# Patient Record
Sex: Female | Born: 1935 | Race: White | Hispanic: No | Smoking: Never smoker
Health system: Southern US, Community
[De-identification: ages and names within clinical notes are randomized; demographics above are authoritative.]

## PROBLEM LIST (undated history)

## (undated) DIAGNOSIS — K449 Diaphragmatic hernia without obstruction or gangrene: Secondary | ICD-10-CM

## (undated) DIAGNOSIS — B9681 Helicobacter pylori [H. pylori] as the cause of diseases classified elsewhere: Secondary | ICD-10-CM

## (undated) DIAGNOSIS — M48061 Spinal stenosis, lumbar region without neurogenic claudication: Secondary | ICD-10-CM

## (undated) DIAGNOSIS — D131 Benign neoplasm of stomach: Secondary | ICD-10-CM

## (undated) DIAGNOSIS — B029 Zoster without complications: Secondary | ICD-10-CM

## (undated) DIAGNOSIS — M1990 Unspecified osteoarthritis, unspecified site: Secondary | ICD-10-CM

## (undated) DIAGNOSIS — C801 Malignant (primary) neoplasm, unspecified: Secondary | ICD-10-CM

## (undated) DIAGNOSIS — K219 Gastro-esophageal reflux disease without esophagitis: Secondary | ICD-10-CM

## (undated) DIAGNOSIS — I10 Essential (primary) hypertension: Secondary | ICD-10-CM

## (undated) DIAGNOSIS — E119 Type 2 diabetes mellitus without complications: Secondary | ICD-10-CM

## (undated) DIAGNOSIS — F32A Depression, unspecified: Secondary | ICD-10-CM

## (undated) DIAGNOSIS — K2970 Gastritis, unspecified, without bleeding: Secondary | ICD-10-CM

## (undated) DIAGNOSIS — D126 Benign neoplasm of colon, unspecified: Secondary | ICD-10-CM

## (undated) HISTORY — PX: OTHER SURGICAL HISTORY: SHX169

## (undated) HISTORY — DX: Benign neoplasm of colon, unspecified: D12.6

## (undated) HISTORY — PX: LUMBAR LAMINECTOMY: SHX95

## (undated) HISTORY — PX: RETINAL LASER PROCEDURE: SHX2339

## (undated) HISTORY — DX: Benign neoplasm of stomach: D13.1

## (undated) HISTORY — DX: Depression, unspecified: F32.A

## (undated) HISTORY — DX: Type 2 diabetes mellitus without complications: E11.9

## (undated) HISTORY — PX: BREAST EXCISIONAL BIOPSY: SUR124

## (undated) HISTORY — DX: Gastro-esophageal reflux disease without esophagitis: K21.9

## (undated) HISTORY — PX: TOTAL KNEE ARTHROPLASTY: SHX125

## (undated) HISTORY — PX: VAGINAL HYSTERECTOMY: SUR661

## (undated) HISTORY — DX: Zoster without complications: B02.9

## (undated) HISTORY — DX: Unspecified osteoarthritis, unspecified site: M19.90

## (undated) HISTORY — PX: TONSILLECTOMY: SUR1361

## (undated) HISTORY — DX: Essential (primary) hypertension: I10

## (undated) HISTORY — PX: ROTATOR CUFF REPAIR: SHX139

## (undated) HISTORY — DX: Helicobacter pylori (H. pylori) as the cause of diseases classified elsewhere: B96.81

## (undated) HISTORY — PX: CARPAL TUNNEL RELEASE: SHX101

## (undated) HISTORY — PX: HEMORRHOID SURGERY: SHX153

## (undated) HISTORY — PX: CATARACT EXTRACTION: SUR2

## (undated) HISTORY — DX: Spinal stenosis, lumbar region without neurogenic claudication: M48.061

## (undated) HISTORY — DX: Gastritis, unspecified, without bleeding: K29.70

## (undated) SURGERY — Surgical Case
Anesthesia: *Unknown

---

## 1996-10-16 HISTORY — PX: CHOLECYSTECTOMY: SHX55

## 1998-06-26 ENCOUNTER — Inpatient Hospital Stay (HOSPITAL_COMMUNITY): Admission: RE | Admit: 1998-06-26 | Discharge: 1998-06-28 | Payer: Self-pay | Admitting: Orthopedic Surgery

## 1998-08-31 ENCOUNTER — Other Ambulatory Visit: Admission: RE | Admit: 1998-08-31 | Discharge: 1998-08-31 | Payer: Self-pay | Admitting: Internal Medicine

## 1999-09-14 ENCOUNTER — Encounter: Admission: RE | Admit: 1999-09-14 | Discharge: 1999-09-14 | Payer: Self-pay | Admitting: Internal Medicine

## 1999-09-14 ENCOUNTER — Encounter: Payer: Self-pay | Admitting: Internal Medicine

## 1999-09-22 ENCOUNTER — Other Ambulatory Visit: Admission: RE | Admit: 1999-09-22 | Discharge: 1999-09-22 | Payer: Self-pay | Admitting: Internal Medicine

## 2000-09-14 ENCOUNTER — Encounter: Admission: RE | Admit: 2000-09-14 | Discharge: 2000-09-14 | Payer: Self-pay | Admitting: Internal Medicine

## 2000-09-14 ENCOUNTER — Encounter: Payer: Self-pay | Admitting: Internal Medicine

## 2000-09-18 ENCOUNTER — Other Ambulatory Visit: Admission: RE | Admit: 2000-09-18 | Discharge: 2000-09-18 | Payer: Self-pay | Admitting: Internal Medicine

## 2000-11-23 ENCOUNTER — Encounter (INDEPENDENT_AMBULATORY_CARE_PROVIDER_SITE_OTHER): Payer: Self-pay

## 2000-11-23 ENCOUNTER — Ambulatory Visit (HOSPITAL_COMMUNITY): Admission: RE | Admit: 2000-11-23 | Discharge: 2000-11-23 | Payer: Self-pay | Admitting: *Deleted

## 2001-04-01 ENCOUNTER — Ambulatory Visit (HOSPITAL_COMMUNITY): Admission: RE | Admit: 2001-04-01 | Discharge: 2001-04-02 | Payer: Self-pay | Admitting: Ophthalmology

## 2001-04-01 ENCOUNTER — Encounter: Payer: Self-pay | Admitting: Ophthalmology

## 2001-10-22 ENCOUNTER — Encounter: Payer: Self-pay | Admitting: Ophthalmology

## 2001-10-23 ENCOUNTER — Ambulatory Visit (HOSPITAL_COMMUNITY): Admission: RE | Admit: 2001-10-23 | Discharge: 2001-10-24 | Payer: Self-pay | Admitting: Ophthalmology

## 2001-12-09 ENCOUNTER — Encounter: Admission: RE | Admit: 2001-12-09 | Discharge: 2001-12-09 | Payer: Self-pay | Admitting: Internal Medicine

## 2001-12-09 ENCOUNTER — Encounter: Payer: Self-pay | Admitting: Internal Medicine

## 2001-12-17 ENCOUNTER — Other Ambulatory Visit: Admission: RE | Admit: 2001-12-17 | Discharge: 2001-12-17 | Payer: Self-pay | Admitting: Internal Medicine

## 2003-01-20 ENCOUNTER — Encounter: Payer: Self-pay | Admitting: Internal Medicine

## 2003-01-20 ENCOUNTER — Encounter: Admission: RE | Admit: 2003-01-20 | Discharge: 2003-01-20 | Payer: Self-pay | Admitting: Internal Medicine

## 2003-01-27 ENCOUNTER — Other Ambulatory Visit: Admission: RE | Admit: 2003-01-27 | Discharge: 2003-01-27 | Payer: Self-pay | Admitting: Internal Medicine

## 2003-06-24 ENCOUNTER — Encounter: Payer: Self-pay | Admitting: Orthopedic Surgery

## 2003-06-25 ENCOUNTER — Observation Stay (HOSPITAL_COMMUNITY): Admission: RE | Admit: 2003-06-25 | Discharge: 2003-06-26 | Payer: Self-pay | Admitting: Orthopedic Surgery

## 2003-10-26 ENCOUNTER — Encounter: Admission: RE | Admit: 2003-10-26 | Discharge: 2003-10-26 | Payer: Self-pay | Admitting: Orthopedic Surgery

## 2003-11-10 ENCOUNTER — Encounter: Admission: RE | Admit: 2003-11-10 | Discharge: 2003-11-10 | Payer: Self-pay | Admitting: Orthopedic Surgery

## 2003-12-01 ENCOUNTER — Encounter: Admission: RE | Admit: 2003-12-01 | Discharge: 2003-12-01 | Payer: Self-pay | Admitting: Orthopedic Surgery

## 2003-12-31 ENCOUNTER — Inpatient Hospital Stay (HOSPITAL_COMMUNITY): Admission: RE | Admit: 2003-12-31 | Discharge: 2004-01-02 | Payer: Self-pay | Admitting: Orthopedic Surgery

## 2004-01-26 ENCOUNTER — Encounter (INDEPENDENT_AMBULATORY_CARE_PROVIDER_SITE_OTHER): Payer: Self-pay | Admitting: Specialist

## 2004-01-26 ENCOUNTER — Ambulatory Visit (HOSPITAL_COMMUNITY): Admission: RE | Admit: 2004-01-26 | Discharge: 2004-01-26 | Payer: Self-pay | Admitting: *Deleted

## 2004-03-08 ENCOUNTER — Ambulatory Visit (HOSPITAL_COMMUNITY): Admission: RE | Admit: 2004-03-08 | Discharge: 2004-03-08 | Payer: Self-pay | Admitting: Internal Medicine

## 2004-10-28 ENCOUNTER — Encounter: Admission: RE | Admit: 2004-10-28 | Discharge: 2004-10-28 | Payer: Self-pay | Admitting: Orthopedic Surgery

## 2004-12-13 ENCOUNTER — Observation Stay (HOSPITAL_COMMUNITY): Admission: RE | Admit: 2004-12-13 | Discharge: 2004-12-14 | Payer: Self-pay | Admitting: Orthopedic Surgery

## 2005-01-09 ENCOUNTER — Encounter: Admission: RE | Admit: 2005-01-09 | Discharge: 2005-01-09 | Payer: Self-pay | Admitting: Orthopedic Surgery

## 2005-04-08 ENCOUNTER — Encounter: Admission: RE | Admit: 2005-04-08 | Discharge: 2005-04-08 | Payer: Self-pay | Admitting: Orthopedic Surgery

## 2005-04-10 ENCOUNTER — Ambulatory Visit (HOSPITAL_COMMUNITY): Admission: RE | Admit: 2005-04-10 | Discharge: 2005-04-10 | Payer: Self-pay | Admitting: Internal Medicine

## 2005-06-13 IMAGING — CT CT L SPINE W/ CM
2 of 7 series · 10 of 20 positions shown, 12 images · non-contrast
Comparison: none

CLINICAL DATA: Back pain. 
 CT MULTIPLANAR RECONSTRUCTION - LUMBAR SPINE
 Multiplanar reformatted CT images were reconstructed from the axial CT data set. These images were reviewed and pertinent findings are included in the accompanying complete CT report. 

 IMPRESSION
 See complete CT report. 
 CT LUMBAR SPINE WITHOUT CONTRAST
TECHNIQUE: Initially lumbar myelography was attempted.  Attempts were made at the L3-4 and L2-3 levels.  After attempting at these levels, the patient could tolerate no further attempts at access into the intrathecal space.  Myelography was then abandoned and CT of the lumbar spine was performed without intrathecal contrast.  1 mm spiral images were obtained through the lumbar spine.  The images were reconstructed in the sagittal view as well as the axial views in the planes of the disks.

[Series 3: recon 2: · axial · 0.27mm/px · z∈[-9,+121]mm · 7 of 269 slices shown, 9 images]
[im 34/269  soft-tissue]
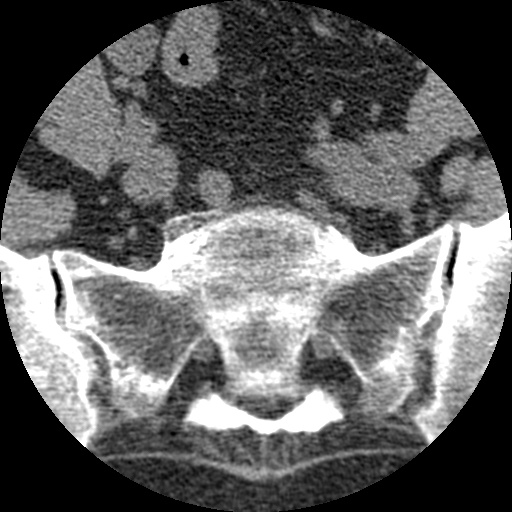
[im 34/269  bone]
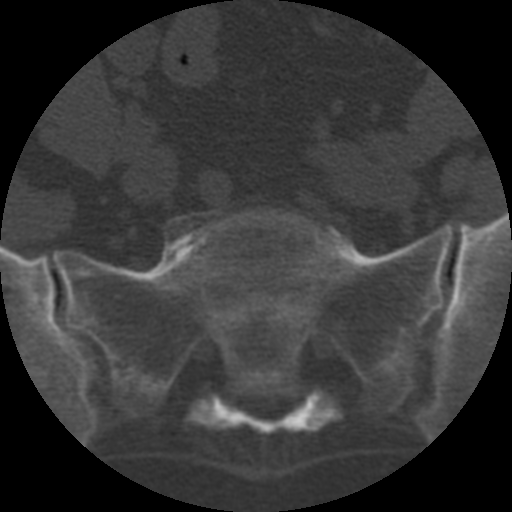
[im 68/269  bone]
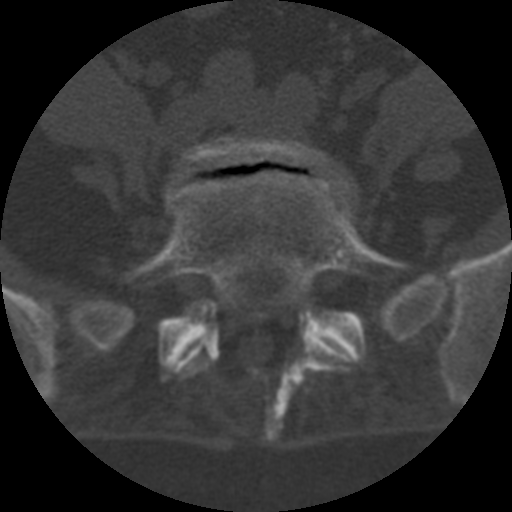
[im 101/269  bone]
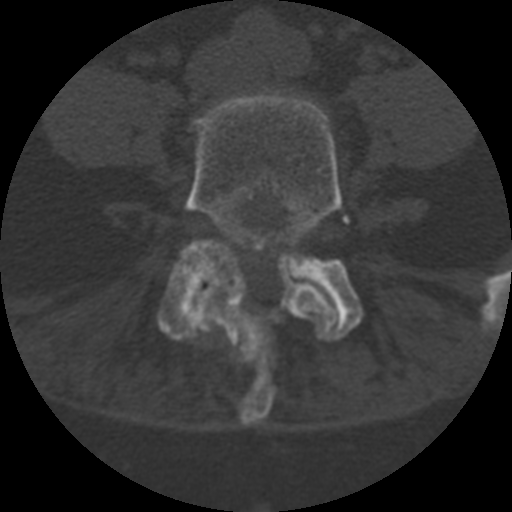
[im 135/269  bone]
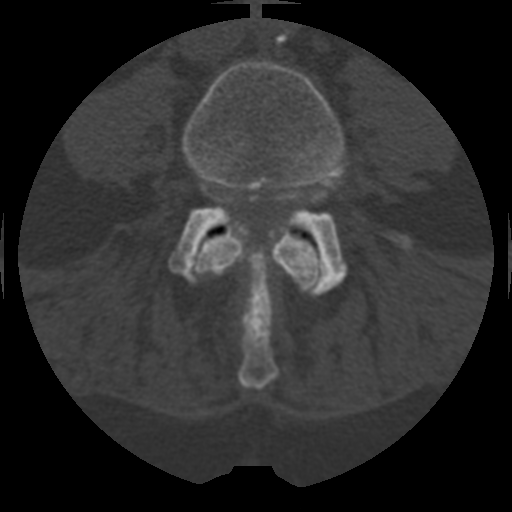
[im 168/269  soft-tissue]
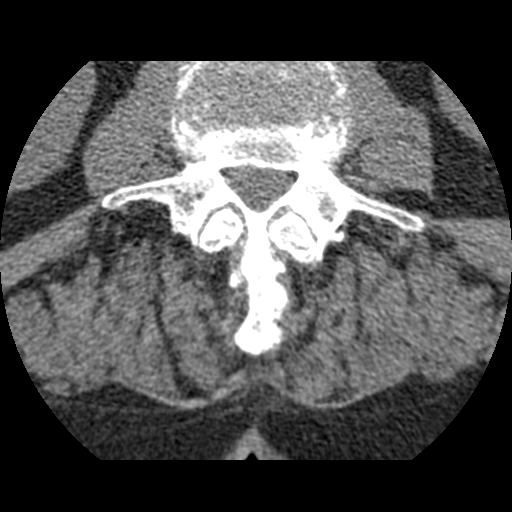
[im 168/269  bone]
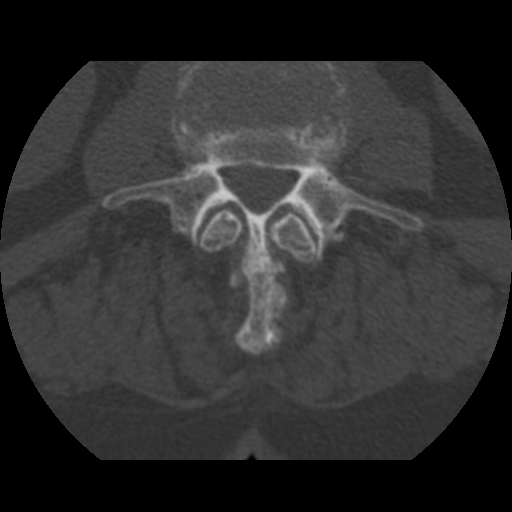
[im 202/269  bone]
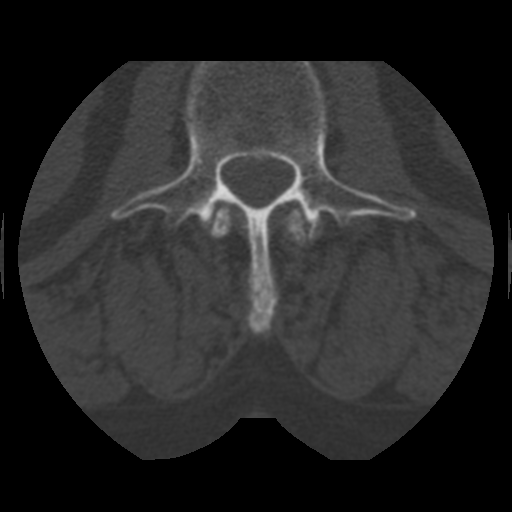
[im 235/269  bone]
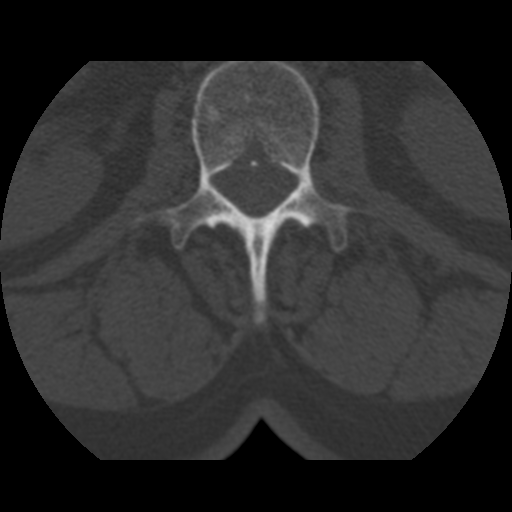

[Series 527: reformatted · sagittal · 0.35mm/px · 3 of 40 slices shown]
[im 8/40  bone]
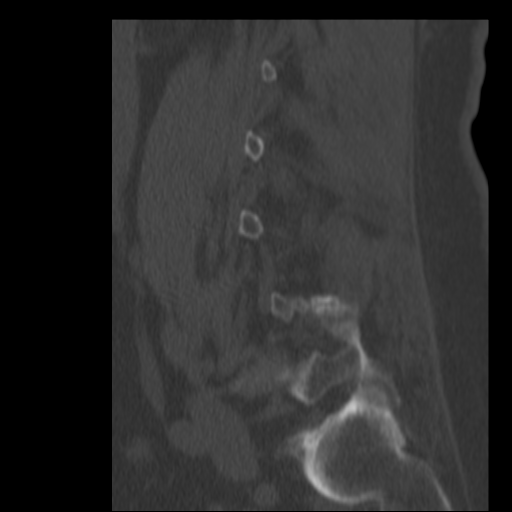
[im 16/40  bone]
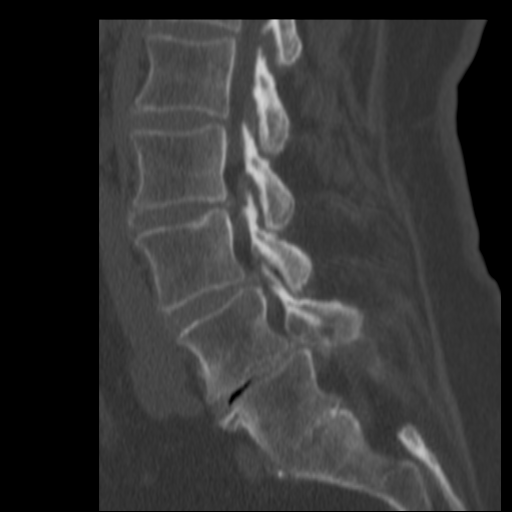
[im 24/40  bone]
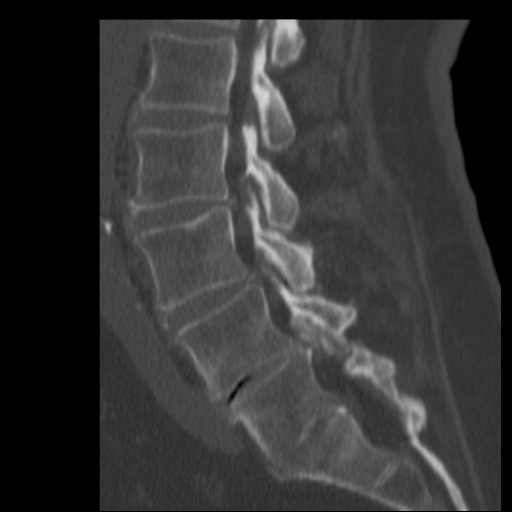

[10 of 20 positions shown; findings below may reference images not displayed]

FINDINGS: Mild narrowing at L1-2, L2-3, and L3-4 is seen.  Severe narrowing at L4-5 with vacuum disk phenomenon is seen.  Mild anterolisthesis of L4 upon L5 is seen approximately 2 to 3 mm.  L5-S1 is fused compatible with a transitional L5 vertebral body.  There is no vertebral body height loss in the lumbar spine.  The conus is not visualized.  
 L1-2:  No disk herniation, canal stenosis or neural foraminal stenosis.
 L2-3:  A significant concentric disk bulge is seen.  The disk is calcified with posterior osteophytic ridging.  There is also significant facet hypertrophy.  This results in severe multifactorial central canal stenosis as well as significant bilateral lateral recess stenosis.  Moderate bilateral neural foraminal stenosis is seen.  
 L3-4:  There is a moderate concentric disk bulge with bilateral facet hypertrophy.  This results in moderate to severe central canal stenosis and moderate bilateral lateral recess stenosis.  The disk is also somewhat peripherally calcified, but to a lesser degree than that of the L2-3 disk.  The central canal stenosis is somewhat less severe than at the L2-3 level.  Mild bilateral neural foraminal stenosis is seen.  
 L4-5:  There is severe desiccation of the disk.   There is also significant bilateral facet arthropathy.  This results in a severe right subarticular lateral recess stenosis and moderate left lateral recess stenosis.  Mild central canal stenosis is seen.  Moderate left neural foraminal stenosis is noted and severe right neural foraminal stenosis is seen predominantly due to disk osteophyte and facet osteophytes.  A left laminectomy defect is noted.
 L5-S1:  There is a mild concentric disk bulge.  A right laminectomy defect is noted. Significant facet arthropathy is noted bilaterally.  There is no central canal stenosis.  Moderate bilateral neural foraminal stenosis is noted.  
 IMPRESSION
 1.  Transitional vertebra at L5 as described.
 2.  Significant central canal stenosis and bilateral lateral recess stenosis at L2-3 and L3-4.  Bilateral neural foraminal stenosis
 is noted at these levels as well.
 3.  Minimal central canal stenosis at L4-5, but severe right neural foraminal stenosis and moderate left neural foraminal stenosis is a result of concentric disk bulge and facet arthropathy.
 4.  Post surgical changes. 
 5.  Compared to the MR, there has been no significant change.  The right L4-5 neural foraminal stenosis is better delineated due to calcification in the adjacent disk and facet osteophytes.

## 2005-07-26 ENCOUNTER — Ambulatory Visit: Admission: RE | Admit: 2005-07-26 | Discharge: 2005-07-26 | Payer: Self-pay | Admitting: *Deleted

## 2005-07-26 ENCOUNTER — Encounter (INDEPENDENT_AMBULATORY_CARE_PROVIDER_SITE_OTHER): Payer: Self-pay | Admitting: *Deleted

## 2005-10-18 ENCOUNTER — Inpatient Hospital Stay (HOSPITAL_COMMUNITY): Admission: RE | Admit: 2005-10-18 | Discharge: 2005-10-24 | Payer: Self-pay | Admitting: Orthopaedic Surgery

## 2006-03-12 ENCOUNTER — Emergency Department (HOSPITAL_COMMUNITY): Admission: EM | Admit: 2006-03-12 | Discharge: 2006-03-12 | Payer: Self-pay | Admitting: Internal Medicine

## 2006-05-21 ENCOUNTER — Ambulatory Visit (HOSPITAL_COMMUNITY): Admission: RE | Admit: 2006-05-21 | Discharge: 2006-05-21 | Payer: Self-pay | Admitting: Internal Medicine

## 2007-05-23 ENCOUNTER — Ambulatory Visit (HOSPITAL_COMMUNITY): Admission: RE | Admit: 2007-05-23 | Discharge: 2007-05-23 | Payer: Self-pay | Admitting: Internal Medicine

## 2007-06-24 ENCOUNTER — Encounter: Admission: RE | Admit: 2007-06-24 | Discharge: 2007-06-24 | Payer: Self-pay | Admitting: Orthopaedic Surgery

## 2007-06-26 ENCOUNTER — Other Ambulatory Visit: Admission: RE | Admit: 2007-06-26 | Discharge: 2007-06-26 | Payer: Self-pay | Admitting: Gynecology

## 2007-06-28 ENCOUNTER — Ambulatory Visit (HOSPITAL_BASED_OUTPATIENT_CLINIC_OR_DEPARTMENT_OTHER): Admission: RE | Admit: 2007-06-28 | Discharge: 2007-06-28 | Payer: Self-pay | Admitting: Orthopaedic Surgery

## 2007-09-30 ENCOUNTER — Emergency Department (HOSPITAL_COMMUNITY): Admission: EM | Admit: 2007-09-30 | Discharge: 2007-09-30 | Payer: Self-pay | Admitting: Emergency Medicine

## 2008-05-26 ENCOUNTER — Encounter: Admission: RE | Admit: 2008-05-26 | Discharge: 2008-05-26 | Payer: Self-pay | Admitting: Orthopaedic Surgery

## 2008-06-05 ENCOUNTER — Encounter: Admission: RE | Admit: 2008-06-05 | Discharge: 2008-06-05 | Payer: Self-pay | Admitting: Orthopaedic Surgery

## 2008-06-25 ENCOUNTER — Ambulatory Visit (HOSPITAL_COMMUNITY): Admission: RE | Admit: 2008-06-25 | Discharge: 2008-06-25 | Payer: Self-pay | Admitting: Internal Medicine

## 2008-11-09 ENCOUNTER — Ambulatory Visit: Payer: Self-pay | Admitting: Gynecology

## 2009-05-01 ENCOUNTER — Encounter: Admission: RE | Admit: 2009-05-01 | Discharge: 2009-05-01 | Payer: Self-pay | Admitting: Orthopaedic Surgery

## 2009-07-15 ENCOUNTER — Ambulatory Visit (HOSPITAL_COMMUNITY): Admission: RE | Admit: 2009-07-15 | Discharge: 2009-07-15 | Payer: Self-pay | Admitting: Internal Medicine

## 2009-10-22 ENCOUNTER — Inpatient Hospital Stay (HOSPITAL_COMMUNITY): Admission: RE | Admit: 2009-10-22 | Discharge: 2009-10-27 | Payer: Self-pay | Admitting: Orthopaedic Surgery

## 2009-12-21 ENCOUNTER — Ambulatory Visit: Payer: Self-pay | Admitting: Gynecology

## 2009-12-21 ENCOUNTER — Other Ambulatory Visit: Admission: RE | Admit: 2009-12-21 | Discharge: 2009-12-21 | Payer: Self-pay | Admitting: Gynecology

## 2010-02-22 ENCOUNTER — Ambulatory Visit: Payer: Self-pay | Admitting: Gynecology

## 2010-07-21 ENCOUNTER — Ambulatory Visit (HOSPITAL_COMMUNITY): Admission: RE | Admit: 2010-07-21 | Discharge: 2010-07-21 | Payer: Self-pay | Admitting: Internal Medicine

## 2010-09-16 ENCOUNTER — Encounter: Admission: RE | Admit: 2010-09-16 | Discharge: 2010-09-16 | Payer: Self-pay | Admitting: Orthopaedic Surgery

## 2010-12-08 ENCOUNTER — Other Ambulatory Visit: Payer: Self-pay | Admitting: *Deleted

## 2010-12-08 ENCOUNTER — Other Ambulatory Visit: Payer: Self-pay | Admitting: Rehabilitation

## 2010-12-08 ENCOUNTER — Other Ambulatory Visit: Payer: Self-pay | Admitting: Orthopedic Surgery

## 2010-12-08 DIAGNOSIS — M549 Dorsalgia, unspecified: Secondary | ICD-10-CM

## 2010-12-13 ENCOUNTER — Ambulatory Visit
Admission: RE | Admit: 2010-12-13 | Discharge: 2010-12-13 | Disposition: A | Discharge status: Home / Self Care | Payer: Medicare Other | Source: Ambulatory Visit | Attending: Orthopedic Surgery | Admitting: Orthopedic Surgery

## 2010-12-13 DIAGNOSIS — M549 Dorsalgia, unspecified: Secondary | ICD-10-CM

## 2010-12-31 LAB — BASIC METABOLIC PANEL
BUN: 11 mg/dL (ref 6–23)
BUN: 9 mg/dL (ref 6–23)
CO2: 27 mEq/L (ref 19–32)
CO2: 28 mEq/L (ref 19–32)
Calcium: 9 mg/dL (ref 8.4–10.5)
Chloride: 104 mEq/L (ref 96–112)
Creatinine, Ser: 0.68 mg/dL (ref 0.4–1.2)
Creatinine, Ser: 0.74 mg/dL (ref 0.4–1.2)
GFR calc Af Amer: >60 mL/min (ref >60)
GFR calc non Af Amer: >60 mL/min (ref >60)
Glucose, Bld: 145 mg/dL — ABNORMAL HIGH (ref 70–99)
Glucose, Bld: 156 mg/dL — ABNORMAL HIGH (ref 70–99)
Potassium: 4 mEq/L (ref 3.5–5.1)
Potassium: 4.4 mEq/L (ref 3.5–5.1)

## 2010-12-31 LAB — PROTIME-INR
INR: 1.03 (ref 0.00–1.49)
INR: 1.17 (ref 0.00–1.49)
INR: 1.81 — ABNORMAL HIGH (ref 0.00–1.49)
INR: 1.95 — ABNORMAL HIGH (ref 0.00–1.49)
INR: 2.12 — ABNORMAL HIGH (ref 0.00–1.49)
Prothrombin Time: 13.4 seconds (ref 11.6–15.2)
Prothrombin Time: 14.8 seconds (ref 11.6–15.2)
Prothrombin Time: 22.1 seconds — ABNORMAL HIGH (ref 11.6–15.2)

## 2010-12-31 LAB — URINALYSIS, ROUTINE W REFLEX MICROSCOPIC
Hgb urine dipstick: NEGATIVE
Nitrite: NEGATIVE
Specific Gravity, Urine: 1.015 (ref 1.005–1.030)
Urobilinogen, UA: 0.2 mg/dL (ref 0.0–1.0)
pH: 5.5 (ref 5.0–8.0)

## 2010-12-31 LAB — CBC
HCT: 29.9 % — ABNORMAL LOW (ref 36.0–46.0)
Hemoglobin: 10.4 g/dL — ABNORMAL LOW (ref 12.0–15.0)
Hemoglobin: 13.9 g/dL (ref 12.0–15.0)
MCHC: 33.5 g/dL (ref 30.0–36.0)
MCHC: 33.7 g/dL (ref 30.0–36.0)
MCHC: 33.7 g/dL (ref 30.0–36.0)
MCV: 90 fL (ref 78.0–100.0)
MCV: 90.1 fL (ref 78.0–100.0)
Platelets: 172 10*3/uL (ref 150–400)
RBC: 3.44 MIL/uL — ABNORMAL LOW (ref 3.87–5.11)
RBC: 3.72 MIL/uL — ABNORMAL LOW (ref 3.87–5.11)
RBC: 4.62 MIL/uL (ref 3.87–5.11)
RDW: 13.7 % (ref 11.5–15.5)
WBC: 8.8 10*3/uL (ref 4.0–10.5)

## 2010-12-31 LAB — COMPREHENSIVE METABOLIC PANEL
ALT: 22 U/L (ref 0–35)
AST: 23 U/L (ref 0–37)
Albumin: 4.2 g/dL (ref 3.5–5.2)
Calcium: 9.9 mg/dL (ref 8.4–10.5)
GFR calc Af Amer: >60 mL/min (ref >60)
Sodium: 141 mEq/L (ref 135–145)
Total Protein: 7 g/dL (ref 6.0–8.3)

## 2010-12-31 LAB — GLUCOSE, CAPILLARY
Glucose-Capillary: 157 mg/dL — ABNORMAL HIGH (ref 70–99)
Glucose-Capillary: 170 mg/dL — ABNORMAL HIGH (ref 70–99)
Glucose-Capillary: 175 mg/dL — ABNORMAL HIGH (ref 70–99)
Glucose-Capillary: 176 mg/dL — ABNORMAL HIGH (ref 70–99)
Glucose-Capillary: 183 mg/dL — ABNORMAL HIGH (ref 70–99)
Glucose-Capillary: 200 mg/dL — ABNORMAL HIGH (ref 70–99)
Glucose-Capillary: 235 mg/dL — ABNORMAL HIGH (ref 70–99)
Glucose-Capillary: 276 mg/dL — ABNORMAL HIGH (ref 70–99)

## 2010-12-31 LAB — APTT: aPTT: 28 seconds (ref 24–37)

## 2011-02-28 NOTE — Op Note (Signed)
NAMEMarland [name]  Darlene, [name] NO.:  [id number]   MEDICAL RECORD NO.:  [id number]          PATIENT TYPE:  AMB   LOCATION:  DSC                          FACILITY:  [facility]   PHYSICIAN:  Vanita Panda. Magnus Ivan, M.D.DATE OF BIRTH:  Dec 29, 1927   DATE OF PROCEDURE:  06/28/2007  DATE OF DISCHARGE:                               OPERATIVE REPORT   PREOPERATIVE DIAGNOSIS:  Left knee effusion, pain, and severe  degenerative joint disease.   POSTOPERATIVE DIAGNOSIS:  1. 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  2. Chronic posterior horn medial meniscal tear  3. 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   PROCEDURE:  1. Left knee arthroscopy with debridement.  2. Partial medial meniscectomy, left knee.  3. Chondroplasty medial femoral condyle of patella.   SURGEON:  Vanita Panda. Magnus Ivan, M.D.   ANESTHESIA:  General.   BLOOD LOSS:  Minimal.   COMPLICATIONS:  None.   INDICATIONS:  Briefly, [name] is a 77 year old female who had  undergone two previous knee arthroscopies many years ago of her left  knee.  She continued to have a medial sided knee pain with locking and  catching. X-rays still showed a well maintained joint space.  However,  with the two arthroscopic surgeries and the amount of pain and swelling  she is having total, I told her most likely she would need a total knee  replacement.  She said she had done well with arthroscopic surgery in  the past and really did not want to proceed with total knee surgery.  I  have tried conservative treatment with anti-inflammatories and  injections in the knee and the pain is worsened and she is now  presenting for operative intervention of the knee.   DESCRIPTION OF PROCEDURE:  After informed consent was obtained and the  appropriate left leg was marked, she was brought to the operating room  and placed supine on the operating table.  General anesthesia was then  obtained.  Her left leg was prepped  and draped with DuraPrep and sterile  drapes including sterile stockinette.  A nonsterile tourniquet was  placed on her upper thigh but was never utilized during the case.  A  time out was called to identify the correct patient and correct  extremity.  Then, with the lateral leg post and the bed raised and the  left knee flexed off the side of the table, I made an anterior lateral  portal at the level of the inferior pole of the patella just lateral to  the patellar tendon.  The arthroscope was inserted and effusion was  encountered.  I then went to the medial side and made an anterior medial  portal at the same level.  You could see there was a large area of  cartilage loss and frayed edges and loose edges of cartilage on the  medial femoral condyle.  I put an arthroscopic shaver into the knee and  carried out a partial chondroplasty of loose edges of cartilage to get  these back to a stable margin, but there was, again, a large area of  complete  cartilage loss. The medial meniscus had degenerative tearing  from the mid body all the way to the posterior horn with large fraying  areas of this and I performed a partial medial meniscectomy with up  cutting biters as well as arthroscopic shaver to get this to a stable  margin.  I went to the ACL and probed it and it had degenerative tearing  of the ACL and I debrided this back. The lateral compartment was  assessed, the lateral meniscus and cartilage was pristine with minimal  degenerative changes.  Finally, with the knee extended, I went to the  patellofemoral joint and found grade 2 to grade 3 change of the patella,  but minimal of the trochlea.  An arthroscopic shaver was used to carry  debridement in this area.  I then allowed fluid to lavage the knee.  All  instrumentation was then removed.  I infiltrated 0.25% plain Marcaine  epinephrine mixture into the knee and into the arthroscopy portal sites.  The portal sites were closed with  interrupted 4-0 nylon suture.  Xeroform followed by a well padded sterile dressing were applied.  The  patient was awakened, extubated, and taken to the recovery room in  stable condition.   Postoperatively, I will have her start weight bearing as tolerated on  this knee and use it as much as possible.  She will resume anti-  inflammatories and a physical therapy regimen, as well.      Vanita Panda. Magnus Ivan, M.D.  Electronically Signed     CYB/MEDQ  D:  06/28/2007  T:  06/28/2007  Job:  [id number]

## 2011-03-03 NOTE — Op Note (Signed)
NAMEELVY, MCLARTY NO.:  [id number]   MEDICAL RECORD NO.:  [id number]          PATIENT TYPE:  AMB   LOCATION:  DFTL                         FACILITY:  [facility]   PHYSICIAN:  Georgiana Spinner, M.D.    DATE OF BIRTH:  May 16, 1928   DATE OF PROCEDURE:  07/26/2005  DATE OF DISCHARGE:                                 OPERATIVE REPORT   PROCEDURE:  Upper endoscopy with dilation and biopsy.   INDICATIONS:  Dysphagia.   ANESTHESIA:  Demerol 70, Versed 8 mg.   DESCRIPTION OF PROCEDURE:  With the patient mildly sedated in the left  lateral decubitus position, the Olympus videoscopic endoscope was advanced  in the mouth, passed under direct vision into the esophagus which appeared  normal. There was scalloping of the squamocolumnar junction but I could not  clearly see Barrett's at this point. I entered into the stomach. The fundus,  body, antrum were visualized and the body appeared quite erythematous and  was biopsied. The duodenal bulb and second portion of the duodenum were  visualized and they appeared normal. From this point, the endoscope was  slowly withdrawn taking circumferential views of the duodenal mucosa until  the endoscope had been pulled back in the stomach placed in retroflexion to  view the stomach from below. The endoscope was then straightened and a  guidewire was passed. The endoscope was withdrawn taking circumferential  views of the remaining gastric and esophageal mucosa. Subsequently over the  guidewire was passed a Savary dilator, 15 and 17 Savary with minimal  resistance. Fluoroscopic control was provided. The endoscope was then  reinserted after the guidewire was removed and we biopsied the distal  esophagus to rule out Barrett's and we entered into the stomach, viewed this  area, decompressed it and withdrew the endoscope taking circumferential  views of the remaining gastric and esophageal mucosa. The patient's vital  signs and pulse oximeter  remained stable. The patient tolerated the  procedure well without apparent complications.   FINDINGS:  Erythematous body of stomach biopsied and biopsy of the distal  esophagus, dilated distal esophagus to 15 and 17 Savary.   PLAN:  Await clinical response and biopsy reports. The patient will call me  for results and follow-up with me as an outpatient.           ______________________________  Georgiana Spinner, M.D.     GMO/MEDQ  D:  07/26/2005  T:  07/26/2005  Job:  [id number]

## 2011-03-03 NOTE — H&P (Signed)
NAME:  [name], [name]                        ACCOUNT NO.:  [id number]   MEDICAL RECORD NO.:  [id number]                   PATIENT TYPE:  INP   LOCATION:  NA                                   FACILITY:  [facility]   PHYSICIAN:  Marlowe Kays, M.D.               DATE OF BIRTH:  Mar 19, 1928   DATE OF ADMISSION:  12/31/2003  DATE OF DISCHARGE:                                HISTORY & PHYSICAL   CHIEF COMPLAINT:  Pain in my left hip and leg.   HISTORY OF PRESENT ILLNESS:  This is a 77 year old lady seen by [place] for  continued progressive problems concerning her low back. She has had two  lumbar laminectomies in the past, one about 20 years ago and one about ten  years ago. Examination revealed straight leg raise on the left which is  negative. This was seen to be bilaterally as well. Motor strength and  sensation are intact to lower extremities. It is felt that when this picture  with lower extremity pain and little back pain is probably a stenosis of  lumbar spine. The patient had a myelogram CT scan on December 01, 2003, and  she has near complete spinal blockage at L2-3 and L3-4.  This is a very  active lady and it is felt she would benefit from surgical intervention,  being admitted for central decompressive lumbar laminectomy at L2-3, L3-4.   PAST MEDICAL HISTORY:  Medically, his lady has been in relatively good  health. Dr. Dimas Alexandria is her family physician.  She has no allergies.  She has gouty arthritis, non-insulin-dependent diabetes mellitus, reflux,  and hypertension. Her past surgeries include tonsillectomy in the past,  hemorrhoidectomy, breast biopsy (biopsy), bilateral carpal tunnel,  hysterectomy, cholecystectomy, bilateral rotator cuff repairs, lumbar  laminectomy (one 20 years ago and one 10 years ago).   CURRENT MEDICATIONS:  1. Allopurinol 300 mg one daily.  2. Amaryl 0.2 mg two a day.  3. Avandia 4 mg one-half b.i.d.  4. Norvasc 5 mg one daily.  5. Altace 10  mg one daily.  6. Hydrochlorothiazide 25 mg one daily.  7. Protonix 40 mg one daily.   FAMILY HISTORY:  Positive for heart disease, hypertension, diabetes, and  cancer.   SOCIAL HISTORY:  The patient is retired; has no intake of ETOH or tobacco  products. Her sister will help with her caregiving after surgery.   REVIEW OF SYSTEMS:  CNS: No seizure disorder, paralysis, tinnitus, or double  vision. The patient does have burning and tingling into the left lower  extremity. CARDIOVASCULAR: No chest pain, no angina, and no orthopnea.  RESPIRATORY: No productive cough, no hemoptysis, no shortness of breath.  GASTROINTESTINAL:  No nausea, vomiting, melena. GENITOURINARY: No dysuria,  dysuria, or hematuria. MUSCULOSKELETAL: Primarily in present illness.   HISTORY OF PRESENT ILLNESS:  GENERAL: Alert, cooperative, and friendly 18-  year-old white female looking younger than her stated age.  VITAL SIGNS: Blood pressure 156/68, pulse 88, respirations 12.  HEENT: Normocephalic. PERRLA. EOMs intact. Oropharynx is clear.  CHEST: Clear to auscultation with no rales or rhonchi.  HEART: Regular rate and rhythm with no murmurs.  ABDOMEN: Soft and nontender. Liver and spleen not felt.  GENITALIA/RECTAL/PELVIC/BREASTS: Not done, not pertinent for this illness.  EXTREMITIES: As in present illness above.   ADMISSION DIAGNOSIS:  1. Spinal stenosis, L2-3.  2. Gout.  3. Type 2 diabetes.  4. Hypertension.  5. Reflux.   PLAN:  The patient will undergo central decompression of lumbar laminectomy,  L2-3, L3-4. Should she have any medical problems will certainly ask the  representative, Dr. Higinio Plan, to follow along with [place] while the patient  is in the hospital.     Terie Purser L. Cherlynn June.                 Marlowe Kays, M.D.    DLU/MEDQ  D:  12/24/2003  T:  12/24/2003  Job:  [id number]   cc:   Janae Bridgeman. Eloise Harman., M.D.  [address] [place] [address]  [place]  [place] [address]  Fax: [phone]

## 2011-03-03 NOTE — Op Note (Signed)
[name], [name]                        ACCOUNT NO.:  [id number]   MEDICAL RECORD NO.:  [id number]                   PATIENT TYPE:  INP   LOCATION:  0004                                 FACILITY:  [facility]   PHYSICIAN:  Marlowe Kays, M.D.               DATE OF BIRTH:  [birth year]   DATE OF PROCEDURE:  12/31/2003  DATE OF DISCHARGE:                                 OPERATIVE REPORT   PREOPERATIVE DIAGNOSES:  Severe spinal stenosis L2-3, L3-4 and probably L4-  5.   POSTOPERATIVE DIAGNOSES:  Severe spinal stenosis L2-3, L3-4 and L4-5.   OPERATION:  Central decompressive laminectomy L2-3, L3-4 and L4-5.   SURGEON:  Marlowe Kays, M.D.   ASSISTANT:  [name]. [name], M.D.   ANESTHESIA:  General.   PATHOLOGY AND JUSTIFICATION FOR PROCEDURE:  She is having back and bilateral  leg pain left greater than right. She had had previous surgery at L4-5 on  the right by history and L5-S1 on the left. An MRI indicated that the  surgery at L4-5 may have been on the left instead of the right as recorded  in the chart by one of my retired partners.  On myelogram, she had a  complete block at L3-4 and it was felt on contrast CT that she also had a  severe spinal stenosis at L4-5 as well.  At surgery, it was confirmed that  she did have a three level spinal stenosis.   DESCRIPTION OF PROCEDURE:  Satisfactory general anesthesia, prophylactic  antibiotics, prone position on the Bielak frame.  Her back was prepped with  Duraprep, draped in a sterile field, Ioban employed.  I made my initial skin  incision just above the proximal portion of the previous skin incision. The  two spinous processes at this level were identified, tagged with Kocher  clamps and a lateral x-ray taken.  She had some complex lumbar anatomy with  what appeared to be fibrosis at L5-S1. We had the radiologist assist [place] in  identifying the exact level with the picture being clear but the  identification of the level  needing clarification.  It was felt by the  reading radiologist that the Kocher clamps were on L2 and L3 spinous  processes and this is what we felt also. Based on this, I extended the  incision distally down to L5 and soft tissue was dissected off the neural  arches __________ side.  It appeared that she had had prior surgery at L4-5  on the left.  Self retaining McCullough retractors were placed. With the  double action rongeur, I removed the entire spinous process at L3 and L4,  the superior portion of L5 and the inferior portion of L2 with a portion of  the neural arches at all these levels as well.  Then with a combination of 2  and 3 mm Kerrison rongeurs and double action rongeurs assisted with a  headlight, we  did the major portion of the decompression.  The primary  problem appeared to be very thickened almost ossified ligamentum flavum at  L3-4.  We then brought in the microscope and completed the decompression  gently teasing the thickened ligamentum flavum off the dura and working  distally until the L3, L4 and L5 nerve roots were all thoroughly  decompressed in their foramen respectively bilaterally.  There were no  operative complications. The wound was irrigated well with sterile saline  and the dura covered with Gelfoam. I then placed a 1/4 inch Penrose drain  with a safety pin through the left posterior flank over the Gelfoam, removed  the self retaining retractors and corrected some minor bleeding.  The wound  was then closed with care not to sew in the drain with interrupted #1 Vicryl  in the paralumbar muscle and  fascia, a combination of #1 and 2-0 Vicryl in the subcutaneous tissue and  staples in the skin.  Betadine Adaptic dry sterile dressing were applied.  She tolerated the procedure well and was taken to the recovery room in  satisfactory condition with no known complications. Estimated blood loss 350  mL, no blood replacement.                                                Marlowe Kays, M.D.    JA/MEDQ  D:  12/31/2003  T:  01/01/2004  Job:  [id number]

## 2011-03-03 NOTE — Op Note (Signed)
[place]  Patient:    [name], [name]                     MRN: [id number] Proc. Date: 04/01/01 Adm. 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  POSTOPERATIVE DIAGNOSES:  Pars plana vitrectomy and membrane peeling, gas-fluid exchange with 16% C3F8, and peripheral panretinal photocoagulation, right eye.  SURGEON:  Ladona Ridgel, M.D.  ASSISTANT:  Alma Downs, P.A.  ANESTHESIA:  General endotracheal.  ESTIMATED BLOOD LOSS:  Less than 1 cc.  COMPLICATIONS:  None.  DESCRIPTION OF PROCEDURE:  The patient was taken to the operating room, where after induction of general anesthesia the right eye was prepped and draped in the usual fashion.  A lid speculum was introduced.  Conjunctival peritomy was developed temporally from 11 to 7 and nasally from 1 to 3 with relaxing incisions at 1 and 9.  Hemostasis was obtained with eraser cautery. Sclerotomies were fashioned 3 mm posterior to the limbus at 1:30, 10:30, and 8.  The superior sclerotomies were plugged, and a 4 mm infusion cannula was secured at the 8 oclock sclerotomy with a temporary suture of 7-0 Vicryl. The tip was visually inspected and found to be in good position.  A Landers lens ring was secured to the bulb with 7-0 Vicryl sutures at 3 and 9.  Plugs were removed and the 30 degree prismatic lens applied to the surface of the eye.  A central core, followed by a peripheral vitrectomy was performed. There was no posterior hyaloid separation.  Using the suction port of the vitrector, the posterior hyaloid was engaged over the optic nerve and stripped from the posterior segment.  This was quite adherent but came off in one large piece.   Once this had been separated, the peripheral vitrectomy was performed 360 degrees.  A magnifying flat lens then applied to the surface of the eye. Inspection of the macula showed there to be no residual cortical vitreous. The macula itself was quite elevated and cystic with a glistening surface to the retina.  An MVR blade was used to make incision to the internal limiting membrane/cellophane changes superior to the fovea.  The edges of the internal limiting membrane were then elevated with barb on the tip of the MVR blade. The membrane was then peeled off the superonasal macula with the Rice pick. The membrane was then grasped with the [facility] serrated forceps and the residual membrane peeled off the entire macula area.  It peeled off over the cystic fovea without making an apparent hole in the fovea itself.  The membrane was then removed.  The instruments were removed from the eye, and the hole was plugged.  The Landers lens ring removed.  Inspection with the indirect ophthalmoscope and scleral depression revealed there to be no peripheral retinal breaks or tears; however, there were multiple little tiny pinpoint hemorrhages.  Limited peripheral panretinal photocoagulation was then applied with the headscope laser.  The headscope was then used to direct the silicone-tipped catheter to remove the fluid from the eye and substitute it with air through the infusion cannula.  The C3F8 was drawn up in the  usual sterile fashion and diluted to a mixture of 16%.  The superonasal sclerotomy was permanently closed with 7-0 Vicryl and 20 cc of the filtered gas mixture infused through the infusion cannula, egressed through the superotemporal sclerotomy.  The superotemporal sclerotomy was then closed with 7-0 Vicryl. The infusion cannula was removed and the preplaced suture secured.  The pressure was additionally adjusted to 21 mmHg by removal of a small amount of gas with a 30-gauge needle 3 mm posterior  to the limbus to 11.  The pressure was rechecked with a Barraquer tonometer and was found to lay at 21.  The conjunctiva was drawn up and reapproximated with a running suture of 6-0 plain gut.  The subconjunctival space irrigated with 0.75% Marcaine, followed by subconjunctival injection of 100 mg ceftazidime and 20 mg of Decadron.  The lid speculum was then removed and a mixed antibiotic ointment applied to the surface of the eye.  An eye patch and shield were then placed over the patients right eye.  Upon awakening from the anesthesia, the patient left the operating room in stable condition. DD:  04/01/01 TD:  04/02/01 Job: [id number]

## 2011-03-03 NOTE — Op Note (Signed)
[place]  Patient:    [name], [name] Visit Number: [id number] MRN: [id number]          Service Type: DSU Location: [id number] Attending Physician:  Ladona Ridgel Dictated by:   Ladona Ridgel, M.D. Proc. Date: 10/23/01 Admit Date:  10/23/2001                             Operative Report  PREOPERATIVE DIAGNOSIS:  Central retinal vein occlusion with epiretinal membrane, optic nerve congestion, and posterior capsule fibrosis, left eye.  POSTOPERATIVE DIAGNOSIS:  Central retinal vein occlusion with epiretinal membrane, optic nerve congestion, and posterior capsule fibrosis, left eye.  PROCEDURES:  Pars plana vitrectomy and membrane peeling and posterior capsulectomy, supplemental panretinal photocoagulation, and radial optic neurotomy, left eye.  SURGEON:  Ladona Ridgel, M.D.  ASSISTANT:  Ulysees Barns.  ANESTHESIA:  General endotracheal.  ESTIMATED BLOOD LOSS:  Less than 1 cc.  COMPLICATIONS:  None.  DESCRIPTION OF PROCEDURE:  The patient was taken to the operating room, where after the induction of general anesthesia the left eye was prepped and draped in the usual fashion.  The lid speculum was introduced and a conjunctival peritomy was developed temporally and superonasally.  Hemostasis was obtained with Eraser cautery, and sclerotomies were fashioned 3.0 mm posterior to the limbus at 1:30, 10:30, and 4:30.  The superior sclerotomies were plugged, and a 4 mm infusion cannula was secured at the 4:30 sclerotomy with a temporary suture of 7-0 Vicryl.  The tip was visually inspected and was found to be in good position.  A Landers ring was secured to the globe with 7-0 Vicryl sutures at 3 and 9.  The plugs were removed and the 30 degree prismatic lens applied to the surface of the eye.  A central core followed by a peripheral vitrectomy was performed.  There was peripheral posterior hyaloid separation. A  magnifying flat lens was then applied to the surface of the eye.  Inspection of the macular area showed that the residual membrane was _____ to the optic nerve, and this was peeled with a DORC serrated forceps.  The macula was inspected and found to be glistening with an epiretinal membrane on the surface.  An MVR blade was used to make incision to the internal limiting membrane/epiretinal membrane, and then this was elevated with the Rice pick and then peeled off the macula at 360 degrees in a maculorhexis.  Attention was then directed to the optic nerve.  The pressure was elevated until the optic nerve was seen to be pulsating and blanched.  The radial optic neurotomy knife was then used to make incision toward the center of the optic nerve in a bifurcation of the central retinal artery and passed to the appropriate depth. The blade was removed, and no immediate hemorrhage ensued.  The pressure was lowered, and approximately at 35 mm a small amount of blood was seen to be coming from the site.  The pressure was raised back to 45 mmHg for approximately two to three minutes.  The bleeding stopped.  The pressure was reduced to 30, and no more bleeding ensued.  The instruments were removed from the eye as the hole was plugged.  The Landers lens and ring were then removed. Inspection with the indirect ophthalmoscope and scleral depression revealed there to be no peripheral retinal breaks or tears, and the headscope laser was then used to add additional  supplemental panretinal photocoagulation just posterior to the ora as well as scattered panretinal photocoagulation throughout the posterior segment.  The superior sclerotomies were then closed with 7-0 Vicryl.  The infusion cannula was removed and the preplaced suture was secured.  The conjunctiva was drawn up and reapproximated with interrupted and running sutures of 6-0 plain gut.  The distal subconjunctival space was irrigated with 0.75%  Marcaine, followed by subconjunctival injection of 100 mg ceftazidime and 4 mm of Decadron.  The pressure was checked prior to the external medications and found to lay at 21 mm.  The lid speculum was then removed and a mixed antibiotic ointment applied to the surface of the eye.  An eye patch and shield were then placed over the patients eye.  Upon awakening from anesthesia, the patient left the operating room in stable condition. Dictated by:   Ladona Ridgel, M.D. Attending Physician:  Ladona Ridgel DD:  10/23/01 TD:  10/23/01 Job: [id number]

## 2011-03-03 NOTE — Discharge Summary (Signed)
NAMEJULIAN, ASKIN NO.:  [id number]   MEDICAL RECORD NO.:  [id number]          PATIENT TYPE:  INP   LOCATION:  5020                         FACILITY:  [facility]   PHYSICIAN:  Sharolyn Douglas, M.D.        DATE OF BIRTH:  16-Dec-1927   DATE OF ADMISSION:  10/18/2005  DATE OF DISCHARGE:  10/24/2005                                 DISCHARGE SUMMARY   ADMISSION DIAGNOSES:  1.  Spondylolisthesis and spinal stenosis, L2 to L5.  2.  Type 2 diabetes.  3.  Hypertension.   DISCHARGE DIAGNOSES:  1.  Status post L3 to L5 laminectomy and fusion, doing well.  2.  Postoperative blood loss anemia.  3.  Postoperative hyperglycemia.   PROCEDURE:  On October 18, 2005, the patient was admitted to the hospital and  taken to the operating room for an L3 to L5 revision laminectomy and  posterior spinal fusion with pedicle screws, L3-4 TLIF, __________ and  Grafton.  This was done by Sharolyn Douglas, M.D., assistant [facility].  Anesthesia used was general.   CONSULTATIONS:  None.   LABORATORY DATA:  Preoperative CBC with differential was within normal  limits.  Postoperatively, H&H were monitored x3 days and reached a low of  9.6 and 27.2 on October 21, 2005.  Coag preoperatively was within normal  limits.  Complete metabolic panel preoperatively was within normal limits.  Postoperatively, basic metabolic panel was monitored x2 days.  She had a  slightly elevated glucose at 117 and 146 on postoperative day #1 and #2,  respectively.  BUN was slightly decreased at 3 on October 20, 2005.  Urinalysis preoperatively was negative.   IMAGING STUDIES:  Intraoperative portal spine x-rays on October 18, 2005 did  show localization at L1 and L4.  X-rays from October 24, 2005 showed good  position and alignment following diskectomy and fusion.  EKG not seen on the  chart.   HISTORY OF PRESENT ILLNESS:  The patient is a 77 year old white female who  has been seen by Dr. Noel Gerold for pain related to  her lumbar spine and  radiation into her lower extremities.  She had previously had a laminectomy  many years ago.  Did well until a fall, and she had repeated lumbar  problems.  Now she is having marked pain, most recent beginning in the  winter of 2005, she had continued back pain and radiation to her lower  extremities since then.  Difficulty standing and walking, markedly  interfering with her day to day activities.  She finds that she really can  not do much as far as getting out and about.  It is starting to interfere  with her mental health as well.  She is depressed and quite miserable  secondary to her poor function.  Dr. Noel Gerold discussed the risks and benefits  of surgery with her.  She indicated understanding and opted to proceed.   HOSPITAL COURSE:  On October 18, 2005, the patient was admitted for the above-  listed procedure and taken to the PACU postoperatively without any apparent  complications.  Routine postoperative orthopedic spine protocol was followed  postoperatively, and the patient progressed along well.   The patient did have some postoperative back pain as expected, and she also  did develop some abdominal bloating overnight from postoperative day #1 to  postoperative day #2.  Her abdomen was nontender, nondistended.  However,  her diet was back down to n.p.o. except sips of water with her medications  until she passed flatus and had normal bowel sounds.  By the following day,  her abdomen was feeling much better.  She did not have any further problems  with that.   Physical therapy and occupational therapy worked with the patient on a daily  basis on progressive ambulation program, brace use, and back precautions.  She progressed along well with them and was safe and independent with all of  the above prior to discharge.   The patient did develop some itching on October 23, 2005.  This was thought  possibly to be from the Vicodin.  She has not taken this  very often per her  report in the past; therefore, we changed her Vicodin to Darvocet which she  has been on for several years.  By October 24, 2005, she was feeling a bit  better.  Her itching had improved, and she was ready for discharge home.   DISCHARGE PLAN:  The patient is a 77 year old female status post revision  laminectomy and posterior spinal fusion doing well.   ACTIVITY:  Daily ambulation program.  Back precautions at all times.  No  lifting heavier than 5 pounds.  Brace on when she is up.  She may shower  when all drainage stops from her incision.  Daily dressing changes until  that time.   FOLLOW UP:  Followup two weeks postoperatively with Dr. Noel Gerold.   DIET:  1800 calorie ADA diet as tolerated.   DISCHARGE MEDICATIONS:  1.  Darvocet for pain.  2.  Robaxin for muscle spasm.  3.  Multivitamin daily.  4.  Calcium daily.  5.  Colace twice daily.  6.  Laxative as needed.  7.  Avoid NSAIDs and resume home medications.   CONDITION ON DISCHARGE:  Stable and improved.   DISPOSITION:  The patient is being discharged to her home with her family's  assistance as well as [place] for home health physical therapy and  occupational therapy.      Verlin Fester, P.A.      Sharolyn Douglas, M.D.  Electronically Signed    CM/MEDQ  D:  01/01/2006  T:  01/02/2006  Job:  [id number]   cc:   Sharolyn Douglas, M.D.  Fax: [phone]

## 2011-03-03 NOTE — Op Note (Signed)
NAMEGIANNI, [name] NO.:  [id number]   MEDICAL RECORD NO.:  [id number]          PATIENT TYPE:  INP   LOCATION:  5020                         FACILITY:  [facility]   PHYSICIAN:  Sharolyn Douglas, M.D.        DATE OF BIRTH:  [birth year]   DATE OF PROCEDURE:  10/18/2005  DATE OF DISCHARGE:                                 OPERATIVE REPORT   DIAGNOSES:  1.  Recurrent lumbar spinal stenosis.  2.  Lumbar spondylolisthesis, L3-4.  3.  History of previous L5-S1 decompression and fusion x2.  4.  History of previous L3-4 and L4-5 laminectomy x2.   PROCEDURE:  1.  Revision of lumbar laminectomy, L3-4, L4-5 and L5-S1.  2.  Transforaminal lumbar interbody fusion, L3-4, with placement of PEEK      cage.  3.  Exploration of L5-S1 fusion.  4.  Segmental pedicle screw instrumentation, L3 through L5, using the Abbott      Spine system.  5.  Posterior spinal arthrodesis, L3 through L5.  6.  Local autogenous bone graft supplemented with bone morphogenic protein      and Grafton allograft.   SURGEON:  Sharolyn Douglas, M.D.   ASSISTANT:  Verlin Fester, P.A.   ANESTHESIA:  General endotracheal.   ESTIMATED BLOOD LOSS:  300 mL.   COMPLICATIONS:  None.   INDICATIONS:  The patient is a pleasant 77 year old female with chronic  progressive back and bilateral lower extremity pain, left greater than  right.  She has had 4 previous lumbar surgeries, in situ fusion and  decompression at L5-S1 and several laminectomies at L3-4 and L4-5.  She has  developed a spondylolisthesis at the L3-4 level and severe recurrent  stenosis.  She has now elected to undergo revision of lumbar decompression  and fusion in hopes of improving her symptoms.  Risk and benefits were  reviewed.   PROCEDURE:  The patient was identified in the holding area and taken to the  operating room, underwent general endotracheal anesthesia difficulty, given  prophylactic IV antibiotics, carefully positioned prone on the [employer]  4-  poster positioning frame, all bony prominences padded, face and eyes  protected at all times, back prepped and draped in the usual sterile  fashion.  Neuro-monitoring was established in the form of SSEPs and lower  extremity EMGs.  The previous midline incision was utilized.  The scar was  excised.  Dissection carried through the deep fascia.  Extensive scar tissue  encountered.  Subperiosteal exposure carried out, exposing the tips the  transverse processes of L3, L4 and L5, as well as the fusion mass at L5-S1.  Great care was taken not to inadvertently injure the spinal canal through  the previous laminectomies.  Intraoperative x-ray taken to confirm levels.  We then placed pedicle screws at L3, L4 and L5 using anatomic probing  technique.  The bone quality was very soft.  We palpated each pedicle hole,  confirming there were no breeches.  We utilized 6.5-mm screws at L3 and L4,  7.5-mm screws in the L5 level bilaterally.  We had good screw purchase,  considering  the patient's bone quality.  Each screw was stimulated using  triggered EMGs and there were no deleterious changes.  We then turned our  attention to performing a revision laminectomy by using curettes, loupes and  headlight magnification.  We were able to dissect the edge of the previous  laminectomy at L3-4, L4-5 and L5-S1.  We then widened the laminectomy out to  the level of the pedicles bilaterally.  We found an extensive amount of scar  tissue as well as spinal stenosis secondary to residual ligamentum flavum  and facet overgrowth.  We had an excellent decompression of the nerve roots  as well as the thecal sac bilaterally.  We then turned our attention to  performing a transforaminal lumbar interbody fusion at L3-4 on the left  side.  The remaining facet joint was osteotomized.  The disk space was  entered.  The exiting and transversing nerve roots were protected.  Free-  running EMGs were monitored.  The disk space  was scraped clean.  We then  dilated the disk space up to 7 mm.  We then inserted bone graft from the  laminectomy along with BMP sponges into the interspace.  This was followed  by a 7-mm PEEK cage packed with BMP sponge; this was carefully inserted into  the interspace, tamped anteriorly and across the midline.  We then completed  the posterior spinal arthrodesis by decorticating the transverse processes  of L3, L4 and L5.  The remaining local bone graft along with Grafton  allograft, 15 mL, packed into the lateral gutters.  We then evaluated the  fusion mass at L5-S1; this appeared solid; there was no motion between the 2  levels and the facet joint appeared arthrodesed.  We then placed short-  segment titanium rods into the polyaxial screw heads.  We applied  compression across the L3-4 segment before shearing off the locking caps.  A  cross-connector was placed.  Hemostasis was achieved.  Gelfoam was left over  the exposed epidural space, deep Hemovac drain left in place.  The deep  fascia was closed with a running #1 Vicryl suture, subcutaneous layer closed  with interrupted 2-0 Vicryl followed by a running 3-0 nylon suture on the  skin edges.  Sterile dressing was applied.  The patient was turned supine,  extubated without difficulty and transferred to Recovery in stable  condition.      Sharolyn Douglas, M.D.  Electronically Signed     MC/MEDQ  D:  10/18/2005  T:  10/19/2005  Job:  [id number]

## 2011-03-03 NOTE — Op Note (Signed)
NAME:  [name], [name]                        ACCOUNT NO.:  [id number]   MEDICAL RECORD NO.:  [id number]                   PATIENT TYPE:  AMB   LOCATION:  DAY                                  FACILITY:  [facility]   PHYSICIAN:  Marlowe Kays, M.D.               DATE OF BIRTH:  Mar 31, 1928   DATE OF PROCEDURE:  06/25/2003  DATE OF DISCHARGE:                                 OPERATIVE REPORT   PREOPERATIVE DIAGNOSIS:  Torn medial meniscus, left knee.   POSTOPERATIVE DIAGNOSIS:  1. Torn medial meniscus.  2. 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   OPERATION:  Left knee arthroscopy with 1) partial medial meniscectomy, 2)  shaving of the medial femoral condyle.   SURGEON:  Marlowe Kays, M.D.   ASSISTANT:  Nurse.   ANESTHESIA:  General.   PATHOLOGY AND JUSTIFICATION FOR PROCEDURE:  She has had some chronic pain to  the inner aspect of the left knee with an MRI demonstrating a torn medial  meniscus. Consequently she is here today for the above mentioned surgery.  See operative description below for additional details.   DESCRIPTION OF PROCEDURE:  Satisfactory general anesthesia, pneumatic  tourniquet, thigh stabilizer, left knee was prepped with Duraprep, draped in  a sterile field. Superomedial saline inflow, first through an anterolateral  portal the medial compartment of the knee joint was evaluated. She had some  moderate synovitis anteriorly with an abrasion injury to the medial meniscus  anteriorly. The synovium was resected and the meniscus shaved. She had  fragments of articular cartilage in the joint which I removed and then  debrided down the medial femoral condyle until smooth. Posteriorly she had  some calcium in the meniscus and fairly extensive diffuse tear from the  posterior curve into the intercondylar area. This was all debrided back to a  stable rim with a variety of baskets and then shaved down until smooth with  a 3.5 shaver. Final  pictures were taken. ACL was intact. I looked up in the  medial gutter and suprapatellar area and she had some wear on the patella  but nothing that required shaving. I then reversed portals, she had a little  fraying of the inner border of the lateral meniscus but did not require  shaving. There was also some calcium consistent with her chondrocalcinosis.  The knee joint was then irrigated until clear and all fluid possible  removed. The two anterior portals were closed with 4-0 nylon, 20 mL of 0.5%  Marcaine with adrenaline and 4 mg of morphine were then instilled through  the in flap rasp which was removed and this portal closed with 4-0 nylon as  well. Betadine adaptic dry sterile dressing were applied. The tourniquet was  released, she tolerated the procedure well and was taken to the recovery  room in satisfactory condition with no known complications.      JA/MEDQ  D:  06/25/2003  T:  06/25/2003  Job:  [id number]

## 2011-03-03 NOTE — Op Note (Signed)
NAME:  [name], [name]                        ACCOUNT NO.:  [id number]   MEDICAL RECORD NO.:  [id number]                   PATIENT TYPE:  AMB   LOCATION:  ENDO                                 FACILITY:  [facility]   PHYSICIAN:  Georgiana Spinner, M.D.                 DATE OF BIRTH:  [birth year]   DATE OF PROCEDURE:  01/26/2004  DATE OF DISCHARGE:                                 OPERATIVE REPORT   PROCEDURE:  Upper endoscopy with biopsy.   INDICATIONS FOR PROCEDURE:  Abdominal pain.   ANESTHESIA:  Demerol 50, Versed 6 mg.   PROCEDURE:  With the patient mildly sedated in the left lateral decubitus  position, the Olympus videoscopic endoscope was inserted in the mouth and  passed under direct vision through the esophagus which appeared normal into  the stomach.  The fundus showed some changes of snake skinning which were  photographed and biopsied.  Before the biopsy, there was some blood flecks  seen in the stomach.  We advanced further, the body, antrum, duodenal bulb,  and second portion of the duodenum appeared normal.  From this point, the  endoscope was slowly withdrawn taking circumferential views of the duodenal  mucosa until the endoscope was pulled back into the stomach and placed in  retroflexion to view the stomach from below.  The endoscope was straightened  and withdrawn taking circumferential views of the remaining gastric and  esophageal mucosa at which time we then stopped to biopsy the fundus of the  stomach as noted.  The patient's vital signs and pulse oximeter remained  stable.  The patient tolerated the procedure well without apparent  complications.   FINDINGS:  Snake skinning of fundus of stomach with some blood flecks seen  in the stomach.  This area was biopsied, await biopsy report.  The patient  will call me for results and follow up with me as an outpatient.                                               Georgiana Spinner, M.D.    GMO/MEDQ  D:  01/26/2004  T:   01/26/2004  Job:  [id number]

## 2011-03-03 NOTE — Discharge Summary (Signed)
[name], [name]                        ACCOUNT NO.:  [id number]   MEDICAL RECORD NO.:  [id number]                   PATIENT TYPE:  INP   LOCATION:  0479                                 FACILITY:  [facility]   PHYSICIAN:  Marlowe Kays, M.D.               DATE OF BIRTH:  Feb 24, 1928   DATE OF ADMISSION:  12/31/2003  DATE OF DISCHARGE:  01/02/2004                                 DISCHARGE SUMMARY   ADMITTING DIAGNOSES:  1. Spinal stenosis L2-L3.  2. Gout.  3. Type 2 diabetes mellitus.  4. Hypertension.  5. Gastroesophageal reflux disease.   DISCHARGE DIAGNOSES:  1. Spinal stenosis L2-L3.  2. Gout.  3. Type 2 diabetes mellitus.  4. Hypertension.  5. Gastroesophageal reflux disease.   OPERATION:  On December 31, 2003, the patient underwent central decompressive  lumbar laminectomy, L2-L3, L3-L4, and L4-L5, assisted by Dr. Ranee Gosselin.   BRIEF HISTORY:  This 77 year old lady had continuing problems concerning her  low back.  She had had two lumbar laminectomies in the past.  Fortunately,  neurovascular was intact to lower extremities.  The patient had moderate low  back pain but primary discomfort in the extremities.  Myelogram CT scan in  February of this year showed nearly a complete spinal blockage at L2-L3, L3-  L4.  It was felt the patient would benefit with surgical intervention and  was admitted for the above procedure.   COURSE IN THE HOSPITAL:  The patient tolerated the surgical procedure quite  well.  The patient required moderate analgesics postoperatively for her  discomfort.  As expected, her wound drained copiously secondary to the  Penrose drain which was placed through a stab wound to drain the operative  site.  We advanced the drain on the first postoperative day.  She was  covered with perioperative antibiotics intravenously and then switched to  p.o. Keflex as long as the wound was draining.  On January 02, 2004, the  Penrose drain was removed, and the wound  was re-dressed.  The patient was  extremely pleased with the reduction in her lower extremity discomfort  postoperatively.  She remained neurovascularly intact into the lower  extremities.  She was ambulating in the hall on the first postoperative day  using a walker for support.  It was thought the patient might require some  home health; however, she stated that her sisters and friends would help her  at home.  We therefore did not arrange for home health.  The day of  discharge, she is seen by Dr. Thomasena Edis, and the wound looked good.  She was  afebrile, neurovascular was intact in the lower extremities.  The drain was  removed and the wounds redressed.   The patient's family physician is Dr. Higinio Plan.  He ordered a BMET, as  the patient was on a diuretic prior to discharge.  The BMET showed that  sodium was 138.  Potassium  had improved from 3.3 to 3.7.  All else was  normal other than an elevated glucose at 136.  The AST was 48 which was a  little high.  As the patient was in good condition, she is to be discharged  home.   LABORATORY VALUES IN THE HOSPITAL:  Hematologically showed a preoperative  CBC completely within normal limits.  PT was 12.5, INR 0.9, PTT 26.  The  admitting CMET showed was normal other than a potassium of 3.3, and a  glucose was 133.  This is repeated postoperatively at Dr. Pincus Sanes  request, as mentioned above.  On January 01, 2004, showing a normal potassium  at 3.7 and a glucose of 136.  Urinalysis was negative for urinary tract  infection.  No electrocardiogram or x-rays are seen on this chart.   CONDITION ON DISCHARGE:  Improved, stable.   PLAN:  The patient to be discharged home in the care of her sisters and  friends.  She is to use dry dressing p.r.n. to her back.  She may shower  when all the drainage stops which presumably will be about 5 days  postoperatively.  She is to do no driving of her vehicle.  She will return  to see Dr. Simonne Come  approximately 2 weeks after the date of surgery.   DISCHARGE MEDICATIONS:  1. Demerol 50 mg tabs #40, 1 q.4-6h. p.r.n. pain.  2. Robaxin 500 mg #30, with a refill, 1 q.6h. p.r.n. muscle spasm.   She is to follow up with Dr. Dimas Alexandria per his instructions and  continue with her home medications and diet per his instructions.     Dooley L. Cherlynn June.                 Marlowe Kays, M.D.    DLU/MEDQ  D:  01/13/2004  T:  01/13/2004  Job:  [id number]   cc:   Janae Bridgeman. Eloise Harman., M.D.  [address]  [place]  [place] [address]  Fax: [phone]   lab values

## 2011-03-03 NOTE — H&P (Signed)
NAME:  [name], [name] NO.:  [id number]   MEDICAL RECORD NO.:  [id number]            PATIENT TYPE:   LOCATION:                                 FACILITY:   PHYSICIAN:  Sharolyn Douglas, M.D.        DATE OF BIRTH:  Feb 08, 1928   DATE OF ADMISSION:  10/18/2005  DATE OF DISCHARGE:                                HISTORY & PHYSICAL   CHIEF COMPLAINT:  Pain in my back and left leg and some on the right with  standing, walking.   HISTORY OF PRESENT ILLNESS:  A 77 year old white female who had been seen by  [place] for continuing pain in the lumbar spine with radiation primarily in the  lower extremities.  This lady has had a previous laminectomy many years ago,  did well until a fall and then she has had repeat lumbar surgeries.  Now she  is having marked pain with the most recent being in late winter of 2005.  She is continuing with back with radiation in the lower extremities, more so  on the left than the right.  She has difficulty in standing and walking.  It  has markedly interfered with her day to day activities that she finds that  she really cannot do much as far as getting out and about and this is  markedly interfering with her psyche.  She is depressed, crying all the  time, and quite miserable.  Spinal stenosis seen at L2-3 with  spondylolisthesis from the prior laminectomy.  L3-4 also shows a  spondylolisthesis and stenosis seen there as well.  4-5 shows a centrum  foraminal stenosis with advanced DJD.  After much discussion including the  risks and benefits of surgery it was decided the patient would benefit with  revision of posterior spinal revision from L2-L5.   PAST MEDICAL HISTORY:  This patient is under the care of Dr. Dimas Alexandria who has provided her with operative clearance.  She has type 2  diabetes, hypertension.   CURRENT MEDICATIONS:  1.  Amaryl 2 mg b.i.d.  2.  Avandia 4 mg one-half tablet b.i.d.  3.  Norvasc 5 mg one daily.  4.  Altace 10 mg one  daily.  5.  Hydrochlorothiazide 25 mg one daily.   ALLERGIES:  CODEINE and MORPHINE DRIP (PCA).   PAST SURGICAL HISTORY:  1.  Hemorrhoidectomy.  2.  Carpal tunnel releases.  3.  Hysterectomy.  4.  Toe surgery.  5.  Cholecystectomy.  6.  Shoulder surgery.  7.  Knee surgery 2004, 2006.  8.  Back surgeries in the past.   FAMILY HISTORY:  Heart disease, diabetes, as well as gallbladder cancer and  bladder cancer in the family.   SOCIAL HISTORY:  The patient is divorced, retired.  No intake of alcohol,  tobacco products.  Has two children.  Sister, Forestine Na, will be primary  caregiver after surgery.  She lives in a two-level home with 12 stairs.   REVIEW OF SYSTEMS:  CNS:  No seizures, shoulder paralysis, numbness, double  vision.  The patient does have  radiculitis into the lower extremities more  so on the left than the right.  CARDIOVASCULAR:  No chest pain.  No angina.  No orthopnea.  RESPIRATORY:  No productive cough.  No hemoptysis.  No  shortness of breath.  GASTROINTESTINAL:  No nausea, vomiting, melena, bloody  stools.  GENITOURINARY:  No discharge, dysuria, hematuria.  MUSCULOSKELETAL:  Primarily in present illness.   PHYSICAL EXAMINATION:  GENERAL:  Alert, oriented, cooperative, friendly,  somewhat anxious and teary-eyed 77 year old white female who looks somewhat  younger than her stated age.  VITAL SIGNS:  Blood pressure 140/70, pulse 80, respirations 16.  HEENT:  Normocephalic.  PERRLA.  EOMs intact.  Oropharynx is clear.  CHEST:  Clear to auscultation.  No rhonchi.  No rales.  HEART:  Regular rate and rhythm.  Grade 2/6 holosystolic murmur heard best  at the left sternal border.  ABDOMEN:  Soft, nontender.  Liver, spleen not felt.  GENITALIA:  Not done.  Not pertinent to present illness.  RECTAL:  Not done.  Not pertinent to present illness.  PELVIC:  Not done.  Not pertinent to present illness.  BREASTS:  Not done.  Not pertinent to present illness.   EXTREMITIES:  The patient has a positive straight leg raise bilaterally more  so on the left than the right and weakness is noted in the left lower  extremity in the tibialis anterior as well as the quads.   ADMITTING DIAGNOSES:  1.  Spondylolisthesis with spinal stenosis.  2.  Diabetes type 2.  3.  Hypertension.   PLAN:  The patient will undergo L2-5 revision with posterior spinal fusion  and laminectomy with screws, transforaminal lumbar interbody fusion with  Allograft and bone morphogenic protein.  This history and physical was  performed in our office today on October 11, 2005 at which time the patient  was fitted with an EBI lumbosacral orthosis which will be used  postoperatively.  If we have any medical problems we will certainly contact  Dr. Dimas Alexandria for his advice.      Dooley L. Cherlynn June.      Sharolyn Douglas, M.D.  Electronically Signed    DLU/MEDQ  D:  10/11/2005  T:  10/11/2005  Job:  [id number]   cc:   Janae Bridgeman. Eloise Harman., M.D.  Fax: [phone]

## 2011-03-03 NOTE — Procedures (Signed)
[facility]  Patient:    [name], [name]                     MRN: [id number] Proc. Date: 11/23/00 Adm. Date:  [id number] Attending:  Sabino Gasser                           Procedure Report  PROCEDURE:  Colonoscopy.  GASTROENTEROLOGIST:  Sabino Gasser, M.D.  INDICATIONS:  Hemoccult-positive stools.  ANESTHESIA:  Demerol 20 mg, Versed 2 mg.  PROCEDURE IN DETAIL:  With the patient mildly sedated and in the left lateral decubitus position, the Olympus video pediatric colonoscope PCF140 was inserted in the rectum and then passed under direct vision to the cecum.  The cecum was identified by ileocecal valve and appendiceal orifice, both of which were photographed.  From this point, the colonoscope was slowly withdrawn, taking circumferential views of the entire colonic mucosa, stopping in the rectum which appeared normal on direct and retroflexed views.  The colonoscope was straightened and withdrawn.  The patients vital signs and pulse oximetry remained stable.  The patient tolerated the procedure well with no apparent complications.  FINDINGS: Essentially normal colonoscopic examination to the cecum.  PLAN:  See endoscopy note. DD:  11/23/00 TD:  11/24/00 Job: [id number]

## 2011-03-03 NOTE — Procedures (Signed)
[facility]  Patient:    [name], [name]                     MRN: [id number] Proc. Date: 11/23/00 Adm. Date:  [id number] Attending:  Sabino Gasser                           Procedure Report  PROCEDURE:  Upper Endoscopy.  GASTROENTEROLOGIST:  Sabino Gasser, M.D.  INDICATIONS:  Hemoccult-positive stools.  ANESTHESIA:  Demerol 80 mg, Versed 8 mg.  PROCEDURE IN DETAIL:  With the patient mildly sedated and in the left lateral decubitus position, the Olympus video endoscope was inserted in the mouth, passed under direct vision through the esophagus, which appeared normal, and into the stomach.   We approached the antrum, and there were changes of what appeared to be intestinal metaplasia with some whitish and villous-appearing mucosa surrounded by a normal-appearing gastric mucosa.  We biopsied both areas.  The endoscope was then advanced into the duodenal bulk and second portion of the duodenum, both of which appeared normal  From this point the endoscope slowly withdrawn, taking circumferential views of entire duodenal mucosa until the endoscope had been pulled back into the stomach, placed in retroflexion to view the stomach from below, and the mucosa appeared somewhat erythematous.  The endoscope was then withdrawn, taking circumferential views of the entire gastric mucosa, stopping in the fundus where what appeared to be a fundic gastritis was photographed and biopsied.  We then withdrew the scope, taking circumferential views of the remaining gastric and esophageal mucosa which otherwise appeared normal.  The patients vital signs and pulse oximetry remained stable.  The patient tolerated the procedure well with no apparent complications.  FINDINGS: 1. Changes of gastritis in the fundus, biopsied. 2. Changes of the mucosa of the antrum also biopsied.  PLAN:  Await biopsy report.  The patient will call me for results and follow up with me as an  outpatient.  Proceed to colonoscopy. DD:  11/23/00 TD:  11/24/00 Job: [id number]

## 2011-03-03 NOTE — Op Note (Signed)
NAMEADALINA, [name] NO.:  [id number]   MEDICAL RECORD NO.:  [id number]          PATIENT TYPE:  AMB   LOCATION:  DAY                          FACILITY:  [facility]   PHYSICIAN:  Marlowe Kays, M.D.  DATE OF BIRTH:  1928-04-02   DATE OF PROCEDURE:  12/13/2004  DATE OF DISCHARGE:                                 OPERATIVE REPORT   PREOPERATIVE DIAGNOSIS:  Severe spinal stenosis at L2-3 above the prior  level of central decompressive laminectomy.   POSTOPERATIVE DIAGNOSIS:  Severe spinal stenosis at L2-3 above the prior  level of central decompressive laminectomy.   OPERATION:  Central decompressive laminectomy L2-3.   SURGEON:  Dr. Simonne Come   ASSISTANT:  Dr. Worthy Rancher   ANESTHESIA:  General.   PATHOLOGY AND JUSTIFICATION FOR PROCEDURE:  Previous decompression was at L2-  3, L3-4 on December 31, 2003.  I removed the inferior portion of the neural  arch of L2 at that time.  She did well initially but after 5-6 months,  developed recurrent back and primarily left leg pain and was noted to have  degenerative spondylolisthesis of L2 and L3 which was a new finding.  This  led to a myelogram which was performed on October 28, 2004, and this  demonstrated complete myelographic block at the L2-3 center of the body of  L2 which appeared to be above the level of the prior decompression.  Accordingly, she is here for the above-mentioned surgery.   DESCRIPTION OF PROCEDURE:  Satisfactory general anesthesia, prophylactic  antibiotics, prone position on the Balinski frame.  Back was prepped with  DuraPrep and with two spinal needles and lateral x-ray, we tentatively  localized the area of the incision, working from [employer].  Then  continued draping the back in a sterile field.  I made an initial midline  incision, identifying what appeared to be the spinous process of L1 which  allowed [place] to work distally.  I took a second x-ray with the hemostat on the  spinous process  of what we thought was L1, and this confirmed that we were  at L1.  I then worked laterally and distally, finding the remnant of the  neural arch of L2 which I outlined with curettes as well as the lateral  masses.  I then used a combination of 2 and 3 mm Kerrison rongeurs to remove  the remainder of the neural arch of L2, working laterally and both distally  and proximally, took the final x-ray confirming our location and at the  completion, the spinal canal appeared to be completely decompressed down to  the previous level of decompression and up almost to L1.  We did not do  anything to the neural arch at L1.  We then irrigated the wound well with  sterile saline.  The decompression was performed mainly with the microscope.  The microscope was removed as were the self-retaining retractors.  There was  no unusual bleeding.  There was no dural tear.  Gelfoam was placed over the  dura and the wound closed in layers with interrupted #1 Vicryl in the  fascia  and deep subcutaneous tissue which I then infiltrated the subcutaneous  tissue with 0.5% plain Marcaine.  She was given 30 mg of Toradol IV.  I then  completed the closure with 2-0 Vicryl  superficially and staples in the skin.  Betadine, Adaptic dry sterile  dressing were applied.  She was gently placed in the OR bed and at the time  of this dictation was on her way to the recovery room in satisfactory  condition with no known complications.  Estimated blood loss was perhaps 100  mL.  No bleeding replacement.      JA/MEDQ  D:  12/13/2004  T:  12/13/2004  Job:  [id number]

## 2011-07-06 ENCOUNTER — Other Ambulatory Visit (HOSPITAL_COMMUNITY): Payer: Self-pay | Admitting: Internal Medicine

## 2011-07-06 DIAGNOSIS — Z1231 Encounter for screening mammogram for malignant neoplasm of breast: Secondary | ICD-10-CM

## 2011-07-17 DIAGNOSIS — D126 Benign neoplasm of colon, unspecified: Secondary | ICD-10-CM

## 2011-07-17 HISTORY — DX: Benign neoplasm of colon, unspecified: D12.6

## 2011-07-18 ENCOUNTER — Encounter: Payer: Self-pay | Admitting: Gastroenterology

## 2011-07-25 ENCOUNTER — Encounter: Payer: Self-pay | Admitting: Gastroenterology

## 2011-07-25 ENCOUNTER — Telehealth: Payer: Self-pay | Admitting: Gastroenterology

## 2011-07-25 ENCOUNTER — Ambulatory Visit (INDEPENDENT_AMBULATORY_CARE_PROVIDER_SITE_OTHER): Payer: Medicare Other | Admitting: Gastroenterology

## 2011-07-25 VITALS — BP 140/78 | HR 96 | Ht 62.0 in | Wt 164.2 lb

## 2011-07-25 DIAGNOSIS — K5900 Constipation, unspecified: Secondary | ICD-10-CM | POA: Insufficient documentation

## 2011-07-25 DIAGNOSIS — K219 Gastro-esophageal reflux disease without esophagitis: Secondary | ICD-10-CM

## 2011-07-25 DIAGNOSIS — Z1211 Encounter for screening for malignant neoplasm of colon: Secondary | ICD-10-CM

## 2011-07-25 DIAGNOSIS — R1319 Other dysphagia: Secondary | ICD-10-CM

## 2011-07-25 MED ORDER — OMEPRAZOLE 20 MG PO CPDR: 20.0000 mg | DELAYED_RELEASE_CAPSULE | Freq: Every day | ORAL | Status: DC

## 2011-07-25 MED ORDER — NA SULFATE-K SULFATE-MG SULF 17.5-3.13-1.6 GM/177ML PO SOLN: 1.0000 | Freq: Every day | ORAL | Status: DC

## 2011-07-25 NOTE — Patient Instructions (Signed)
You have been scheduled for a Upper Endoscopy/ Colonoscopy. See separate instructions.  Pick up your prep kit from your pharmacy.  You have also been scheduled for a Barium Esophagram at [facility] on Thursday October 11th, at 12:30pm . Nothing to eat or drink 8 hours prior to this test. Please arrive 15 minutes early for registration.  Stop ranitidine and start omeprazole 20 mg one tablet by mouth once daily and a prescription has been sent to the pharmacy.  cc: Pearson Grippe, MD

## 2011-07-25 NOTE — Telephone Encounter (Signed)
Patient states she was confused and unaware that her medication for acid reflux was going to change. Informed patient that she told Dr. Russella Dar that ranitidine was not helping and he decided to change it to something that could possibly work better for her acid reflux. Pt states she also gets a 90 day supply for her long term medications. I informed patient to try this medication first before she gets a 90 day supply through [facility]. Patient is going to try a 30 day supply first and call back if she wants a 90 day supply.

## 2011-07-25 NOTE — Progress Notes (Signed)
History of Present Illness: This is an 77 year old female previously followed by Dr. Sabino Gasser. She has a history of recurrent solid food dysphagia and underwent upper endoscopy in 2006  with an empiric dilation and her dysphagia resolved. She states that gradually over the past few years she has had a return of intermittent solid food dysphagia. She has ongoing reflux symptoms which are not completely controlled with ranitidine 150 mg twice daily. She has lifelong constipation and bloating which has not changed. She underwent colonoscopy in August 2002 the normal. Denies weight loss, abdominal pain, diarrhea, change in stool caliber, melena, hematochezia, nausea, vomiting, chest pain.  Past Medical History  Diagnosis Date  . Osteoarthritis   . Degenerative joint disease   . Lumbar spinal stenosis   . Diabetes mellitus, type 2   . Hypertension   . Gout   . GERD (gastroesophageal reflux disease)   . Fundic gland polyps of stomach, benign   . Helicobacter pylori gastritis    Past Surgical History  Procedure Date  . Total knee arthroplasty     left  . Lumbar laminectomy   . Cholecystectomy 1998  . Vaginal hysterectomy 1990's  . Cataract extraction     bilateral  . Retinal laser procedure     bilateral  . Rotator cuff repair     bilateral  . Carpal tunnel release     bilateral  . Hemorrhoid surgery     x 2    reports that she has never smoked. She does not have any smokeless tobacco history on file. She reports that she does not drink alcohol or use illicit drugs. family history includes Cancer in her father and mother; Colon polyps in her sister; Diabetes in her brother, mother, other, paternal aunt, and paternal uncle; Heart disease in her brother and father; and Liver cancer in her father.  There is no history of Colon cancer. No Known Allergies  Outpatient Encounter Prescriptions as of 07/25/2011  Medication Sig Dispense Refill  . amLODipine (NORVASC) 5 MG tablet Take 5 mg by  mouth daily.        [name] aspirin 81 MG tablet Take 81 mg by mouth daily.        . calcium carbonate (OS-CAL) 600 MG TABS Take 600 mg by mouth daily.        . Cholecalciferol (VITAMIN D3) 1000 UNITS CAPS Take 1 tablet by mouth daily.        [name] glimepiride (AMARYL) 2 MG tablet Take 2 mg by mouth 2 (two) times daily.        [name] HYDROcodone-acetaminophen (VICODIN) 5-500 MG per tablet Take 1 tablet by mouth every 6 (six) hours as needed.        . metFORMIN (GLUMETZA) 500 MG (MOD) 24 hr tablet Take 500 mg by mouth 2 (two) times daily.       . ramipril (ALTACE) 10 MG capsule Take 10 mg by mouth daily.        . ranitidine (ZANTAC) 150 MG capsule Take 150 mg by mouth 2 (two) times daily.         Review of Systems: Pertinent positive and negative review of systems were noted in the above HPI section. All other review of systems were otherwise negative.  Physical Exam: General: Well developed , well nourished, no acute distress Head: Normocephalic and atraumatic Eyes:  sclerae anicteric, EOMI Ears: Normal auditory acuity Mouth: No deformity or lesions Neck: Supple, no masses or thyromegaly Lungs: Clear throughout to auscultation Heart:  Regular rate and rhythm; no murmurs, rubs or bruits Abdomen: Soft, non tender and non distended. No masses, hepatosplenomegaly or hernias noted. Normal Bowel sounds Rectal: Deferred to colonoscopy Musculoskeletal: Symmetrical with no gross deformities  Skin: No lesions on visible extremities Pulses:  Normal pulses noted Extremities: No clubbing, cyanosis, edema or deformities noted Neurological: Alert oriented x 4, grossly nonfocal Cervical Nodes:  No significant cervical adenopathy Inguinal Nodes: No significant inguinal adenopathy Psychological:  Alert and cooperative. Normal mood and affect  Assessment and Recommendations:  1. Recurrent solid food dysphagia. Prior endoscopy did not reveal any esophageal pathology. Dysphagia symptoms may be secondary to GERD, a  motility disorder or an esophageal stricture. Schedule barium esophagram and repeat endoscopy with dilation. The risks, benefits, and alternatives to endoscopy with possible biopsy and possible dilation were discussed with the patient and they consent to proceed.   2. GERD. Incomplete symptom control. Standard antireflux measures and changed to omeprazole 20 mg every morning.  3. Chronic constipation. MiraLax 1-3 times a day titrated to effect.  4. Colorectal cancer screening. Average risk. The risks, benefits, and alternatives to colonoscopy with possible biopsy and possible polypectomy were discussed with the patient and they consent to proceed.

## 2011-07-27 ENCOUNTER — Ambulatory Visit (HOSPITAL_COMMUNITY)
Admission: RE | Admit: 2011-07-27 | Discharge: 2011-07-27 | Disposition: A | Discharge status: Home / Self Care | Payer: Medicare Other | Source: Ambulatory Visit | Attending: Internal Medicine | Admitting: Internal Medicine

## 2011-07-27 ENCOUNTER — Ambulatory Visit (HOSPITAL_COMMUNITY)
Admission: RE | Admit: 2011-07-27 | Discharge: 2011-07-27 | Disposition: A | Discharge status: Home / Self Care | Payer: Medicare Other | Source: Ambulatory Visit | Attending: Gastroenterology | Admitting: Gastroenterology

## 2011-07-27 DIAGNOSIS — Z1231 Encounter for screening mammogram for malignant neoplasm of breast: Secondary | ICD-10-CM | POA: Insufficient documentation

## 2011-07-27 DIAGNOSIS — K219 Gastro-esophageal reflux disease without esophagitis: Secondary | ICD-10-CM

## 2011-07-27 DIAGNOSIS — K449 Diaphragmatic hernia without obstruction or gangrene: Secondary | ICD-10-CM | POA: Insufficient documentation

## 2011-07-27 DIAGNOSIS — K224 Dyskinesia of esophagus: Secondary | ICD-10-CM | POA: Insufficient documentation

## 2011-07-27 DIAGNOSIS — R1310 Dysphagia, unspecified: Secondary | ICD-10-CM | POA: Insufficient documentation

## 2011-07-27 DIAGNOSIS — R1319 Other dysphagia: Secondary | ICD-10-CM

## 2011-07-28 LAB — POCT HEMOGLOBIN-HEMACUE: Operator id: 116011

## 2011-07-28 LAB — BASIC METABOLIC PANEL
Chloride: 102
GFR calc non Af Amer: >60
Potassium: 4.6
Sodium: 139

## 2011-08-02 ENCOUNTER — Ambulatory Visit (AMBULATORY_SURGERY_CENTER): Payer: Medicare Other | Admitting: Gastroenterology

## 2011-08-02 ENCOUNTER — Encounter: Payer: Self-pay | Admitting: Gastroenterology

## 2011-08-02 VITALS — BP 146/77 | HR 89 | Temp 97.8°F | Resp 20 | Ht 62.0 in | Wt 165.0 lb

## 2011-08-02 DIAGNOSIS — B029 Zoster without complications: Secondary | ICD-10-CM | POA: Insufficient documentation

## 2011-08-02 DIAGNOSIS — R1319 Other dysphagia: Secondary | ICD-10-CM

## 2011-08-02 DIAGNOSIS — K219 Gastro-esophageal reflux disease without esophagitis: Secondary | ICD-10-CM

## 2011-08-02 DIAGNOSIS — Z1211 Encounter for screening for malignant neoplasm of colon: Secondary | ICD-10-CM

## 2011-08-02 DIAGNOSIS — D126 Benign neoplasm of colon, unspecified: Secondary | ICD-10-CM

## 2011-08-02 LAB — GLUCOSE, CAPILLARY
Glucose-Capillary: 206 mg/dL — ABNORMAL HIGH (ref 70–99)
Glucose-Capillary: 155 mg/dL — ABNORMAL HIGH (ref 70–99)

## 2011-08-02 MED ORDER — SODIUM CHLORIDE 0.9 % IV SOLN: 500.0000 mL | INTRAVENOUS | Status: DC

## 2011-08-02 NOTE — Patient Instructions (Addendum)
Please refer to your neon green sheet for instructions regarding activity for the rest of today.  You may resume your medications as you would normally take them.   Please follow the ESOPHAGEAL DILATION DIET as follows:  -Nothing by mouth until 1145  -Clear Liquids for 1 hour 1145AM-1245PM  -Soft foods for the rest of today 1245PM until tomorrow   Esophageal Stricture (Narrowing) The esophagus is the long, narrow tube which carries food and liquid from the mouth to the stomach. Sometimes a part of the esophagus becomes narrow and makes it difficult, painful, or even impossible to swallow. This is called an esophageal stricture.  SYMPTOMS Some of the problems are difficulty swallowing or pain with swallowing. CAUSES Common causes of blockage or strictures of the esophagus are:  Exposure of the lower esophagus to the acid from the stomach may cause narrowing.   Hiatal hernia in which a small part of the stomach bulges up through the diaphragm can cause a narrowing in the bottom of the esophagus.   Scleroderma is a tissue disorder that affects the esophagus and makes swallowing difficult.   Achalasia is an absence of nerves in the lower esophagus and to the esophageal sphincter. This absence of nerves may be congenital (present since birth). This can cause irregular spasms which do not allow food and fluid through.   Strictures may develop from swallowing materials which damage the esophagus. Examples are acids or alkalis such as lye.   Schatzki's Ring is a narrow ring of non-cancerous tissue which narrows the lower esophagus. The cause of this is unknown.   Growths can block the esophagus.  DIAGNOSIS Your caregiver often suspects this problem by taking a medical history. They will also do a physical exam. They may then take X-rays and/or perform an endoscopy. Endoscopy is an exam in which a tube like a small flexible telescope is used to look at your esophagus.  TREATMENT AND  PROCEDURE  One form of treatment is to dilate the narrow area. This means to stretch it.   When this is not successful, chest surgery may be required. This is a much more extensive form of treatment with a longer recovery time.  Both of the above treatments make the passage of food and water into the stomach easier. They also make it easier for stomach contents to bubble back into the esophagus. Special medications may be used following the procedure to help prevent further narrowing. Medications may be used to lower the amount of acid in the stomach juice.  SEEK IMMEDIATE MEDICAL CARE IF:  Your swallowing is becoming more painful, difficult, or you are unable to swallow.   You vomit up blood.   You develop black tarry stools.   You develop chills or an unexplained fever of over 101 F (38.3 C).   You develop chest or abdominal pain.   You develop shortness of breath, feel lightheaded, or faint.  Follow up with medical care as your caregiver suggests. Document Released: 06/12/2006 Document Re-Released: 07/30/2007 [facility] Patient Information 2011 [place], [place].  Polyps, Colon  A polyp is extra tissue that grows inside your body. Colon polyps grow in the large intestine. The large intestine, also called the colon, is part of your digestive system. It is a long, hollow tube at the end of your digestive tract where your body makes and stores stool. Most polyps are not dangerous. They are benign. This means they are not cancerous. But over time, some types of polyps can turn into  cancer. Polyps that are smaller than a pea are usually not harmful. But larger polyps could someday become or may already be cancerous. To be safe, doctors remove all polyps and test them.  WHO GETS POLYPS? Anyone can get polyps, but certain people are more likely than others. You may have a greater chance of getting polyps if:  You are over 50.   You have had polyps before.   Someone in your family has had  polyps.   Someone in your family has had cancer of the large intestine.   Find out if someone in your family has had polyps. You may also be more likely to get polyps if you:   Eat a lot of fatty foods   Smoke   Drink alcohol   Do not exercise  Eat too much  SYMPTOMS Most small polyps do not cause symptoms. People often do not know they have one until their caregiver finds it during a regular checkup or while testing them for something else. Some people do have symptoms like these:  Bleeding from the anus. You might notice blood on your underwear or on toilet paper after you have had a bowel movement.   Constipation or diarrhea that lasts more than a week.   Blood in the stool. Blood can make stool look black or it can show up as red streaks in the stool.  If you have any of these symptoms, see your caregiver. HOW DOES THE DOCTOR TEST FOR POLYPS? The doctor can use four tests to check for polyps:  Digital rectal exam. The caregiver wears gloves and checks your rectum (the last part of the large intestine) to see if it feels normal. This test would find polyps only in the rectum. Your caregiver may need to do one of the other tests listed below to find polyps higher up in the intestine.   Barium enema. The caregiver puts a liquid called barium into your rectum before taking x-rays of your large intestine. Barium makes your intestine look white in the pictures. Polyps are dark, so they are easy to see.   Sigmoidoscopy. With this test, the caregiver can see inside your large intestine. A thin flexible tube is placed into your rectum. The device is called a sigmoidoscope, which has a light and a tiny video camera in it. The caregiver uses the sigmoidoscope to look at the last third of your large intestine.   Colonoscopy. This test is like sigmoidoscopy, but the caregiver looks at all of the large intestine. It usually requires sedation. This is the most common method for finding and  removing polyps.  TREATMENT  The caregiver will remove the polyp during sigmoidoscopy or colonoscopy. The polyp is then tested for cancer.   If you have had polyps, your caregiver may want you to get tested regularly in the future.  PREVENTION There is not one sure way to prevent polyps. You might be able to lower your risk of getting them if you:  Eat more fruits and vegetables and less fatty food.   Do not smoke.   Avoid alcohol.   Exercise every day.   Lose weight if you are overweight.   Eating more calcium and folate can also lower your risk of getting polyps. Some foods that are rich in calcium are milk, cheese, and broccoli. Some foods that are rich in folate are chickpeas, kidney beans, and spinach.   Aspirin might help prevent polyps. Studies are under way.  Document Released: 06/28/2004 Document  Re-Released: 03/22/2010 [employer] Patient Information 2011 [place], [place].

## 2011-08-03 ENCOUNTER — Telehealth: Payer: Self-pay | Admitting: *Deleted

## 2011-08-03 NOTE — Telephone Encounter (Signed)

## 2011-08-07 ENCOUNTER — Encounter: Payer: Self-pay | Admitting: Gastroenterology

## 2011-08-15 NOTE — H&P (Signed)
  Date of Initial H&P: 08/18/2011  History reviewed, patient examined, no change in status, stable for surgery.

## 2011-08-16 ENCOUNTER — Encounter (HOSPITAL_COMMUNITY): Payer: Self-pay

## 2011-08-18 ENCOUNTER — Encounter (HOSPITAL_COMMUNITY)
Admission: RE | Admit: 2011-08-18 | Discharge: 2011-08-18 | Disposition: A | Discharge status: Home / Self Care | Payer: Medicare Other | Source: Ambulatory Visit | Attending: Orthopedic Surgery | Admitting: Orthopedic Surgery

## 2011-08-18 ENCOUNTER — Encounter (HOSPITAL_COMMUNITY): Payer: Self-pay

## 2011-08-18 ENCOUNTER — Other Ambulatory Visit (HOSPITAL_COMMUNITY): Payer: Self-pay | Admitting: Orthopedic Surgery

## 2011-08-18 DIAGNOSIS — Z981 Arthrodesis status: Secondary | ICD-10-CM

## 2011-08-18 HISTORY — DX: Malignant (primary) neoplasm, unspecified: C80.1

## 2011-08-18 HISTORY — DX: Diaphragmatic hernia without obstruction or gangrene: K44.9

## 2011-08-18 LAB — CBC
MCHC: 34.4 g/dL (ref 30.0–36.0)
Platelets: 264 10*3/uL (ref 150–400)
RDW: 12.8 % (ref 11.5–15.5)
WBC: 10.2 10*3/uL (ref 4.0–10.5)

## 2011-08-18 LAB — TYPE AND SCREEN
ABO/RH(D): B POS
Antibody Screen: NEGATIVE

## 2011-08-18 LAB — BASIC METABOLIC PANEL
BUN: 12 mg/dL (ref 6–23)
Calcium: 10.4 mg/dL (ref 8.4–10.5)
Creatinine, Ser: 0.61 mg/dL (ref 0.50–1.10)
GFR calc Af Amer: >90 mL/min (ref >90)
GFR calc non Af Amer: 83 mL/min — ABNORMAL LOW (ref >90)

## 2011-08-18 LAB — SURGICAL PCR SCREEN
MRSA, PCR: NEGATIVE
Staphylococcus aureus: NEGATIVE

## 2011-08-18 NOTE — Pre-Procedure Instructions (Signed)
20 [name]  08/18/2011   Your procedure is scheduled on:  08/22/2011  Report to [facility] at 5:30 AM.  Call this number if you have problems the morning of surgery: [phone]   Remember:   Do not eat food:After Midnight.  Do not drink clear liquids: 4 Hours before arrival.  Take these medicines the morning of surgery with A SIP OF WATER: omeprazole and amlodipine    Do not wear jewelry, make-up or nail polish.  Do not wear lotions, powders, or perfumes. You may wear deodorant.  Do not shave 48 hours prior to surgery.  Do not bring valuables to the hospital.  Contacts, dentures or bridgework may not be worn into surgery.  Leave suitcase in the car. After surgery it may be brought to your room.  For patients admitted to the hospital, checkout time is 11:00 AM the day of discharge.   Patients discharged the day of surgery will not be allowed to drive home.  Name and phone number of your driver:   Special Instructions: CHG Shower Use Special Wash: 1/2 bottle night before surgery and 1/2 bottle morning of surgery.   Please read over the following fact sheets that you were given: Pain Booklet, Coughing and Deep Breathing, Blood Transfusion Information, MRSA Information and Surgical Site Infection Prevention

## 2011-08-22 ENCOUNTER — Inpatient Hospital Stay (HOSPITAL_COMMUNITY): Payer: Medicare Other

## 2011-08-22 ENCOUNTER — Encounter (HOSPITAL_COMMUNITY): Payer: Self-pay | Admitting: *Deleted

## 2011-08-22 ENCOUNTER — Encounter (HOSPITAL_COMMUNITY): Payer: Self-pay | Admitting: Vascular Surgery

## 2011-08-22 ENCOUNTER — Encounter (HOSPITAL_COMMUNITY)
Admission: RE | Disposition: A | Discharge status: Home Health | Payer: Self-pay | Source: Ambulatory Visit | Attending: Orthopedic Surgery

## 2011-08-22 ENCOUNTER — Inpatient Hospital Stay (HOSPITAL_COMMUNITY): Payer: Medicare Other | Admitting: Vascular Surgery

## 2011-08-22 ENCOUNTER — Inpatient Hospital Stay (HOSPITAL_COMMUNITY)
Admission: RE | Admit: 2011-08-22 | Discharge: 2011-08-29 | DRG: 456 | Disposition: A | Discharge status: Home Health | Payer: Medicare Other | Source: Ambulatory Visit | Attending: Orthopedic Surgery | Admitting: Orthopedic Surgery

## 2011-08-22 DIAGNOSIS — Y838 Other surgical procedures as the cause of abnormal reaction of the patient, or of later complication, without mention of misadventure at the time of the procedure: Secondary | ICD-10-CM | POA: Diagnosis not present

## 2011-08-22 DIAGNOSIS — M5137 Other intervertebral disc degeneration, lumbosacral region: Secondary | ICD-10-CM | POA: Diagnosis present

## 2011-08-22 DIAGNOSIS — B029 Zoster without complications: Secondary | ICD-10-CM | POA: Diagnosis present

## 2011-08-22 DIAGNOSIS — M48061 Spinal stenosis, lumbar region without neurogenic claudication: Secondary | ICD-10-CM

## 2011-08-22 DIAGNOSIS — K5900 Constipation, unspecified: Secondary | ICD-10-CM | POA: Diagnosis not present

## 2011-08-22 DIAGNOSIS — M47817 Spondylosis without myelopathy or radiculopathy, lumbosacral region: Secondary | ICD-10-CM | POA: Diagnosis present

## 2011-08-22 DIAGNOSIS — K219 Gastro-esophageal reflux disease without esophagitis: Secondary | ICD-10-CM | POA: Diagnosis present

## 2011-08-22 DIAGNOSIS — Z01812 Encounter for preprocedural laboratory examination: Secondary | ICD-10-CM

## 2011-08-22 DIAGNOSIS — J9819 Other pulmonary collapse: Secondary | ICD-10-CM | POA: Diagnosis not present

## 2011-08-22 DIAGNOSIS — J988 Other specified respiratory disorders: Secondary | ICD-10-CM | POA: Diagnosis not present

## 2011-08-22 DIAGNOSIS — E119 Type 2 diabetes mellitus without complications: Secondary | ICD-10-CM | POA: Diagnosis present

## 2011-08-22 DIAGNOSIS — Z96659 Presence of unspecified artificial knee joint: Secondary | ICD-10-CM

## 2011-08-22 DIAGNOSIS — M4120 Other idiopathic scoliosis, site unspecified: Principal | ICD-10-CM | POA: Diagnosis present

## 2011-08-22 DIAGNOSIS — M51379 Other intervertebral disc degeneration, lumbosacral region without mention of lumbar back pain or lower extremity pain: Secondary | ICD-10-CM | POA: Diagnosis present

## 2011-08-22 DIAGNOSIS — R0902 Hypoxemia: Secondary | ICD-10-CM | POA: Diagnosis not present

## 2011-08-22 DIAGNOSIS — M4804 Spinal stenosis, thoracic region: Secondary | ICD-10-CM | POA: Diagnosis present

## 2011-08-22 DIAGNOSIS — I10 Essential (primary) hypertension: Secondary | ICD-10-CM | POA: Diagnosis present

## 2011-08-22 DIAGNOSIS — I2699 Other pulmonary embolism without acute cor pulmonale: Secondary | ICD-10-CM | POA: Diagnosis not present

## 2011-08-22 DIAGNOSIS — Z7901 Long term (current) use of anticoagulants: Secondary | ICD-10-CM

## 2011-08-22 HISTORY — PX: SPINAL FUSION: SHX223

## 2011-08-22 LAB — GLUCOSE, CAPILLARY
Glucose-Capillary: 173 mg/dL — ABNORMAL HIGH (ref 70–99)
Glucose-Capillary: 163 mg/dL — ABNORMAL HIGH (ref 70–99)

## 2011-08-22 LAB — VITAMIN D 25 HYDROXY (VIT D DEFICIENCY, FRACTURES): Vit D, 25-Hydroxy: 42 ng/mL (ref 30–89)

## 2011-08-22 LAB — DIFFERENTIAL
Basophils Absolute: 0.1 10*3/uL (ref 0.0–0.1)
Basophils Relative: 1 % (ref 0–1)
Lymphocytes Relative: 16 % (ref 12–46)
Neutro Abs: 8 10*3/uL — ABNORMAL HIGH (ref 1.7–7.7)

## 2011-08-22 LAB — PROTIME-INR: Prothrombin Time: 13.7 seconds (ref 11.6–15.2)

## 2011-08-22 LAB — APTT: aPTT: 30 seconds (ref 24–37)

## 2011-08-22 SURGERY — FUSION, SPINE, 2 OR MORE LEVELS, POSTERIOR APPROACH
Anesthesia: General | Site: Back | Wound class: Clean

## 2011-08-22 MED ORDER — METHOCARBAMOL 100 MG/ML IJ SOLN
500.0000 mg | Freq: Four times a day (QID) | INTRAVENOUS | Status: DC | PRN
  Filled 2011-08-22: qty 5

## 2011-08-22 MED ORDER — HYDROMORPHONE HCL PF 1 MG/ML IJ SOLN
0.2500 mg | INTRAMUSCULAR | Status: DC | PRN
  Administered 2011-08-22: 0.25 mg via INTRAVENOUS
  Administered 2011-08-22: 0.5 mg via INTRAVENOUS
  Administered 2011-08-22: 0.25 mg via INTRAVENOUS

## 2011-08-22 MED ORDER — NEOSTIGMINE METHYLSULFATE 1 MG/ML IJ SOLN
INTRAMUSCULAR | Status: DC | PRN
  Administered 2011-08-22: 3 mg via INTRAVENOUS

## 2011-08-22 MED ORDER — HYDROMORPHONE 0.3 MG/ML IV SOLN
INTRAVENOUS | Status: DC
  Administered 2011-08-22: 7.5 mg via INTRAVENOUS
  Administered 2011-08-23: 2.1 mg via INTRAVENOUS
  Administered 2011-08-23: 1.8 mg via INTRAVENOUS
  Administered 2011-08-23: 7.5 mg via INTRAVENOUS
  Administered 2011-08-23: 0.3 mg via INTRAVENOUS
  Filled 2011-08-22 (×2): qty 25

## 2011-08-22 MED ORDER — LIDOCAINE-EPINEPHRINE (PF) 1 %-1:200000 IJ SOLN
INTRAMUSCULAR | Status: DC | PRN
  Administered 2011-08-22: 12.5 mL

## 2011-08-22 MED ORDER — SODIUM CHLORIDE 0.9 % IJ SOLN: 9.0000 mL | INTRAMUSCULAR | Status: DC | PRN

## 2011-08-22 MED ORDER — VANCOMYCIN HCL IN DEXTROSE 1-5 GM/200ML-% IV SOLN
1000.0000 mg | Freq: Two times a day (BID) | INTRAVENOUS | Status: AC
  Administered 2011-08-22: 1000 mg via INTRAVENOUS
  Filled 2011-08-22: qty 200

## 2011-08-22 MED ORDER — PHENYLEPHRINE HCL 10 MG/ML IJ SOLN
INTRAMUSCULAR | Status: DC | PRN
  Administered 2011-08-22: 40 ug via INTRAVENOUS

## 2011-08-22 MED ORDER — VANCOMYCIN HCL IN DEXTROSE 1-5 GM/200ML-% IV SOLN
1000.0000 mg | INTRAVENOUS | Status: DC
  Filled 2011-08-22: qty 200

## 2011-08-22 MED ORDER — BUPIVACAINE LIPOSOME 1.3 % IJ SUSP
INTRAMUSCULAR | Status: DC | PRN
  Administered 2011-08-22: 20 mL

## 2011-08-22 MED ORDER — BUPIVACAINE LIPOSOME 1.3 % IJ SUSP
20.0000 mL | Freq: Once | INTRAMUSCULAR | Status: DC
  Filled 2011-08-22: qty 20

## 2011-08-22 MED ORDER — METOCLOPRAMIDE HCL 5 MG/ML IJ SOLN
5.0000 mg | Freq: Three times a day (TID) | INTRAMUSCULAR | Status: DC | PRN
  Filled 2011-08-22: qty 2

## 2011-08-22 MED ORDER — SENNOSIDES-DOCUSATE SODIUM 8.6-50 MG PO TABS: 1.0000 | ORAL_TABLET | Freq: Every evening | ORAL | Status: DC | PRN

## 2011-08-22 MED ORDER — METHOCARBAMOL 500 MG PO TABS
500.0000 mg | ORAL_TABLET | Freq: Four times a day (QID) | ORAL | Status: DC | PRN
  Administered 2011-08-23 – 2011-08-29 (×8): 500 mg via ORAL
  Filled 2011-08-22 (×9): qty 1

## 2011-08-22 MED ORDER — ONDANSETRON HCL 4 MG PO TABS
4.0000 mg | ORAL_TABLET | Freq: Four times a day (QID) | ORAL | Status: DC | PRN
  Administered 2011-08-27: 4 mg via ORAL
  Filled 2011-08-22: qty 1

## 2011-08-22 MED ORDER — DIPHENHYDRAMINE HCL 50 MG/ML IJ SOLN: 12.5000 mg | Freq: Four times a day (QID) | INTRAMUSCULAR | Status: DC | PRN

## 2011-08-22 MED ORDER — FENTANYL CITRATE 0.05 MG/ML IJ SOLN
INTRAMUSCULAR | Status: DC | PRN
  Administered 2011-08-22 (×4): 50 ug via INTRAVENOUS
  Administered 2011-08-22: 100 ug via INTRAVENOUS
  Administered 2011-08-22: 50 ug via INTRAVENOUS

## 2011-08-22 MED ORDER — DIPHENHYDRAMINE HCL 12.5 MG/5ML PO ELIX
12.5000 mg | ORAL_SOLUTION | Freq: Four times a day (QID) | ORAL | Status: DC | PRN
  Filled 2011-08-22: qty 5

## 2011-08-22 MED ORDER — HEMOSTATIC AGENTS (NO CHARGE) OPTIME
TOPICAL | Status: DC | PRN
  Administered 2011-08-22: 2 via TOPICAL

## 2011-08-22 MED ORDER — METOCLOPRAMIDE HCL 10 MG PO TABS
5.0000 mg | ORAL_TABLET | Freq: Three times a day (TID) | ORAL | Status: DC | PRN
  Administered 2011-08-27: 10 mg via ORAL
  Filled 2011-08-22: qty 2

## 2011-08-22 MED ORDER — VANCOMYCIN HCL 1000 MG IV SOLR
1000.0000 mg | INTRAVENOUS | Status: DC | PRN
  Administered 2011-08-22: 1000 g via INTRAVENOUS

## 2011-08-22 MED ORDER — ROCURONIUM BROMIDE 100 MG/10ML IV SOLN
INTRAVENOUS | Status: DC | PRN
  Administered 2011-08-22: 50 mg via INTRAVENOUS

## 2011-08-22 MED ORDER — SODIUM CHLORIDE 0.9 % IR SOLN
Status: DC | PRN
  Administered 2011-08-22 (×2)

## 2011-08-22 MED ORDER — VANCOMYCIN HCL 1000 MG IV SOLR
1000.0000 mg | INTRAVENOUS | Status: AC
  Administered 2011-08-22: 1000 mg
  Filled 2011-08-22: qty 1000

## 2011-08-22 MED ORDER — HYDROCODONE-ACETAMINOPHEN 10-325 MG PO TABS
1.0000 | ORAL_TABLET | ORAL | Status: DC | PRN
  Administered 2011-08-24 (×2): 1 via ORAL
  Administered 2011-08-25 – 2011-08-29 (×13): 2 via ORAL
  Filled 2011-08-22: qty 1
  Filled 2011-08-22 (×2): qty 2
  Filled 2011-08-22: qty 1
  Filled 2011-08-22 (×5): qty 2
  Filled 2011-08-22: qty 1
  Filled 2011-08-22 (×2): qty 2
  Filled 2011-08-22: qty 1
  Filled 2011-08-22 (×3): qty 2

## 2011-08-22 MED ORDER — BUPIVACAINE HCL (PF) 0.25 % IJ SOLN
INTRAMUSCULAR | Status: DC | PRN
  Administered 2011-08-22: 12.5 mL

## 2011-08-22 MED ORDER — PROPOFOL 10 MG/ML IV EMUL
INTRAVENOUS | Status: DC | PRN
  Administered 2011-08-22: 150 mg via INTRAVENOUS

## 2011-08-22 MED ORDER — GLYCOPYRROLATE 0.2 MG/ML IJ SOLN
INTRAMUSCULAR | Status: DC | PRN
  Administered 2011-08-22: .4 mg via INTRAVENOUS

## 2011-08-22 MED ORDER — NALOXONE HCL 0.4 MG/ML IJ SOLN: 0.4000 mg | INTRAMUSCULAR | Status: DC | PRN

## 2011-08-22 MED ORDER — THROMBIN 20000 UNITS EX KIT
PACK | CUTANEOUS | Status: DC | PRN
  Administered 2011-08-22 (×2): via TOPICAL

## 2011-08-22 MED ORDER — ONDANSETRON HCL 4 MG/2ML IJ SOLN
INTRAMUSCULAR | Status: DC | PRN
  Administered 2011-08-22: 4 mg via INTRAVENOUS

## 2011-08-22 MED ORDER — SODIUM CHLORIDE 0.9 % IV SOLN
INTRAVENOUS | Status: DC
  Administered 2011-08-22: 20:00:00 via INTRAVENOUS
  Administered 2011-08-25: 1000 mL via INTRAVENOUS

## 2011-08-22 MED ORDER — LACTATED RINGERS IV SOLN
INTRAVENOUS | Status: DC | PRN
  Administered 2011-08-22 (×2): via INTRAVENOUS

## 2011-08-22 MED ORDER — LACTATED RINGERS IV SOLN: INTRAVENOUS | Status: DC

## 2011-08-22 MED ORDER — ONDANSETRON HCL 4 MG/2ML IJ SOLN: 4.0000 mg | Freq: Four times a day (QID) | INTRAMUSCULAR | Status: DC | PRN

## 2011-08-22 MED ORDER — ONDANSETRON HCL 4 MG/2ML IJ SOLN
4.0000 mg | Freq: Four times a day (QID) | INTRAMUSCULAR | Status: DC | PRN
  Administered 2011-08-24 – 2011-08-25 (×3): 4 mg via INTRAVENOUS
  Filled 2011-08-22 (×3): qty 2

## 2011-08-22 MED ORDER — PROPOFOL 10 MG/ML IV EMUL
INTRAVENOUS | Status: DC | PRN
  Administered 2011-08-22: 75 ug/kg/min via INTRAVENOUS

## 2011-08-22 MED ORDER — THROMBIN 20000 UNITS EX KIT: PACK | CUTANEOUS | Status: DC | PRN

## 2011-08-22 SURGICAL SUPPLY — 85 items
APL SKNCLS STERI-STRIP NONHPOA (GAUZE/BANDAGES/DRESSINGS) ×2
BENZOIN TINCTURE PRP APPL 2/3 (GAUZE/BANDAGES/DRESSINGS) ×4 IMPLANT
BLADE SURG ROTATE 9660 (MISCELLANEOUS) IMPLANT
BUR MATCHSTICK NEURO 3.0 LAGG (BURR) ×1 IMPLANT
CARTRIDGE OIL MAESTRO DRILL (MISCELLANEOUS) ×1 IMPLANT
CLOTH BEACON ORANGE TIMEOUT ST (SAFETY) ×2 IMPLANT
CONNECTOR ROD 5.5 OPEN/CLOSED (Connector) ×2 IMPLANT
CORDS BIPOLAR (ELECTRODE) ×2 IMPLANT
COVER SURGICAL LIGHT HANDLE (MISCELLANEOUS) ×2 IMPLANT
DIFFUSER DRILL AIR PNEUMATIC (MISCELLANEOUS) ×2 IMPLANT
DRAIN CHANNEL 15F RND FF W/TCR (WOUND CARE) ×1 IMPLANT
DRAIN TLS ROUND 10FR (DRAIN) IMPLANT
DRAPE PROXIMA HALF (DRAPES) ×8 IMPLANT
DRAPE SURG 17X23 STRL (DRAPES) ×9 IMPLANT
DRAPE TABLE COVER HEAVY DUTY (DRAPES) ×2 IMPLANT
DRSG MEPILEX BORDER 4X12 (GAUZE/BANDAGES/DRESSINGS) ×1 IMPLANT
DRSG MEPILEX BORDER 4X4 (GAUZE/BANDAGES/DRESSINGS) ×1 IMPLANT
DRSG MEPILEX BORDER 4X8 (GAUZE/BANDAGES/DRESSINGS) ×1 IMPLANT
DRSG OPSITE 11X17.75 LRG (GAUZE/BANDAGES/DRESSINGS) ×2 IMPLANT
DRSG PAD ABDOMINAL 8X10 ST (GAUZE/BANDAGES/DRESSINGS) ×2 IMPLANT
DURAPREP 26ML APPLICATOR (WOUND CARE) ×2 IMPLANT
ELECT BLADE 4.0 EZ CLEAN MEGAD (MISCELLANEOUS) ×2
ELECT CAUTERY BLADE 6.4 (BLADE) ×2 IMPLANT
ELECT REM PT RETURN 9FT ADLT (ELECTROSURGICAL) ×2
ELECTRODE BLDE 4.0 EZ CLN MEGD (MISCELLANEOUS) ×1 IMPLANT
ELECTRODE REM PT RTRN 9FT ADLT (ELECTROSURGICAL) ×1 IMPLANT
EVACUATOR 1/8 PVC DRAIN (DRAIN) ×10 IMPLANT
EVACUATOR SILICONE 100CC (DRAIN) ×2 IMPLANT
GAUZE SPONGE 4X4 16PLY XRAY LF (GAUZE/BANDAGES/DRESSINGS) ×25 IMPLANT
GLOVE BIOGEL PI IND STRL 7.5 (GLOVE) ×1 IMPLANT
GLOVE BIOGEL PI IND STRL 9 (GLOVE) ×1 IMPLANT
GLOVE BIOGEL PI INDICATOR 7.5 (GLOVE) ×1
GLOVE BIOGEL PI INDICATOR 9 (GLOVE) ×1
GLOVE SS BIOGEL STRL SZ 7 (GLOVE) ×1 IMPLANT
GLOVE SS BIOGEL STRL SZ 8.5 (GLOVE) ×1 IMPLANT
GLOVE SUPERSENSE BIOGEL SZ 7 (GLOVE) ×1
GLOVE SUPERSENSE BIOGEL SZ 8.5 (GLOVE) ×3
GOWN PREVENTION PLUS XLARGE (GOWN DISPOSABLE) ×2 IMPLANT
GOWN STRL NON-REIN LRG LVL3 (GOWN DISPOSABLE) ×8 IMPLANT
HOOK OFFSET THORACIC LEFT (Hook) ×1 IMPLANT
HOOK OFFSET THORACIC RIGHT (Hook) ×1 IMPLANT
KIT BASIN OR (CUSTOM PROCEDURE TRAY) ×2 IMPLANT
KIT POSITION SURG JACKSON T1 (MISCELLANEOUS) ×2 IMPLANT
KIT ROOM TURNOVER OR (KITS) ×2 IMPLANT
MANIFOLD NEPTUNE II (INSTRUMENTS) ×2 IMPLANT
NDL SPNL 22GX3.5 QUINCKE BK (NEEDLE) ×1 IMPLANT
NEEDLE 22X1 1/2 (OR ONLY) (NEEDLE) ×2 IMPLANT
NEEDLE SPNL 22GX3.5 QUINCKE BK (NEEDLE) ×2 IMPLANT
NS IRRIG 1000ML POUR BTL (IV SOLUTION) ×2 IMPLANT
OIL CARTRIDGE MAESTRO DRILL (MISCELLANEOUS) ×2
PACK LAMINECTOMY ORTHO (CUSTOM PROCEDURE TRAY) ×2 IMPLANT
PACK UNIVERSAL I (CUSTOM PROCEDURE TRAY) ×2 IMPLANT
PAD ARMBOARD 7.5X6 YLW CONV (MISCELLANEOUS) ×2 IMPLANT
PATTIES SURGICAL .5 X1 (DISPOSABLE) ×4 IMPLANT
PATTIES SURGICAL .75X.75 (GAUZE/BANDAGES/DRESSINGS) IMPLANT
PATTIES SURGICAL 1X1 (DISPOSABLE) ×4 IMPLANT
PENCIL BUTTON HOLSTER BLD 10FT (ELECTRODE) ×2 IMPLANT
PUTTY 10CC (Orthopedic Implant) ×1 IMPLANT
ROD TEMPLATE (Rod) ×1 IMPLANT
SCREW SET ATR (Screw) ×4 IMPLANT
SPONGE GAUZE 4X4 12PLY (GAUZE/BANDAGES/DRESSINGS) ×2 IMPLANT
SPONGE NEURO XRAY DETECT 1X3 (DISPOSABLE) ×1 IMPLANT
SPONGE SURGIFOAM ABS GEL 100 (HEMOSTASIS) ×1 IMPLANT
STAPLER VISISTAT (STAPLE) ×2 IMPLANT
STRIP CLOSURE SKIN 1/2X4 (GAUZE/BANDAGES/DRESSINGS) ×4 IMPLANT
SURGIFLO TRUKIT (HEMOSTASIS) ×4 IMPLANT
SUT ETHILON 2 0 FS 18 (SUTURE) ×2 IMPLANT
SUT VIC AB 0 CTX 18 (SUTURE) ×1 IMPLANT
SUT VIC AB 1 CT1 18XCR BRD 8 (SUTURE) ×1 IMPLANT
SUT VIC AB 1 CT1 8-18 (SUTURE)
SUT VIC AB 1 CTX 18 (SUTURE) IMPLANT
SUT VIC AB 1 CTX 36 (SUTURE) ×2
SUT VIC AB 1 CTX36XBRD ANBCTR (SUTURE) ×3 IMPLANT
SUT VIC AB 2-0 CT1 18 (SUTURE) ×2 IMPLANT
SUT VIC AB 2-0 CT1 27 (SUTURE)
SUT VIC AB 2-0 CT1 TAPERPNT 27 (SUTURE) ×3 IMPLANT
SUT VIC AB 3-0 X1 27 (SUTURE) ×3 IMPLANT
SYR BULB IRRIGATION 50ML (SYRINGE) ×2 IMPLANT
SYR CONTROL 10ML LL (SYRINGE) ×2 IMPLANT
SYSTEM CHEST DRAIN TLS 7FR (DRAIN) IMPLANT
TOWEL OR 17X24 6PK STRL BLUE (TOWEL DISPOSABLE) ×2 IMPLANT
TOWEL OR 17X26 10 PK STRL BLUE (TOWEL DISPOSABLE) ×2 IMPLANT
TRAY FOLEY CATH 14FR (SET/KITS/TRAYS/PACK) ×3 IMPLANT
TUBE SUCT ARGYLE STRL (TUBING) ×2 IMPLANT
WATER STERILE IRR 1000ML POUR (IV SOLUTION) ×8 IMPLANT

## 2011-08-22 NOTE — Transfer of Care (Signed)
Immediate Anesthesia Transfer of Care Note  Patient: [name]  Procedure(s) Performed:  FUSION POSTERIOR SPINAL MULTILEVEL/SCOLIOSIS - LUMBAR LAMINECTOMY, EXTENSION OF POSTERIOR LUMBAR FUSION TO T10 WITH K2M RODS, SCREWS, CONNECTORS, HOOKS, ILIAC CREST BONE GRAFT  Patient Location: PACU  Anesthesia Type: General  Level of Consciousness: awake  Airway & Oxygen Therapy: Patient Spontanous Breathing and Patient connected to face mask oxygen  Post-op Assessment: Report given to PACU RN  Post vital signs: stable  Complications: No apparent anesthesia complications

## 2011-08-22 NOTE — Progress Notes (Signed)
Patient arrived to room 5013 at 0450 via bed.  Daughter-in-law in room.  Patient is alert and oriented x 4.  She is currently on 2L O2 per [place].  There is LR running at 100 ml per hr.  Foley catheter in place with clear yellow urine in drainage bag.  Mepilex to back and a JP drain and an hemovac are in place with most of the drainage emptying into the JP.  Patient thirsty.  Will continue to monitor.

## 2011-08-22 NOTE — Anesthesia Procedure Notes (Addendum)
Procedure Name: Intubation Date/Time: 08/22/2011 7:54 AM Performed by: De Nurse Pre-anesthesia Checklist: Patient identified, Emergency Drugs available, Suction available and Patient being monitored Patient Re-evaluated:Patient Re-evaluated prior to inductionOxygen Delivery Method: Circle System Utilized Preoxygenation: Pre-oxygenation with 100% oxygen Intubation Type: IV induction Ventilation: Mask ventilation without difficulty Laryngoscope Size: Mac and 4 Grade View: Grade I Tube type: Oral Tube size: 7.5 mm Number of attempts: 1 Airway Equipment and Method: stylet Placement Confirmation: ETT inserted through vocal cords under direct vision,  positive ETCO2 and breath sounds checked- equal and bilateral Secured at: 21 cm Tube secured with: Tape Dental Injury: Teeth and Oropharynx as per pre-operative assessment

## 2011-08-22 NOTE — Anesthesia Preprocedure Evaluation (Addendum)
Anesthesia Evaluation  Patient identified by MRN, date of birth, ID band Patient awake    Reviewed: Allergy & Precautions, H&P , NPO status , Patient's Chart, lab work & pertinent test results  Airway Mallampati: II TM Distance: >3 FB Neck ROM: Full    Dental  (+) Dental Advisory Given and Teeth Intact   Pulmonary          Cardiovascular hypertension,     Neuro/Psych  Neuromuscular disease    GI/Hepatic hiatal hernia, GERD-  ,  Endo/Other  Diabetes mellitus-  Renal/GU      Musculoskeletal   Abdominal   Peds  Hematology   Anesthesia Other Findings   Reproductive/Obstetrics                         Anesthesia Physical Anesthesia Plan  ASA: III  Anesthesia Plan: General   Post-op Pain Management:    Induction: Intravenous  Airway Management Planned: Oral ETT  Additional Equipment:   Intra-op Plan:   Post-operative Plan: Possible Post-op intubation/ventilation  Informed Consent: I have reviewed the patients History and Physical, chart, labs and discussed the procedure including the risks, benefits and alternatives for the proposed anesthesia with the patient or authorized representative who has indicated his/her understanding and acceptance.   Dental advisory given  Plan Discussed with:   Anesthesia Plan Comments:         Anesthesia Quick Evaluation

## 2011-08-22 NOTE — Transfer of Care (Signed)
Immediate Anesthesia Transfer of Care Note  Patient: [name]  Procedure(s) Performed:  FUSION POSTERIOR SPINAL MULTILEVEL/SCOLIOSIS - LUMBAR LAMINECTOMY, EXTENSION OF POSTERIOR LUMBAR FUSION TO T10 WITH K2M RODS, SCREWS, CONNECTORS, HOOKS, ILIAC CREST BONE GRAFT  Patient Location: PACU  Anesthesia Type: General  Level of Consciousness: awake  Airway & Oxygen Therapy: Patient Spontanous Breathing  Post-op Assessment: Post -op Vital signs reviewed and stable  Post vital signs: stable  Complications: No apparent anesthesia complications

## 2011-08-22 NOTE — Brief Op Note (Signed)
08/22/2011  1:01 PM  PATIENT:  [name]  77 y.o. female  PRE-OPERATIVE DIAGNOSIS:  SPINAL STENOSIS of lumbar region status post ARTHRODESIS  POST-OPERATIVE DIAGNOSIS:  Spinal Stenosis of lumbar region status post arthodesis  PROCEDURE:  Procedure(s): FUSION POSTERIOR SPINAL MULTILEVEL/SCOLIOSIS  SURGEON:  Surgeon(s): S Jowel Waltner  PHYSICIAN ASSISTANT:   ASSISTANTS: Lianne Cure PA-C   ANESTHESIA:   general  EBL:  Total I/O In: 1000 [I.V.:1000] Out: 600 [Urine:350; Blood:250]  BLOOD ADMINISTERED:none  DRAINS: () Blake drain(s) in the  Hemovac drain  LOCAL MEDICATIONS USED:  OTHER Exprel 20 cc's  SPECIMEN:  No Specimen  DISPOSITION OF SPECIMEN:  N/A  COUNTS:  YES  TOURNIQUET:  * No tourniquets in log *  DICTATION: .Other Dictation: Dictation Number [id number]  PLAN OF CARE: Admit to inpatient   PATIENT DISPOSITION:  5000   Delay start of Pharmacological VTE agent (>24hrs) due to surgical blood loss or risk of bleeding:  not applicable

## 2011-08-22 NOTE — Preoperative (Signed)
Beta Blockers   Reason not to administer Beta Blockers:Not Applicable 

## 2011-08-22 NOTE — Op Note (Signed)
NAME:  [name], [name] NO.:  [id number]  MEDICAL RECORD NO.:  [id number]  LOCATION:  [facility]                         FACILITY:  [facility]  PHYSICIAN:  Nelda Severe, MD      DATE OF BIRTH:  1927-12-14  DATE OF PROCEDURE:  08/22/2011 DATE OF DISCHARGE:                              OPERATIVE REPORT   SURGEON:  Nelda Severe, MD  ASSISTANT:  Lianne Cure, PA-C  PREOPERATIVE DIAGNOSES:  Thoracolumbar kyphosis, thoracolumbar spondylosis, spinal stenosis at the junction between the previous fusion and mobile spine, spondylosis at junction of previous fusion and mobile spine.  POSTOPERATIVE DIAGNOSIS:  Correctable kyphosis in prone position, mild- to-moderate T12-L1 spinal stenosis, thoracolumbar spondylosis, status post lumbar fusion.  OPERATIVE PROCEDURE:  Extension of lumbar fusion to T10 using pedicle screws, hooks, side connectors to previous instrumentation, right autogenous iliac crest graft and NovaBone (Bioglass), harvest right posterior iliac crest autograft.  OPERATIVE NOTE:  The patient was placed under general endotracheal anesthesia.  Electrodes were attached to upper and lower extremities and scalp for neuro monitoring.  Foley catheter placed in the bladder. Vancomycin 1 g was delivered intravenously.  Sequential compression devices were placed on both lower extremities.  The patient was then positioned prone on the [place] frame with care taken to position the upper extremities, so as to avoid hyperflexion, abduction of shoulders, so as to avoid hyperflexion of the elbows.  Upper extremities were padded with foam from axilla to hands.  Thighs, knees, shins, and ankles were supported on pillows.  The previous midline incision was marked with a skin marker and extended proximally into the lower thoracic spine.  The thoracolumbar area was prepped with DuraPrep and draped in rectangular fashion.  The drapes were secured with Ioban.  Time-out was held at  this point at which time the usual information was confirmed/discussed.  The incision was made from approximately T9 distally to L3, ellipsing of the previous scar after injection of subcutaneous tissue with a mixture of 0.25% plain Marcaine and 1% lidocaine with epinephrine.  Dissection was carried down to the spinous processes of the distal thoracic spine.  We identified the screws and rods bilaterally and exposed the upper 2 screw junctions.  We exposed the thoracic spine to the upper portion of T10. We then made pedicle holes in the pedicles of T12 and T11 bilaterally as well as placed down facing laminar hooks over the upper portion of the lamina of T10.  This required some preparation of the interlaminar space between T9 and T10 using a high-speed bur to remove a small amount of bone proximally, then curetting away the ligamentum flavum and using a 2- mm Kerrison to create a laminotomy sufficient to insert a down facing hook on either side.  We also cleaned out the soft tissue and some bone graft which had previously incorporated deep to the rod between the L1 and L2 screws, so that we could attach a K2M side connector on either side.  At this point, I had already studied carefully the preoperative CT scan and MRI scan.  I found that there was not severe stenosis as it had been my original impression at the junctional level and I  certainly did not want to do a large laminectomy at this level as it could be avoided because of the fact that I needed to be able to place the graft from the lamina to the lamina in the thoracic spine.  Ultimately, I did do a laminotomy centrally and did not detect bulging dura and did not proceed further.  Once the pedicle holes were made at T11 and T12 bilaterally, the side connectors were placed bilaterally into the rods which were already in situ and hooks placed over the top of the T10 lamina.  Two 100-mm rods were contoured and provisionally  attached.  Cross-table lateral radiograph showed good position of the implants.  We then torqued all the couplings and in the case of the pedicle screws at the T11 and T12 locked down with a locking device for the [facility] system.  I then did the above-mentioned laminotomy which was quite small at the distal T12.  There did not appear to be severe stenosis there and I did not persist with the wide laminectomy.  Prior to final torquing of the couplings, etc, I did harvest the right posterior iliac crest graft through an oblique incision.  The gluteal muscle was detached from the iliac crest.  Osteotome was used to remove a piece of the outer table of the ilium and the and the cancellous bone from inside the ilium removed with a gouge.  A moderate amount of graft was obtained, but not as much as I had hoped.  Therefore, it was mixed with NovaBone (Bioglass).  We then prepared the fusion bed using osteotome and high-speed bur to remove the facet joint surfaces at T12- L1, T11-T12, and T9-T10.  Graft was then packed in bilaterally.  Ultimately, AP and lateral radiographs were taken which showed satisfactory position of all fixation.  We placed Gelfoam over the graft so it would not migrate into the wound. A 15-gauge Blake drain was placed subfascially, brought out through the skin to the right side where it was secured with a 2-0 nylon suture.  We then closed the thoracolumbar fascia using interrupted #1 Vicryl sutures.  A subcutaneous drain was placed from the iliac crest wound through the central wound and out through the right side and secured with a 2-0 nylon.  We did close the deep wound with #1 Vicryl sutures, suturing the gluteus maximus fascia to the iliac crest.  The 8-inch Hemovac drain was in the subcutaneous layer which was then closed with interrupted inverted 0-Vicryl suture.  The thoracolumbar wound was closed with 2-0 Vicryl suture in the subcutaneous layer.  Skin of  both wounds was closed with 3-0 continuous subcuticular Vicryl suture.  The skin edges were reinforced with Dermabond.  Mepilex dressings were applied.  The blood loss estimated was 250 mL.  Not enough blood was scavenged to transfuse with Cell Saver blood.  Sponge and needle counts were correct. There were no intraoperative complications.  At the time of dictation, the patient has not yet been examined in the recovery room.     Nelda Severe, MD     MT/MEDQ  D:  08/22/2011  T:  08/22/2011  Job:  [id number]

## 2011-08-22 NOTE — Anesthesia Postprocedure Evaluation (Signed)
  Anesthesia Post-op Note  Patient: [name]  Procedure(s) Performed:  FUSION POSTERIOR SPINAL MULTILEVEL/SCOLIOSIS - LUMBAR LAMINECTOMY, EXTENSION OF POSTERIOR LUMBAR FUSION TO T10 WITH K2M RODS, SCREWS, CONNECTORS, HOOKS, ILIAC CREST BONE GRAFT  Patient Location: PACU  Anesthesia Type: General  Level of Consciousness: alert   Airway and Oxygen Therapy: Patient Spontanous Breathing  Post-op Pain: mild  Post-op Assessment: Post-op Vital signs reviewed  Post-op Vital Signs: stable  Complications: No apparent anesthesia complications

## 2011-08-23 ENCOUNTER — Inpatient Hospital Stay (HOSPITAL_COMMUNITY): Payer: Medicare Other

## 2011-08-23 ENCOUNTER — Other Ambulatory Visit: Payer: Self-pay

## 2011-08-23 ENCOUNTER — Other Ambulatory Visit: Payer: Self-pay | Admitting: Orthopedic Surgery

## 2011-08-23 DIAGNOSIS — E119 Type 2 diabetes mellitus without complications: Secondary | ICD-10-CM | POA: Diagnosis present

## 2011-08-23 DIAGNOSIS — M48061 Spinal stenosis, lumbar region without neurogenic claudication: Secondary | ICD-10-CM | POA: Diagnosis present

## 2011-08-23 DIAGNOSIS — R0902 Hypoxemia: Secondary | ICD-10-CM | POA: Diagnosis not present

## 2011-08-23 LAB — GLUCOSE, CAPILLARY
Glucose-Capillary: 238 mg/dL — ABNORMAL HIGH (ref 70–99)
Glucose-Capillary: 171 mg/dL — ABNORMAL HIGH (ref 70–99)
Glucose-Capillary: 212 mg/dL — ABNORMAL HIGH (ref 70–99)

## 2011-08-23 LAB — BASIC METABOLIC PANEL
Calcium: 8.7 mg/dL (ref 8.4–10.5)
GFR calc Af Amer: >90 mL/min (ref >90)
GFR calc non Af Amer: 82 mL/min — ABNORMAL LOW (ref >90)
Sodium: 135 mEq/L (ref 135–145)

## 2011-08-23 MED ORDER — HYPROMELLOSE (GONIOSCOPIC) 2.5 % OP SOLN
1.0000 [drp] | Freq: Every day | OPHTHALMIC | Status: DC | PRN
  Filled 2011-08-23: qty 15

## 2011-08-23 MED ORDER — POLYETHYL GLYCOL-PROPYL GLYCOL 0.4-0.3 % OP SOLN: OPHTHALMIC | Status: DC

## 2011-08-23 MED ORDER — HYDROMORPHONE HCL PF 1 MG/ML IJ SOLN
1.0000 mg | INTRAMUSCULAR | Status: DC | PRN
  Administered 2011-08-26 (×2): 1 mg via INTRAVENOUS
  Filled 2011-08-23 (×2): qty 1

## 2011-08-23 MED ORDER — HYDROMORPHONE 0.3 MG/ML IV SOLN
INTRAVENOUS | Status: DC
  Administered 2011-08-23: 7.5 mg via INTRAVENOUS
  Administered 2011-08-24 (×2): 0.4 mg via INTRAVENOUS
  Administered 2011-08-24: 2.1 mg via INTRAVENOUS
  Administered 2011-08-24: 0.6 mg via INTRAVENOUS
  Administered 2011-08-24: 7.5 mg via INTRAVENOUS
  Filled 2011-08-23: qty 25

## 2011-08-23 MED ORDER — RAMIPRIL 10 MG PO CAPS
10.0000 mg | ORAL_CAPSULE | Freq: Every day | ORAL | Status: DC
Start: 2011-08-23 — End: 2011-08-29
  Administered 2011-08-23 – 2011-08-29 (×7): 10 mg via ORAL
  Filled 2011-08-23 (×7): qty 1

## 2011-08-23 MED ORDER — PANTOPRAZOLE SODIUM 40 MG PO TBEC
40.0000 mg | DELAYED_RELEASE_TABLET | Freq: Every day | ORAL | Status: DC
  Administered 2011-08-23 – 2011-08-29 (×7): 40 mg via ORAL
  Filled 2011-08-23 (×8): qty 1

## 2011-08-23 MED ORDER — POLYVINYL ALCOHOL 1.4 % OP SOLN
1.0000 [drp] | Freq: Every day | OPHTHALMIC | Status: DC | PRN
  Filled 2011-08-23: qty 15

## 2011-08-23 MED ORDER — ENOXAPARIN SODIUM 40 MG/0.4ML ~~LOC~~ SOLN
40.0000 mg | SUBCUTANEOUS | Status: DC
  Administered 2011-08-23: 40 mg via SUBCUTANEOUS
  Filled 2011-08-23 (×2): qty 0.4

## 2011-08-23 MED ORDER — METFORMIN HCL 500 MG PO TABS
500.0000 mg | ORAL_TABLET | Freq: Two times a day (BID) | ORAL | Status: DC
  Administered 2011-08-23 – 2011-08-29 (×11): 500 mg via ORAL
  Filled 2011-08-23 (×14): qty 1

## 2011-08-23 MED ORDER — INSULIN ASPART 100 UNIT/ML ~~LOC~~ SOLN
0.0000 [IU] | Freq: Three times a day (TID) | SUBCUTANEOUS | Status: DC
  Administered 2011-08-23: 5 [IU] via SUBCUTANEOUS
  Administered 2011-08-23: 3 [IU] via SUBCUTANEOUS
  Administered 2011-08-24: 5 [IU] via SUBCUTANEOUS
  Administered 2011-08-24 (×2): 3 [IU] via SUBCUTANEOUS
  Administered 2011-08-25: 5 [IU] via SUBCUTANEOUS
  Administered 2011-08-25 – 2011-08-26 (×2): 3 [IU] via SUBCUTANEOUS
  Administered 2011-08-26: 8 [IU] via SUBCUTANEOUS
  Administered 2011-08-26: 1 [IU] via SUBCUTANEOUS
  Administered 2011-08-27: 5 [IU] via SUBCUTANEOUS
  Administered 2011-08-27: 2 [IU] via SUBCUTANEOUS
  Administered 2011-08-28: 3 [IU] via SUBCUTANEOUS
  Filled 2011-08-23: qty 3

## 2011-08-23 MED ORDER — GLIMEPIRIDE 2 MG PO TABS
2.0000 mg | ORAL_TABLET | Freq: Two times a day (BID) | ORAL | Status: DC
  Administered 2011-08-23 – 2011-08-29 (×12): 2 mg via ORAL
  Filled 2011-08-23 (×17): qty 1

## 2011-08-23 MED ORDER — AMLODIPINE BESYLATE 5 MG PO TABS
5.0000 mg | ORAL_TABLET | Freq: Every day | ORAL | Status: DC
Start: 2011-08-23 — End: 2011-08-29
  Administered 2011-08-23 – 2011-08-29 (×7): 5 mg via ORAL
  Filled 2011-08-23 (×7): qty 1

## 2011-08-23 NOTE — Consult Note (Signed)
Requesting physician:Dr.Tooke  Reason for consultation: Hypoxia  History of Present Illness: [name] is a very pleasant 77 year old white female was admitted yesterday for elective lumbar spinal fusion. Had unremarkable intraoperative course. Was brought to the floor subsequently started on a PCA pump for pain control. Today on 5000 as the nurses attempted to wean her supplemental oxygen off, they were unable to do it and subsequently her sats were noted to be 73% off oxygen on room air. She was put back on oxygen at 2 L currently satting in the 95-96% range. [facility] were consulted for further evaluation and management. At this time denies any chest pain, shortness of breath, cough, congestion, wheezing, fevers or chills.    Allergies:   Allergies  Allergen Reactions  . Celebrex (Celecoxib) Swelling  . Morphine Sulfate Nausea Only  . Percocet (Oxycodone-Acetaminophen) Itching      Past Medical History  Diagnosis Date  . Osteoarthritis   . Degenerative joint disease   . Lumbar spinal stenosis   . Diabetes mellitus, type 2   . Hypertension   . Gout   . GERD (gastroesophageal reflux disease)   . Fundic gland polyps of stomach, benign   . Helicobacter pylori gastritis   . Shingles     current shingles- x4 yrs., on L side of face     . Hiatal hernia     colonoscopy & endoscopy- 2 wks. ago, diag /w small hiatal hernia   . Cancer     melanoma- R arm , area excised     Past Surgical History  Procedure Date  . Total knee arthroplasty     left  . Lumbar laminectomy     6 prior back surgery   . Cholecystectomy 1998  . Vaginal hysterectomy 1990's  . Cataract extraction     bilateral, /w IOL  . Retinal laser procedure     bilateral  . Rotator cuff repair     bilateral  . Carpal tunnel release     bilateral  . Hemorrhoid surgery     x 2  . Tonsillectomy   . Benign br. biopsy- many yrs. ago     Scheduled Meds:   . amLODipine  5 mg Oral Daily  .  glimepiride  2 mg Oral BID WC  . HYDROmorphone PCA 0.3 mg/mL   Intravenous Q4H  . insulin aspart  0-15 Units Subcutaneous TID WC  . metFORMIN  500 mg Oral BID  . pantoprazole  40 mg Oral Q1200  . ramipril  10 mg Oral Daily  . vancomycin  1,000 mg Intravenous Q12H  . DISCONTD: Polyethyl Glycol-Propyl Glycol   Both Eyes 1 day or 1 dose   Continuous Infusions:   . sodium chloride 100 mL/hr at 08/22/11 1940   PRN Meds:.diphenhydrAMINE, diphenhydrAMINE, HYDROcodone-acetaminophen, methocarbamol(ROBAXIN) IV, methocarbamol, metoCLOPramide (REGLAN) injection, metoCLOPramide, naloxone, ondansetron (ZOFRAN) IV, ondansetron (ZOFRAN) IV, ondansetron, polyvinyl alcohol, senna-docusate, sodium chloride, DISCONTD: hydroxypropyl methylcellulose  Social History:  reports that she has never smoked. She does not have any smokeless tobacco history on file. She reports that she does not drink alcohol or use illicit drugs.  Family History  Problem Relation Age of Onset  . Colon cancer Neg Hx   . Anesthesia problems Neg Hx   . Hypotension Neg Hx   . Malignant hyperthermia Neg Hx   . Pseudochol deficiency Neg Hx   . Diabetes Mother   . Cancer Mother     mother  . Diabetes Paternal Aunt  x 1  . Diabetes Paternal Uncle     x 3  . Diabetes Brother   . Diabetes Other     neice  . Heart disease Father   . Cancer Father     gallbladder  . Liver cancer Father   . Heart disease Brother   . Colon polyps Sister     Review of Systems:  Constitutional: Denies fever, chills, diaphoresis, appetite change and fatigue.  HEENT: Denies photophobia, eye pain, redness, hearing loss, ear pain, congestion, sore throat, rhinorrhea, sneezing, mouth sores, trouble swallowing, neck pain, neck stiffness and tinnitus.   Respiratory: Denies SOB, DOE, cough, chest tightness,  and wheezing.   Cardiovascular: Denies chest pain, palpitations and leg swelling.  Gastrointestinal: Denies nausea, vomiting, abdominal pain,  diarrhea, constipation, blood in stool and abdominal distention.  Genitourinary: Denies dysuria, urgency, frequency, hematuria, flank pain and difficulty urinating.  Musculoskeletal: Reports pain in the lower back and bilateral upper thigh region Skin: Denies pallor, rash and wound.  Neurological: Denies dizziness, seizures, syncope, weakness, light-headedness, numbness and headaches.  Hematological: Denies adenopathy. Easy bruising, personal or family bleeding history  Psychiatric/Behavioral: Denies suicidal ideation, mood changes, confusion, nervousness, sleep disturbance and agitation   Physical Exam: Blood pressure 124/56, pulse 86, temperature 100.7 F (38.2 C), temperature source Oral, resp. rate 18, SpO2 94.00% on 2L Gen: she is alert awake oriented x3 in no respiratory distress CVS: S1-S2 regular rate rhythm no murmurs rubs or gallops no S3 appreciated Lungs: mildly decreased breath sounds at the bases, poor inspiratory effort. Abdomen: Soft nontender mildly distended no organomegaly. Extremities: Edema clubbing or cyanosis, Homans sign is negative Neuro moves all extremities no localizing signs    Labs on Admission:  Results for orders placed during the hospital encounter of 08/22/11 (from the past 48 hour(s))  GLUCOSE, CAPILLARY     Status: Abnormal   Collection Time   08/22/11  6:45 AM      Component Value Range Comment   Glucose-Capillary 173 (*) 70 - 99 (mg/dL)   DIFFERENTIAL     Status: Abnormal   Collection Time   08/22/11  7:30 AM      Component Value Range Comment   Neutrophils Relative 73  43 - 77 (%)    Neutro Abs 8.0 (*) 1.7 - 7.7 (K/uL)    Lymphocytes Relative 16  12 - 46 (%)    Lymphs Abs 1.8  0.7 - 4.0 (K/uL)    Monocytes Relative 7  3 - 12 (%)    Monocytes Absolute 0.8  0.1 - 1.0 (K/uL)    Eosinophils Relative 4  0 - 5 (%)    Eosinophils Absolute 0.4  0.0 - 0.7 (K/uL)    Basophils Relative 1  0 - 1 (%)    Basophils Absolute 0.1  0.0 - 0.1 (K/uL)     PROTIME-INR     Status: Normal   Collection Time   08/22/11  7:30 AM      Component Value Range Comment   Prothrombin Time 13.7  11.6 - 15.2 (seconds)    INR 1.03  0.00 - 1.49    APTT     Status: Normal   Collection Time   08/22/11  7:30 AM      Component Value Range Comment   aPTT 30  24 - 37 (seconds)   VITAMIN D 25 HYDROXY     Status: Normal   Collection Time   08/22/11  7:30 AM      Component Value  Range Comment   Vit D, 25-Hydroxy 42  30 - 89 (ng/mL)   GLUCOSE, CAPILLARY     Status: Abnormal   Collection Time   08/22/11  1:12 PM      Component Value Range Comment   Glucose-Capillary 133 (*) 70 - 99 (mg/dL)   GLUCOSE, CAPILLARY     Status: Abnormal   Collection Time   08/22/11  8:55 PM      Component Value Range Comment   Glucose-Capillary 163 (*) 70 - 99 (mg/dL)    Comment 1 Documented in Chart      Comment 2 Notify RN     BASIC METABOLIC PANEL     Status: Abnormal   Collection Time   08/23/11  7:00 AM      Component Value Range Comment   Sodium 135  135 - 145 (mEq/L)    Potassium 4.4  3.5 - 5.1 (mEq/L)    Chloride 100  96 - 112 (mEq/L)    CO2 24  19 - 32 (mEq/L)    Glucose, Bld 183 (*) 70 - 99 (mg/dL)    BUN 8  6 - 23 (mg/dL)    Creatinine, Ser 4.09  0.50 - 1.10 (mg/dL)    Calcium 8.7  8.4 - 10.5 (mg/dL)    GFR calc non Af Amer 82 (*) >90 (mL/min)    GFR calc Af Amer >90  >90 (mL/min)   GLUCOSE, CAPILLARY     Status: Abnormal   Collection Time   08/23/11  7:04 AM      Component Value Range Comment   Glucose-Capillary 194 (*) 70 - 99 (mg/dL)    Comment 1 Documented in Chart      Comment 2 Notify RN     GLUCOSE, CAPILLARY     Status: Abnormal   Collection Time   08/23/11  8:52 AM      Component Value Range Comment   Glucose-Capillary 204 (*) 70 - 99 (mg/dL)    Comment 1 Notify RN      Comment 2 Documented in Chart     GLUCOSE, CAPILLARY     Status: Abnormal   Collection Time   08/23/11 11:49 AM      Component Value Range Comment   Glucose-Capillary 238 (*) 70  - 99 (mg/dL)    Comment 1 Notify RN      Comment 2 Documented in Chart     GLUCOSE, CAPILLARY     Status: Abnormal   Collection Time   08/23/11  4:40 PM      Component Value Range Comment   Glucose-Capillary 171 (*) 70 - 99 (mg/dL)     Radiological Exams on Admission: Dg Chest 2 View  08/23/2011  *RADIOLOGY REPORT*  Clinical Data: Chest pain.  Shortness of breath.  Hypoxia.  CHEST - 2 VIEW  Comparison: 08/18/2011.  Findings: Very poor inspiration.  No change in chronic elevation of the right hemidiaphragm.  The cardiac silhouette remains near the upper limit of normal in size.  The lungs are clear.  Diffuse osteopenia.  Thoracolumbar spine fixation hardware. Cholecystectomy clips.  IMPRESSION: Very poor inspiration.  No acute abnormality.  Original Report Authenticated By: Darrol Angel, M.D.   Dg Lumbar Spine Complete  08/22/2011  *RADIOLOGY REPORT*  Clinical Data: Postop lumbar fusion.  LUMBAR SPINE - COMPLETE 4+ VIEW  Comparison: CT 12/13/2010.  Findings: Four intraoperative views are submitted postoperatively from the operating room.  Image #1 at 0850 hours demonstrates a posterior localizing needle overlying  the L1 spinous process.  The L1-L4 pedicle screws and interconnecting rods appear unchanged.  Image #2 at 1029 hours demonstrates the placement of bilateral pedicle screws at T11 and T12.  Image #3 at 1124 hours demonstrates the placement of interconnecting rods extending superiorly.  There are bilateral laminar hooks at T10.  Hardware appears well positioned.  The final view is a prone view at 1125 hours and demonstrates the hardware to be well positioned.  IMPRESSION: Intraoperative views during revision of lumbar fusion, now extending from T10-L4.  No demonstrated complication.  Original Report Authenticated By: Gerrianne Scale, M.D.    Assessment/Plan Active Problems: 1. Hypoxia: I suspect this is secondary to atelectasis and poor inspiratory effort due to being postop as well as  IV narcotics. Her EKG is unremarkable and x-ray is only remarkable for poor inspiratory effort, at this time she is on 2 L of oxygen with her sats in the mid-90s, will use incentive spirometer every one hour while awake, wean off PCA as possible, out of bed to chair and attempt to wean oxygen off. If for any reason her oxygen saturation decreases further or or becomes tachycardic will need to get a CT angiogram of the chest to rule out PE. At this point in time based on her clinical status my suspicion for for a PE is very low.  2.Spinal stenosis of lumbar region at multiple levels  3.Diabetes mellitus: Continue oral hypoglycemics and sliding scale insulin 4. DVT prophylaxis start Lovenox  For the consult we will follow the patient with you.   Time Spent on Admission:  Ladiamond Gallina 08/23/2011, 7:03 PM

## 2011-08-23 NOTE — Progress Notes (Signed)
Subjective: Painful in the abdomin and anterior thighs.     Objective: Vital signs in last 24 hours: Temp:  [99.1 F (37.3 C)-100.3 F (37.9 C)] 99.1 F (37.3 C) (11/07 0530) Pulse Rate:  [59-98] 87  (11/07 0530) Resp:  [17-22] 18  (11/07 0530) BP: (131-139)/(47-54) 139/54 mmHg (11/07 0530) SpO2:  [2 %-100 %] 95 % (11/07 0530)  Intake/Output from previous day: 11/06 0701 - 11/07 0700 In: 1700 [I.V.:1700] Out: 1905 [Urine:1000; Drains:580; Blood:250] Intake/Output this shift:    No results found for this basename: HGB:5 in the last 72 hours No results found for this basename: WBC:2,RBC:2,HCT:2,PLT:2 in the last 72 hours No results found for this basename: NA:2,K:2,CL:2,CO2:2,BUN:2,CREATININE:2,GLUCOSE:2,CALCIUM:2 in the last 72 hours  Basename 08/22/11 0730  LABPT --  INR 1.03    Neurologically intact ABD soft Neurovascular intact Sensation intact distally Incision: no drainage  Assessment/Plan: S/P T10-L2 fusion with iliac bone graft harvest.  Pending TLSO brace.  May get up without brace until drains D/C'd.  Cont. Current treatment.   [name] [name] 08/23/2011, 8:07 AM

## 2011-08-24 ENCOUNTER — Inpatient Hospital Stay (HOSPITAL_COMMUNITY): Payer: Medicare Other

## 2011-08-24 LAB — PROTIME-INR
INR: 1.22 (ref 0.00–1.49)
Prothrombin Time: 15.7 seconds — ABNORMAL HIGH (ref 11.6–15.2)

## 2011-08-24 LAB — BASIC METABOLIC PANEL
Calcium: 8.7 mg/dL (ref 8.4–10.5)
Chloride: 101 mEq/L (ref 96–112)
Creatinine, Ser: 0.67 mg/dL (ref 0.50–1.10)
GFR calc Af Amer: >90 mL/min (ref >90)
GFR calc non Af Amer: 81 mL/min — ABNORMAL LOW (ref >90)

## 2011-08-24 LAB — GLUCOSE, CAPILLARY
Glucose-Capillary: 207 mg/dL — ABNORMAL HIGH (ref 70–99)
Glucose-Capillary: 156 mg/dL — ABNORMAL HIGH (ref 70–99)

## 2011-08-24 LAB — CBC
MCHC: 33.6 g/dL (ref 30.0–36.0)
Platelets: 144 10*3/uL — ABNORMAL LOW (ref 150–400)
RDW: 12.7 % (ref 11.5–15.5)
WBC: 10.6 10*3/uL — ABNORMAL HIGH (ref 4.0–10.5)

## 2011-08-24 LAB — VITAMIN D 25 HYDROXY (VIT D DEFICIENCY, FRACTURES): Vit D, 25-Hydroxy: 40 ng/mL (ref 30–89)

## 2011-08-24 LAB — APTT: aPTT: 33 seconds (ref 24–37)

## 2011-08-24 MED ORDER — MAGIC MOUTHWASH
5.0000 mL | Freq: Four times a day (QID) | ORAL | Status: DC | PRN
  Administered 2011-08-24: 5 mL via ORAL
  Filled 2011-08-24 (×2): qty 5

## 2011-08-24 MED ORDER — HEPARIN (PORCINE) IN NACL 100-0.45 UNIT/ML-% IJ SOLN
1250.0000 [IU]/h | INTRAMUSCULAR | Status: DC
  Administered 2011-08-24: 1000 [IU]/h via INTRAVENOUS
  Filled 2011-08-24 (×5): qty 250

## 2011-08-24 MED ORDER — IOHEXOL 300 MG/ML  SOLN
100.0000 mL | Freq: Once | INTRAMUSCULAR | Status: AC | PRN
  Administered 2011-08-24: 100 mL via INTRAVENOUS

## 2011-08-24 NOTE — Progress Notes (Addendum)
Physical Therapy Evaluation Patient Details Name: [name] MRN: [id number] DOB: 1928/01/21 Today's Date: 08/24/2011  Problem List:  Patient Active Problem List  Diagnoses  . Esophageal reflux  . Unspecified constipation  . Shingles  . Hypoxia  . Spinal stenosis of lumbar region at multiple levels  . Diabetes mellitus    Past Medical History:  Past Medical History  Diagnosis Date  . Osteoarthritis   . Degenerative joint disease   . Lumbar spinal stenosis   . Diabetes mellitus, type 2   . Hypertension   . Gout   . GERD (gastroesophageal reflux disease)   . Fundic gland polyps of stomach, benign   . Helicobacter pylori gastritis   . Shingles     current shingles- x4 yrs., on L side of face     . Hiatal hernia     colonoscopy & endoscopy- 2 wks. ago, diag /w small hiatal hernia   . Cancer     melanoma- R arm , area excised    Past Surgical History:  Past Surgical History  Procedure Date  . Total knee arthroplasty     left  . Lumbar laminectomy     6 prior back surgery   . Cholecystectomy 1998  . Vaginal hysterectomy 1990's  . Cataract extraction     bilateral, /w IOL  . Retinal laser procedure     bilateral  . Rotator cuff repair     bilateral  . Carpal tunnel release     bilateral  . Hemorrhoid surgery     x 2  . Tonsillectomy   . Benign br. biopsy- many yrs. ago     PT Assessment/Plan/Recommendation PT Assessment Clinical Impression Statement: Likely will be able to get home safely with limited family assist and home health PT PT Recommendation/Assessment: Patient will need skilled PT in the acute care venue PT Problem List: Decreased strength;Decreased activity tolerance;Decreased mobility;Decreased knowledge of use of DME;Decreased knowledge of precautions Barriers to Discharge: Decreased caregiver support PT Therapy Diagnosis : Acute pain;Generalized weakness PT Plan PT Frequency: Min 5X/week PT Recommendation Follow Up Recommendations:  Home health PT Equipment Recommended: None recommended by PT PT Goals  Acute Rehab PT Goals PT Goal Formulation: With patient Time For Goal Achievement: 7 days Pt will go Supine/Side to Sit: Independently PT Goal: Supine/Side to Sit - Progress: Other (comment) Pt will Transfer Sit to Stand/Stand to Sit: Independently PT Transfer Goal: Sit to Stand/Stand to Sit - Progress: Other (comment) Pt will Transfer Bed to Chair/Chair to Bed: Independently PT Transfer Goal: Bed to Chair/Chair to Bed - Progress: Other (comment) Pt will Ambulate: >150 feet;with modified independence;with rolling walker PT Goal: Ambulate - Progress: Other (comment) Pt will Go Up / Down Stairs: 1-2 stairs;with rail(s) PT Goal: Up/Down Stairs - Progress: Other (comment)  PT Evaluation Precautions/Restrictions  Precautions Precautions: Back Precaution Comments: instructed in precautions, pt needs reinforcement Required Braces or Orthoses: Yes Spinal Brace: Thoracolumbosacral orthotic Prior Functioning  Home Living Lives With: Alone Receives Help From: Family (intermitently) Type of Home: House Home Layout: One level;Full bath on main level Home Access: Stairs to enter Entrance Stairs-Rails: Right Entrance Stairs-Number of Steps: 2 Bathroom Shower/Tub: Engineer, manufacturing systems: Standard Home Adaptive Equipment: Bedside commode/3-in-1;Walker - rolling (tub seat) Prior Function Level of Independence: Independent with basic ADLs;Independent with homemaking with ambulation Able to Take Stairs?: Yes Driving: No Cognition Cognition Arousal/Alertness: Lethargic Overall Cognitive Status: Appears within functional limits for tasks assessed Sensation/Coordination Coordination Gross Motor Movements are Fluid and  Coordinated: Yes Extremity Assessment RUE Assessment RUE Assessment: Within Functional Limits LUE Assessment LUE Assessment: Within Functional Limits RLE Assessment RLE Assessment: Within  Functional Limits LLE Assessment LLE Assessment: Within Functional Limits (Bil generally weak due to inactivity in hospital) Mobility (including Balance) Bed Mobility Bed Mobility: Yes Rolling Right: 4: Min assist Rolling Left: 4: Min assist Right Sidelying to Sit: 4: Min assist Transfers Transfers: Yes Sit to Stand: 4: Min assist;From bed Sit to Stand Details (indicate cue type and reason): vc's for hand placement Ambulation/Gait Ambulation/Gait: Yes Ambulation/Gait Assistance:  (min guard A) Ambulation Distance (Feet): 400 Feet Assistive device: Rolling walker Gait Pattern: Within Functional Limits    Exercise    End of Session PT - End of Session Equipment Utilized During Treatment: Back brace Activity Tolerance: Patient tolerated treatment well Patient left: in chair Nurse Communication: Mobility status for ambulation;Mobility status for transfers General Behavior During Session: [facility] for tasks performed  Kate Sable    [phone] 08/24/2011, 5:27 PM

## 2011-08-24 NOTE — Progress Notes (Signed)
Inpatient Diabetes Program Recommendations  AACE/ADA: New Consensus Statement on Inpatient Glycemic Control (2009)  Target Ranges:  Prepandial:   less than 140 mg/dL      Peak postprandial:   less than 180 mg/dL (1-2 hours)      Critically ill patients:  140 - 180 mg/dL   Reason for Visit: Fasting blood sugar greater than 200 mg/dL  Inpatient Diabetes Program Recommendations Insulin - Basal: Please consider addition of Lantus 10 units basal insulin whiel here.  Note: Please consider for hospitalization.

## 2011-08-24 NOTE — Progress Notes (Signed)
ANTICOAGULATION CONSULT NOTE - Initial Consult  Pharmacy Consult for Heparin  Indication: pulmonary embolus  Allergies  Allergen Reactions  . Celebrex (Celecoxib) Swelling  . Morphine Sulfate Nausea Only  . Percocet (Oxycodone-Acetaminophen) Itching    Patient Measurements: Height: 5\' 1"  (154.9 cm) Weight: 160 lb (72.576 kg) IBW/kg (Calculated) : 47.8  Adjusted Body Weight: 64kg  Vital Signs: Temp: 97.9 F (36.6 C) (11/08 1400) BP: 121/48 mmHg (11/08 1400) Pulse Rate: 87  (11/08 1400)  Labs:  Basename 08/24/11 0559 08/23/11 0700 08/22/11 0730  HGB 9.5* -- --  HCT 28.3* -- --  PLT 144* -- --  APTT -- -- 30  LABPROT -- -- 13.7  INR -- -- 1.03  HEPARINUNFRC -- -- --  CREATININE 0.67 0.64 --  CKTOTAL -- -- --  CKMB -- -- --  TROPONINI -- -- --   Estimated Creatinine Clearance: 51.1 ml/min (by C-G formula based on Cr of 0.67).  Medical History: Past Medical History  Diagnosis Date  . Osteoarthritis   . Degenerative joint disease   . Lumbar spinal stenosis   . Diabetes mellitus, type 2   . Hypertension   . Gout   . GERD (gastroesophageal reflux disease)   . Fundic gland polyps of stomach, benign   . Helicobacter pylori gastritis   . Shingles     current shingles- x4 yrs., on L side of face     . Hiatal hernia     colonoscopy & endoscopy- 2 wks. ago, diag /w small hiatal hernia   . Cancer     melanoma- R arm , area excised     Medications:  Scheduled:    . amLODipine  5 mg Oral Daily  . glimepiride  2 mg Oral BID WC  . HYDROmorphone PCA 0.3 mg/mL   Intravenous Q4H  . insulin aspart  0-15 Units Subcutaneous TID WC  . metFORMIN  500 mg Oral BID  . pantoprazole  40 mg Oral Q1200  . ramipril  10 mg Oral Daily  . DISCONTD: enoxaparin (LOVENOX) injection  40 mg Subcutaneous Q24H    Assessment: 82 yof s/p CT angiogram for hypoxia and scan reveals RLL PE.  Orders to start full-dose heparin per pharmacy.  She is POD#2 for spinal fusion.  She is currently on  enoxaparin 40mg  SQ q2200.  Her Hgb this am = 9.5 and platelets =144. Both are decreased from baseline. No bleeding is noted  Goal of Therapy:  Heparin level 0.3-0.7 units/ml   Plan:  1. Due to recent surgery will avoid heparin bolus. Start heparin infusion at 1000 units/hr 2. Check 8h heparin level 3. Daily heparin levels and CBC 4. Await orders to start warfarin.  5. Monitor Hgb/Hct and platelets.   Dannielle Huh 08/24/2011,8:06 PM

## 2011-08-24 NOTE — Progress Notes (Signed)
Subjective: 2 Days Post-Op Procedure(s) (LRB): FUSION POSTERIOR SPINAL MULTILEVEL/SCOLIOSIS (N/A) Patient reports pain as 8 on 0-10 scale and moderate.    Objective: Vital signs in last 24 hours: Temp:  [99 F (37.2 C)-100.7 F (38.2 C)] 99 F (37.2 C) (11/08 0530) Pulse Rate:  [75-96] 91  (11/08 0530) Resp:  [18-20] 18  (11/08 0740) BP: (124-147)/(40-56) 147/51 mmHg (11/08 0530) SpO2:  [94 %-96 %] 96 % (11/08 0740) Chest xray neg for active pathology.  Intake/Output from previous day: 11/07 0701 - 11/08 0700 In: 720 [P.O.:720] Out: 1650 [Urine:1150; Drains:500] Intake/Output this shift:     Basename 08/24/11 0559  HGB 9.5*    Basename 08/24/11 0559  WBC 10.6*  RBC 3.23*  HCT 28.3*  PLT 144*    Basename 08/24/11 0559 08/23/11 0700  NA 135 135  K 3.6 4.4  CL 101 100  CO2 25 24  BUN 11 8  CREATININE 0.67 0.64  GLUCOSE 196* 183*  CALCIUM 8.7 8.7    Basename 08/22/11 0730  LABPT --  INR 1.03    Neurologically intact Neurovascular intact Sensation intact distally Dorsiflexion/Plantar flexion intact Incision: dressing C/D/I  Assessment/Plan: 2 Days Post-Op Procedure(s) (LRB): FUSION POSTERIOR SPINAL MULTILEVEL/SCOLIOSIS (N/A) Up with therapy  D/C foley Magic mouth wash swish and swallow prn. D/C drains applied c/d dressing. Plan d/c pca in am. Start norco 10mg  1-2 q4 prn moderate pain.  COLLINS,EMMA [name] 08/24/2011, 8:18 AM

## 2011-08-24 NOTE — Progress Notes (Signed)
Subjective: Doing ok, still requiring oxygen, c/o some discomfort with deep inspiration  Objective: Vital signs in last 24 hours: Temp:  [99 F (37.2 C)-100.7 F (38.2 C)] 99 F (37.2 C) (11/08 0530) Pulse Rate:  [75-96] 91  (11/08 0530) Resp:  [18-20] 18  (11/08 1207) BP: (124-147)/(40-56) 147/51 mmHg (11/08 0530) SpO2:  [93 %-96 %] 95 % (11/08 1207) Weight change:  Last BM Date: 08/21/11  Intake/Output from previous day: 11/07 0701 - 11/08 0700 In: 720 [P.O.:720] Out: 1650 [Urine:1150; Drains:500]     Physical Exam: General: Alert, awake, oriented x3, in no acute distress. HEENT: No bruits, no goiter. Heart: Regular rate and rhythm, without murmurs, rubs, gallops. Lungs: Clear to auscultation bilaterally. Abdomen: Soft, nontender, nondistended, positive bowel sounds. Extremities: No clubbing cyanosis or edema with positive pedal pulses. Neuro: Grossly intact, nonfocal.    Lab Results: Basic Metabolic Panel:  Basename 08/24/11 0559 08/23/11 0700  NA 135 135  K 3.6 4.4  CL 101 100  CO2 25 24  GLUCOSE 196* 183*  BUN 11 8  CREATININE 0.67 0.64  CALCIUM 8.7 8.7  MG -- --  PHOS -- --   Liver Function Tests: No results found for this basename: AST:2,ALT:2,ALKPHOS:2,BILITOT:2,PROT:2,ALBUMIN:2 in the last 72 hours No results found for this basename: LIPASE:2,AMYLASE:2 in the last 72 hours No results found for this basename: AMMONIA:2 in the last 72 hours CBC:  Basename 08/24/11 0559 08/22/11 0730  WBC 10.6* --  NEUTROABS -- 8.0*  HGB 9.5* --  HCT 28.3* --  MCV 87.6 --  PLT 144* --   Cardiac Enzymes: No results found for this basename: CKTOTAL:3,CKMB:3,CKMBINDEX:3,TROPONINI:3 in the last 72 hours BNP: No results found for this basename: POCBNP:3 in the last 72 hours D-Dimer: No results found for this basename: DDIMER:2 in the last 72 hours CBG:  Basename 08/24/11 1133 08/24/11 0649 08/23/11 2135 08/23/11 1640 08/23/11 1149 08/23/11 0852  GLUCAP 164*  207* 212* 171* 238* 204*   Hemoglobin A1C: No results found for this basename: HGBA1C in the last 72 hours Fasting Lipid Panel: No results found for this basename: CHOL,HDL,LDLCALC,TRIG,CHOLHDL,LDLDIRECT in the last 72 hours Thyroid Function Tests: No results found for this basename: TSH,T4TOTAL,FREET4,T3FREE,THYROIDAB in the last 72 hours Anemia Panel: No results found for this basename: VITAMINB12,FOLATE,FERRITIN,TIBC,IRON,RETICCTPCT in the last 72 hours Coagulation:  Basename 08/22/11 0730  LABPROT 13.7  INR 1.03   Recent Results (from the past 240 hour(s))  SURGICAL PCR SCREEN     Status: Normal   Collection Time   08/18/11  1:22 PM      Component Value Range Status Comment   MRSA, PCR NEGATIVE  NEGATIVE  Final    Staphylococcus aureus NEGATIVE  NEGATIVE  Final     Studies/Results: Dg Chest 2 View  08/23/2011  *RADIOLOGY REPORT*  Clinical Data: Chest pain.  Shortness of breath.  Hypoxia.  CHEST - 2 VIEW  Comparison: 08/18/2011.  Findings: Very poor inspiration.  No change in chronic elevation of the right hemidiaphragm.  The cardiac silhouette remains near the upper limit of normal in size.  The lungs are clear.  Diffuse osteopenia.  Thoracolumbar spine fixation hardware. Cholecystectomy clips.  IMPRESSION: Very poor inspiration.  No acute abnormality.  Original Report Authenticated By: Darrol Angel, M.D.   Dg Lumbar Spine Complete  08/22/2011  *RADIOLOGY REPORT*  Clinical Data: Postop lumbar fusion.  LUMBAR SPINE - COMPLETE 4+ VIEW  Comparison: CT 12/13/2010.  Findings: Four intraoperative views are submitted postoperatively from the operating room.  Image #1 at 0850 hours demonstrates a posterior localizing needle overlying the L1 spinous process.  The L1-L4 pedicle screws and interconnecting rods appear unchanged.  Image #2 at 1029 hours demonstrates the placement of bilateral pedicle screws at T11 and T12.  Image #3 at 1124 hours demonstrates the placement of interconnecting  rods extending superiorly.  There are bilateral laminar hooks at T10.  Hardware appears well positioned.  The final view is a prone view at 1125 hours and demonstrates the hardware to be well positioned.  IMPRESSION: Intraoperative views during revision of lumbar fusion, now extending from T10-L4.  No demonstrated complication.  Original Report Authenticated By: Gerrianne Scale, M.D.    Medications: Scheduled Meds:   . amLODipine  5 mg Oral Daily  . enoxaparin (LOVENOX) injection  40 mg Subcutaneous Q24H  . glimepiride  2 mg Oral BID WC  . HYDROmorphone PCA 0.3 mg/mL   Intravenous Q4H  . insulin aspart  0-15 Units Subcutaneous TID WC  . metFORMIN  500 mg Oral BID  . pantoprazole  40 mg Oral Q1200  . ramipril  10 mg Oral Daily  . DISCONTD: HYDROmorphone PCA 0.3 mg/mL   Intravenous Q4H   Continuous Infusions:   . sodium chloride 100 mL/hr at 08/22/11 1940   PRN Meds:.diphenhydrAMINE, diphenhydrAMINE, HYDROcodone-acetaminophen, HYDROmorphone (DILAUDID) injection, magic mouthwash, methocarbamol(ROBAXIN) IV, methocarbamol, metoCLOPramide (REGLAN) injection, metoCLOPramide, naloxone, ondansetron (ZOFRAN) IV, ondansetron (ZOFRAN) IV, ondansetron, polyvinyl alcohol, senna-docusate, sodium chloride  Assessment/Plan:  Active Problems:  Hypoxia: Most likely secondary to atelectasis and poor inspiratory effort however due to the fact that she still requiring oxygen N. complaints of some discomfort with deep inspiration will get a CT in June of the chest to rule out PE. Continue DVT prophylaxis with Lovenox continue incentive spirometer every hour when awake, out of bed to chair as tolerated and wean PCA pump.  Spinal stenosis of lumbar region at multiple levels: Per Dr. Alveda Reasons  Diabetes mellitus    LOS: 2 days   Reggie Bise 08/24/2011, 12:18 PM

## 2011-08-25 DIAGNOSIS — I2699 Other pulmonary embolism without acute cor pulmonale: Secondary | ICD-10-CM | POA: Diagnosis not present

## 2011-08-25 LAB — CBC
HCT: 29 % — ABNORMAL LOW (ref 36.0–46.0)
Hemoglobin: 9.7 g/dL — ABNORMAL LOW (ref 12.0–15.0)
RBC: 3.31 MIL/uL — ABNORMAL LOW (ref 3.87–5.11)
WBC: 11 10*3/uL — ABNORMAL HIGH (ref 4.0–10.5)

## 2011-08-25 LAB — HEPARIN LEVEL (UNFRACTIONATED): Heparin Unfractionated: <0.1 IU/mL — ABNORMAL LOW (ref 0.30–0.70)

## 2011-08-25 LAB — BASIC METABOLIC PANEL
BUN: 9 mg/dL (ref 6–23)
Chloride: 102 mEq/L (ref 96–112)
GFR calc Af Amer: >90 mL/min (ref >90)
GFR calc non Af Amer: 84 mL/min — ABNORMAL LOW (ref >90)
Potassium: 3.6 mEq/L (ref 3.5–5.1)
Sodium: 137 mEq/L (ref 135–145)

## 2011-08-25 MED ORDER — WARFARIN SODIUM 5 MG PO TABS
5.0000 mg | ORAL_TABLET | Freq: Once | ORAL | Status: AC
  Administered 2011-08-25: 5 mg via ORAL
  Filled 2011-08-25: qty 1

## 2011-08-25 MED ORDER — HEPARIN (PORCINE) IN NACL 100-0.45 UNIT/ML-% IJ SOLN: 1250.0000 [IU]/h | INTRAMUSCULAR | Status: AC

## 2011-08-25 MED ORDER — ENOXAPARIN SODIUM 80 MG/0.8ML ~~LOC~~ SOLN
70.0000 mg | Freq: Two times a day (BID) | SUBCUTANEOUS | Status: DC
  Administered 2011-08-25: 17:00:00 via SUBCUTANEOUS
  Administered 2011-08-26 (×2): 70 mg via SUBCUTANEOUS
  Administered 2011-08-27: 80 mg via SUBCUTANEOUS
  Administered 2011-08-27 – 2011-08-28 (×2): 70 mg via SUBCUTANEOUS
  Administered 2011-08-28: 11:00:00 via SUBCUTANEOUS
  Administered 2011-08-29: 70 mg via SUBCUTANEOUS
  Filled 2011-08-25 (×14): qty 0.8

## 2011-08-25 MED ORDER — BISACODYL 5 MG PO TBEC
5.0000 mg | DELAYED_RELEASE_TABLET | Freq: Every day | ORAL | Status: DC | PRN
  Administered 2011-08-25: 5 mg via ORAL
  Filled 2011-08-25: qty 2
  Filled 2011-08-25: qty 1

## 2011-08-25 NOTE — Consults (Signed)
ANTICOAGULATION CONSULT NOTE - Initial Consult  Pharmacy Consult for: Change Heparin to Lovenox, Coumadin Indication: pulmonary embolus  Allergies  Allergen Reactions  . Celebrex (Celecoxib) Swelling  . Morphine Sulfate Nausea Only  . Percocet (Oxycodone-Acetaminophen) Itching    Patient Measurements: Height: 5\' 1"  (154.9 cm) Weight: 160 lb (72.576 kg) IBW/kg (Calculated) : 47.8    Vital Signs: Temp: 97.8 F (36.6 C) (11/09 0606) BP: 167/66 mmHg (11/09 0606) Pulse Rate: 87  (11/09 0606)  Labs:  [name] 08/25/11 0852 08/25/11 0625 08/24/11 2046 08/24/11 0559 08/23/11 0700  HGB -- 9.7* -- 9.5* --  HCT -- 29.0* -- 28.3* --  PLT -- 177 -- 144* --  APTT -- -- 33 -- --  LABPROT -- -- 15.7* -- --  INR -- -- 1.22 -- --  HEPARINUNFRC <0.10* <0.10* -- -- --  CREATININE -- 0.59 -- 0.67 0.64  CKTOTAL -- -- -- -- --  CKMB -- -- -- -- --  TROPONINI -- -- -- -- --   Estimated Creatinine Clearance: 51.1 ml/min (by C-G formula based on Cr of 0.59).  Medical History: Past Medical History  Diagnosis Date  . Osteoarthritis   . Degenerative joint disease   . Lumbar spinal stenosis   . Diabetes mellitus, type 2   . Hypertension   . Gout   . GERD (gastroesophageal reflux disease)   . Fundic gland polyps of stomach, benign   . Helicobacter pylori gastritis   . Shingles     current shingles- x4 yrs., on L side of face     . Hiatal hernia     colonoscopy & endoscopy- 2 wks. ago, diag /w small hiatal hernia   . Cancer     melanoma- R arm , area excised     Medications: Infusions:    . sodium chloride 1,000 mL (08/25/11 0256)  . DISCONTD: heparin 1,000 Units/hr (08/24/11 2111)    Assessment: Pulmonary embolism: As the patient is hemodynamically stable, Heparin  will be changed Lovenox and Coumadin added for long-term therapy (3 - 6 months).  Goal of Therapy:  INR 2-3 Lovenox bridge, 1mg /kg/dose q 12 hrs.  No renal adjustment is required.   Plan: D/C Heparin infusion  today at 1700pm (to allow convenient Lovenox administration times). One (1) hour after Heparin d/c'd, begin Lovenox 70mg  sq q 12hrs. Coumadin 5mg  po today. Daily PT/INR.  Jackelyne Sayer, Elisha Headland, Pharm.D. 08/25/2011 1:10 PM

## 2011-08-25 NOTE — Progress Notes (Signed)
ANTICOAGULATION CONSULT NOTE - Follow Up Consult  Pharmacy Consult for Heparin Indication: pulmonary embolus  Allergies  Allergen Reactions  . Celebrex (Celecoxib) Swelling  . Morphine Sulfate Nausea Only  . Percocet (Oxycodone-Acetaminophen) Itching    Patient Measurements: Height: 5\' 1"  (154.9 cm) Weight: 160 lb (72.576 kg) IBW/kg (Calculated) : 47.8    Vital Signs: Temp: 97.8 F (36.6 C) (11/09 0606) BP: 167/66 mmHg (11/09 0606) Pulse Rate: 87  (11/09 0606)  Labs:  [name] 08/25/11 0852 08/25/11 0625 08/24/11 2046 08/24/11 0559 08/23/11 0700  HGB -- 9.7* -- 9.5* --  HCT -- 29.0* -- 28.3* --  PLT -- 177 -- 144* --  APTT -- -- 33 -- --  LABPROT -- -- 15.7* -- --  INR -- -- 1.22 -- --  HEPARINUNFRC <0.10* <0.10* -- -- --  CREATININE -- 0.59 -- 0.67 0.64  CKTOTAL -- -- -- -- --  CKMB -- -- -- -- --  TROPONINI -- -- -- -- --   Estimated Creatinine Clearance: 51.1 ml/min (by C-G formula based on Cr of 0.59).   Medications:  Infusions:    . sodium chloride 1,000 mL (08/25/11 0256)  . heparin 1,000 Units/hr (08/24/11 2111)    Assessment: 82 yof s/p CT angiogram for hypoxia and  + RLL PE.   She is POD#2 for spinal fusion.  Heparin level (and redraw)  < 0.1.   Goal of Therapy:  Heparin level 0.3-0.7 units/ml   Plan:  Increase Heparin infusion rate 1250 units/hr.   Follow-up 8 hour Heparin level.  Sway Guttierrez, Elisha Headland, Pharm.D. 08/25/2011 10:30 AM

## 2011-08-25 NOTE — Progress Notes (Signed)
Subjective: No SOB today.  She has not had a BM and would like a laxative for assistants.     Objective: Vital signs in last 24 hours: Temp:  [97.8 F (36.6 C)-98.7 F (37.1 C)] 97.8 F (36.6 C) (11/09 0606) Pulse Rate:  [87-90] 87  (11/09 0606) Resp:  [16-20] 18  (11/09 0606) BP: (121-167)/(48-68) 167/66 mmHg (11/09 0606) SpO2:  [90 %-98 %] 96 % (11/09 0606) Weight:  [72.576 kg (160 lb)] 160 lb (72.576 kg) (11/08 1500)  Intake/Output from previous day: 11/08 0701 - 11/09 0700 In: 1756.2 [P.O.:480; I.V.:1241.2] Out: -  Intake/Output this shift:     Basename 08/25/11 0625 08/24/11 0559  HGB 9.7* 9.5*    Basename 08/25/11 0625 08/24/11 0559  WBC 11.0* 10.6*  RBC 3.31* 3.23*  HCT 29.0* 28.3*  PLT 177 144*    Basename 08/24/11 0559 08/23/11 0700  NA 135 135  K 3.6 4.4  CL 101 100  CO2 25 24  BUN 11 8  CREATININE 0.67 0.64  GLUCOSE 196* 183*  CALCIUM 8.7 8.7    Basename 08/24/11 2046  LABPT --  INR 1.22    Neurovascular intact Dorsiflexion/Plantar flexion intact Incision: dressing C/D/I Abdomin slight pain to palpation, soft to palpation.  Assessment/Plan:S/P Lumbar fusion L2-4 PE post-op D/c pca laxitive prn for constipation.   [name] 08/25/2011, 7:56 AM

## 2011-08-25 NOTE — Progress Notes (Signed)
Subjective: Upset about blood clot, feels weak overall  Objective: Vital signs in last 24 hours: Temp:  [97.8 F (36.6 C)-98.7 F (37.1 C)] 97.8 F (36.6 C) (11/09 0606) Pulse Rate:  [87-90] 87  (11/09 0606) Resp:  [16-20] 18  (11/09 0606) BP: (121-167)/(48-68) 167/66 mmHg (11/09 0606) SpO2:  [90 %-98 %] 96 % (11/09 0606) Weight:  [72.576 kg (160 lb)] 160 lb (72.576 kg) (11/08 1500) Weight change:  Last BM Date: 08/21/11  Intake/Output from previous day: 11/08 0701 - 11/09 0700 In: 1756.2 [P.O.:480; I.V.:1241.2] Out: -      Physical Exam: General: Alert, awake, oriented x3, in no acute distress. HEENT: No bruits, no goiter. Heart: Regular rate and rhythm, without murmurs, rubs, gallops. Lungs: Clear to auscultation bilaterally. Abdomen: Soft, nontender, nondistended, positive bowel sounds. Extremities: No clubbing cyanosis or edema with positive pedal pulses. Neuro: Grossly intact, nonfocal.    Lab Results: Basic Metabolic Panel:  Basename 08/25/11 0625 08/24/11 0559  NA 137 135  K 3.6 3.6  CL 102 101  CO2 22 25  GLUCOSE 176* 196*  BUN 9 11  CREATININE 0.59 0.67  CALCIUM 8.8 8.7  MG -- --  PHOS -- --   Liver Function Tests: No results found for this basename: AST:2,ALT:2,ALKPHOS:2,BILITOT:2,PROT:2,ALBUMIN:2 in the last 72 hours No results found for this basename: LIPASE:2,AMYLASE:2 in the last 72 hours No results found for this basename: AMMONIA:2 in the last 72 hours CBC:  Basename 08/25/11 0625 08/24/11 0559  WBC 11.0* 10.6*  NEUTROABS -- --  HGB 9.7* 9.5*  HCT 29.0* 28.3*  MCV 87.6 87.6  PLT 177 144*   Cardiac Enzymes: No results found for this basename: CKTOTAL:3,CKMB:3,CKMBINDEX:3,TROPONINI:3 in the last 72 hours BNP: No results found for this basename: POCBNP:3 in the last 72 hours D-Dimer: No results found for this basename: DDIMER:2 in the last 72 hours CBG:  Basename 08/25/11 0640 08/24/11 2143 08/24/11 1654 08/24/11 1133 08/24/11  0649 08/23/11 2135  GLUCAP 168* 156* 176* 164* 207* 212*   Hemoglobin A1C: No results found for this basename: HGBA1C in the last 72 hours Fasting Lipid Panel: No results found for this basename: CHOL,HDL,LDLCALC,TRIG,CHOLHDL,LDLDIRECT in the last 72 hours Thyroid Function Tests: No results found for this basename: TSH,T4TOTAL,FREET4,T3FREE,THYROIDAB in the last 72 hours Anemia Panel: No results found for this basename: VITAMINB12,FOLATE,FERRITIN,TIBC,IRON,RETICCTPCT in the last 72 hours Coagulation:  Basename 08/24/11 2046  LABPROT 15.7*  INR 1.22     Recent Results (from the past 240 hour(s))  SURGICAL PCR SCREEN     Status: Normal   Collection Time   08/18/11  1:22 PM      Component Value Range Status Comment   MRSA, PCR NEGATIVE  NEGATIVE  Final    Staphylococcus aureus NEGATIVE  NEGATIVE  Final     Studies/Results: Dg Chest 2 View  08/23/2011  *RADIOLOGY REPORT*  Clinical Data: Chest pain.  Shortness of breath.  Hypoxia.  CHEST - 2 VIEW  Comparison: 08/18/2011.  Findings: Very poor inspiration.  No change in chronic elevation of the right hemidiaphragm.  The cardiac silhouette remains near the upper limit of normal in size.  The lungs are clear.  Diffuse osteopenia.  Thoracolumbar spine fixation hardware. Cholecystectomy clips.  IMPRESSION: Very poor inspiration.  No acute abnormality.  Original Report Authenticated By: Darrol Angel, M.D.   Ct Angio Chest W/cm &/or Wo Cm  08/24/2011  *RADIOLOGY REPORT*  Clinical Data:  Short of breath  CT ANGIOGRAPHY CHEST WITH CONTRAST  Technique:  Multidetector  CT imaging of the chest was performed using the standard protocol during bolus administration of intravenous contrast.  Multiplanar CT image reconstructions including MIPs were obtained to evaluate the vascular anatomy.  Contrast: OMNIPAQUE IOHEXOL 300 MG/ML IV SOLN  Comparison:  None  Findings:  There is filling defect extending from the main right pulmonary artery into the   segmental branches of the basilar right lower lobe.  There are filling defect in more peripheral phalanges in the right lower lobe as well.  Patchy ground-glass opacities throughout the right lung.  Minimal dependent atelectasis at the lung bases.  No pneumothorax and no pleural effusion  Thoracolumbar fusion.  No compression deformity.  Mild ectasia of the aorta.  Review of the MIP images confirms the above findings.  IMPRESSION: Study is positive for acute pulmonary thromboembolism in the right lower lobe.  Patchy airspace opacities in the right lung are noted.  Original Report Authenticated By: Donavan Burnet, M.D.    Medications: Scheduled Meds:   . amLODipine  5 mg Oral Daily  . glimepiride  2 mg Oral BID WC  . insulin aspart  0-15 Units Subcutaneous TID WC  . metFORMIN  500 mg Oral BID  . pantoprazole  40 mg Oral Q1200  . ramipril  10 mg Oral Daily  . DISCONTD: enoxaparin (LOVENOX) injection  40 mg Subcutaneous Q24H  . DISCONTD: HYDROmorphone PCA 0.3 mg/mL   Intravenous Q4H   Continuous Infusions:   . sodium chloride 1,000 mL (08/25/11 0256)  . heparin 1,000 Units/hr (08/24/11 2111)   PRN Meds:.bisacodyl, HYDROcodone-acetaminophen, HYDROmorphone (DILAUDID) injection, iohexol, magic mouthwash, methocarbamol(ROBAXIN) IV, methocarbamol, metoCLOPramide (REGLAN) injection, metoCLOPramide, ondansetron (ZOFRAN) IV, ondansetron, polyvinyl alcohol, senna-docusate, DISCONTD: diphenhydrAMINE, DISCONTD: diphenhydrAMINE, DISCONTD: naloxone, DISCONTD: ondansetron (ZOFRAN) IV, DISCONTD: sodium chloride  Assessment/Plan:  1. Pulmonary embolism: An IV heparin yesterday, she is hemodynamically stable will change it over to Lovenox and Coumadin to start tonight. She will need Coumadin for 3-6 months since this is a postop pulmonary embolism. 2. Diabetes: Stable continue metformin and glimepiride 3. status post lumbar fusion for spinal stenosis stable continue management per doctor and  [place] Disposition: Hopefully home in the next 24-48 hours will need close followup with Dr. Selena Batten her primary care physician for INR check early next week.  LOS: 3 days   Makalya Nave 08/25/2011, 11:28 AM

## 2011-08-25 NOTE — Progress Notes (Signed)
Physical Therapy Treatment Patient Details Name: AVILENE MARRIN MRN: [id number] DOB: Oct 14, 1928 Today's Date: 08/25/2011  PT Assessment/Plan  PT - Assessment/Plan Comments on Treatment Session: Spoke with Marisue Humble PAC. Pt okay to ambulate with PT after results found RLL PE. Pt on heperin drip. Pt had no sign of SOB or fatique with increased activity. Pt required total A to don/dof brace. PT Plan: Discharge plan remains appropriate PT Frequency: Min 5X/week Follow Up Recommendations: Home health PT Equipment Recommended: None recommended by PT PT Goals  Acute Rehab PT Goals PT Goal: Supine/Side to Sit - Progress: Progressing toward goal PT Transfer Goal: Sit to Stand/Stand to Sit - Progress: Progressing toward goal PT Goal: Ambulate - Progress: Progressing toward goal  PT Treatment Precautions/Restrictions  Precautions Precautions: Back Precaution Booklet Issued: No Precaution Comments: instructed in precautions, pt needs reinforcement Required Braces or Orthoses: Yes Spinal Brace: Thoracolumbosacral orthotic;Applied in sitting position;Other (comment) Spinal Brace Comments: Spoke with Marisue Humble, [facility] and she stated that patient can don/dof brace in sitting. Pt also ok to go to restroom and sit in chair without brace. RN made aware.  Restrictions Weight Bearing Restrictions: No Mobility (including Balance) Bed Mobility Bed Mobility: Yes Supine to Sit: 5: Supervision Supine to Sit Details (indicate cue type and reason): Cues for safe technique and to protect back precautions.  Transfers Sit to Stand: 5: Supervision;From chair/3-in-1 Sit to Stand Details (indicate cue type and reason): Cues for safe hand placement Stand to Sit: 5: Supervision;To bed Stand to Sit Details: Cues for safe positioning Ambulation/Gait Ambulation/Gait: Yes Ambulation/Gait Assistance: Other (comment) (MinGuard A) Ambulation Distance (Feet): 350 Feet Assistive device: Rolling walker Gait Pattern: Within  Functional Limits Gait velocity: NT Stairs: No Wheelchair Mobility Wheelchair Mobility: No    Exercise    End of Session PT - End of Session Equipment Utilized During Treatment: Gait belt;Back brace Activity Tolerance: Patient tolerated treatment well Patient left: in bed;with call bell in reach Nurse Communication: Mobility status for transfers;Mobility status for ambulation General Behavior During Session: [facility] for tasks performed Cognition: [facility] for tasks performed  Robinette, Adline Potter 08/25/2011, 1:20 PM 08/25/2011 Fredrich Birks PTA [phone] pager [phone] office

## 2011-08-26 LAB — CBC
MCH: 29.4 pg (ref 26.0–34.0)
MCV: 87.5 fL (ref 78.0–100.0)
Platelets: 191 10*3/uL (ref 150–400)
RBC: 2.96 MIL/uL — ABNORMAL LOW (ref 3.87–5.11)
RDW: 12.9 % (ref 11.5–15.5)
WBC: 8.3 10*3/uL (ref 4.0–10.5)

## 2011-08-26 LAB — BASIC METABOLIC PANEL
CO2: 27 mEq/L (ref 19–32)
Calcium: 8.9 mg/dL (ref 8.4–10.5)
Creatinine, Ser: 0.6 mg/dL (ref 0.50–1.10)
GFR calc Af Amer: >90 mL/min (ref >90)
Sodium: 139 mEq/L (ref 135–145)

## 2011-08-26 LAB — GLUCOSE, CAPILLARY: Glucose-Capillary: 154 mg/dL — ABNORMAL HIGH (ref 70–99)

## 2011-08-26 LAB — PROTIME-INR
INR: 1.35 (ref 0.00–1.49)
Prothrombin Time: 16.9 seconds — ABNORMAL HIGH (ref 11.6–15.2)

## 2011-08-26 MED ORDER — POTASSIUM CHLORIDE CRYS ER 20 MEQ PO TBCR
40.0000 meq | EXTENDED_RELEASE_TABLET | Freq: Once | ORAL | Status: AC
  Administered 2011-08-26: 40 meq via ORAL
  Filled 2011-08-26 (×2): qty 1

## 2011-08-26 MED ORDER — BISACODYL 10 MG RE SUPP
10.0000 mg | Freq: Every day | RECTAL | Status: DC | PRN
  Administered 2011-08-26 – 2011-08-27 (×2): 10 mg via RECTAL
  Filled 2011-08-26 (×2): qty 1

## 2011-08-26 MED ORDER — WARFARIN SODIUM 5 MG PO TABS
5.0000 mg | ORAL_TABLET | Freq: Once | ORAL | Status: AC
  Administered 2011-08-26: 5 mg via ORAL
  Filled 2011-08-26: qty 1

## 2011-08-26 MED ORDER — POLYETHYLENE GLYCOL 3350 17 G PO PACK
17.0000 g | PACK | Freq: Every day | ORAL | Status: DC
  Administered 2011-08-26 – 2011-08-29 (×4): 17 g via ORAL
  Filled 2011-08-26 (×4): qty 1

## 2011-08-26 NOTE — Progress Notes (Signed)
Physical Therapy Treatment Patient Details Name: [name] MRN: [id number] DOB: March 31, 1928 Today's Date: 08/26/2011  PT Assessment/Plan  PT - Assessment/Plan Comments on Treatment Session: Pt. progressing well. Noted patient ambulating alone in her room bending over to gather items and suitcase. Pt reeducation and given the importance of back precautions. Pt seems to disregard this information. Will plan on seeing patient to review precautions again to optimize her safety before DC home. Pt declined to practice donning and doffing brace at this time as well. PT Plan: Discharge plan remains appropriate PT Frequency: Min 5X/week Follow Up Recommendations: Home health PT Equipment Recommended: None recommended by PT PT Goals  Acute Rehab PT Goals PT Transfer Goal: Sit to Stand/Stand to Sit - Progress: Progressing toward goal PT Transfer Goal: Bed to Chair/Chair to Bed - Progress: Progressing toward goal PT Goal: Ambulate - Progress: Met PT Goal: Up/Down Stairs - Progress: Progressing toward goal  PT Treatment Precautions/Restrictions  Precautions Precautions: Back Precaution Booklet Issued: No Precaution Comments: Pt able to verbalize all precautions but has difficult time following with mobility Required Braces or Orthoses: Yes Spinal Brace: Lumbar corset;Applied in sitting position Spinal Brace Comments: Spoke with Marisue Humble, PAC and she stated that patient can don/dof brace in sitting. Pt also ok to go to restroom and sit in chair without brace. RN made aware.  Restrictions Weight Bearing Restrictions: No Mobility (including Balance) Bed Mobility Bed Mobility: No Supine to Sit Details (indicate cue type and reason): Pt declined practice this session.  Transfers Transfers: Yes Sit to Stand: 6: Modified independent (Device/Increase time) Stand to Sit: 6: Modified independent (Device/Increase time) Ambulation/Gait Ambulation/Gait: Yes Ambulation/Gait Assistance: 6: Modified  independent (Device/Increase time) Ambulation Distance (Feet): 250 Feet Assistive device: Rolling walker Gait Pattern: Within Functional Limits Stairs: Yes Stairs Assistance: 5: Supervision Stairs Assistance Details (indicate cue type and reason): Cues for safe technique Stair Management Technique: Two rails;Alternating pattern Number of Stairs: 5     Exercise    End of Session PT - End of Session Equipment Utilized During Treatment: Gait belt;Back brace Activity Tolerance: Patient tolerated treatment well Patient left: in chair;with call bell in reach Nurse Communication: Mobility status for transfers General Behavior During Session: [facility] for tasks performed Cognition: [facility] for tasks performed  [name], Adline Potter 08/26/2011, 1:25 PM 08/26/2011 Fredrich Birks PTA [phone] pager [phone] office

## 2011-08-26 NOTE — Progress Notes (Signed)
Subjective: 4 Days Post-Op Procedure(s) (LRB): FUSION POSTERIOR SPINAL MULTILEVEL/SCOLIOSIS (N/A) Patient reports pain as moderate.  The pain is ruq abdominal pain, cannot tell me whether it is crampy etc.  Seems to be increased with deep inspiration. Objective: Vital signs in last 24 hours: Temp:  [97.8 F (36.6 C)-97.9 F (36.6 C)] 97.8 F (36.6 C) (11/10 0611) Pulse Rate:  [76-89] 76  (11/10 0611) Resp:  [18] 18  (11/10 0611) BP: (108-155)/(36-52) 108/36 mmHg (11/10 0611) SpO2:  [97 %-100 %] 100 % (11/10 0611)  Intake/Output from previous day: 11/09 0701 - 11/10 0700 In: 480 [P.O.:480] Out: 2 [Urine:2] Intake/Output this shift:     Basename 08/26/11 0600 08/25/11 0625 08/24/11 0559  HGB 8.7* 9.7* 9.5*    Basename 08/26/11 0600 08/25/11 0625  WBC 8.3 11.0*  RBC 2.96* 3.31*  HCT 25.9* 29.0*  PLT 191 177    Basename 08/26/11 0600 08/25/11 0625  NA 139 137  K 3.3* 3.6  CL 103 102  CO2 27 22  BUN 8 9  CREATININE 0.60 0.59  GLUCOSE 88 176*  CALCIUM 8.9 8.8    Basename 08/26/11 0600 08/24/11 2046  LABPT -- --  INR 1.35 1.22    Neurologically intact  Assessment/Plan: 4 Days Post-Op Procedure(s) (LRB): FUSION POSTERIOR SPINAL MULTILEVEL/SCOLIOSIS (N/A) Up with therapyPossible pleuritic pain ==> assess by IM Continue PT,   Fianna Snowball,S Chamika Cunanan 08/26/2011, 8:05 AM

## 2011-08-26 NOTE — Consults (Signed)
ANTICOAGULATION CONSULT NOTE - Initial Consult  Pharmacy Consult for: Lovenox, Coumadin Indication: pulmonary embolus  Allergies  Allergen Reactions  . Celebrex (Celecoxib) Swelling  . Morphine Sulfate Nausea Only  . Percocet (Oxycodone-Acetaminophen) Itching    Patient Measurements: Height: 5\' 1"  (154.9 cm) Weight: 160 lb (72.576 kg) IBW/kg (Calculated) : 47.8    Vital Signs: Temp: 97.8 F (36.6 C) (11/10 0611) BP: 108/36 mmHg (11/10 0611) Pulse Rate: 76  (11/10 0611)  Labs:  Basename 08/26/11 0600 08/25/11 0852 08/25/11 0625 08/24/11 2046 08/24/11 0559  HGB 8.7* -- 9.7* -- --  HCT 25.9* -- 29.0* -- 28.3*  PLT 191 -- 177 -- 144*  APTT -- -- -- 33 --  LABPROT 16.9* -- -- 15.7* --  INR 1.35 -- -- 1.22 --  HEPARINUNFRC -- <0.10* <0.10* -- --  CREATININE 0.60 -- 0.59 -- 0.67  CKTOTAL -- -- -- -- --  CKMB -- -- -- -- --  TROPONINI -- -- -- -- --   Estimated Creatinine Clearance: 51.1 ml/min (by C-G formula based on Cr of 0.6).  Medical History: Past Medical History  Diagnosis Date  . Osteoarthritis   . Degenerative joint disease   . Lumbar spinal stenosis   . Diabetes mellitus, type 2   . Hypertension   . Gout   . GERD (gastroesophageal reflux disease)   . Fundic gland polyps of stomach, benign   . Helicobacter pylori gastritis   . Shingles     current shingles- x4 yrs., on L side of face     . Hiatal hernia     colonoscopy & endoscopy- 2 wks. ago, diag /w small hiatal hernia   . Cancer     melanoma- R arm , area excised     Medications: Infusions:     . sodium chloride 1,000 mL (08/25/11 0256)  . heparin    . DISCONTD: heparin 1,000 Units/hr (08/24/11 2111)    Assessment: Pulmonary embolism: As the patient is hemodynamically stable, Heparin  will be changed Lovenox and Coumadin added for long-term therapy (3 - 6 months).  Goal of Therapy:  INR 2-3 Lovenox bridge, 1mg /kg/dose q 12 hrs.  No renal adjustment is required. Warfarin 5 mg 11/9, will  repeat 5 mg dose 11/10. INR 1.35<---1.22.   Plan: Lovenox 70 mg sq bid per treatment dosing Coumadin 5mg  po today F/u PT/INR 11/11 to determine proper warfarin dose  Shantelle Alles, [place] R, Pharm.D. 08/26/2011 8:13 AM

## 2011-08-26 NOTE — Progress Notes (Signed)
Subjective: Had severe right upper quadrant pain last night worse with deep inspiration, currently improved. Wants to ambulate in the late in the halls today  Objective: Vital signs in last 24 hours: Temp:  [97.8 F (36.6 C)-97.9 F (36.6 C)] 97.8 F (36.6 C) (11/10 0611) Pulse Rate:  [76-89] 76  (11/10 0611) Resp:  [18] 18  (11/10 0611) BP: (108-155)/(36-52) 108/36 mmHg (11/10 0611) SpO2:  [97 %-100 %] 100 % (11/10 1610) Weight change:  Last BM Date: 08/21/11  Intake/Output from previous day: 11/09 0701 - 11/10 0700 In: 480 [P.O.:480] Out: 2 [Urine:2]     Physical Exam: General: Alert, awake, oriented x3, in no acute distress. HEENT: No bruits, no goiter. Heart: Regular rate and rhythm, without murmurs, rubs, gallops. Lungs: Decreased breath sounds at the bases Abdomen: Soft, nontender, nondistended, positive bowel sounds. Extremities: No clubbing cyanosis or edema with positive pedal pulses. Neuro: Grossly intact, nonfocal.  Lab Results: Basic Metabolic Panel:  Basename 08/26/11 0600 08/25/11 0625  NA 139 137  K 3.3* 3.6  CL 103 102  CO2 27 22  GLUCOSE 88 176*  BUN 8 9  CREATININE 0.60 0.59  CALCIUM 8.9 8.8  MG -- --  PHOS -- --   Liver Function Tests: No results found for this basename: AST:2,ALT:2,ALKPHOS:2,BILITOT:2,PROT:2,ALBUMIN:2 in the last 72 hours No results found for this basename: LIPASE:2,AMYLASE:2 in the last 72 hours No results found for this basename: AMMONIA:2 in the last 72 hours CBC:  Basename 08/26/11 0600 08/25/11 0625  WBC 8.3 11.0*  NEUTROABS -- --  HGB 8.7* 9.7*  HCT 25.9* 29.0*  MCV 87.5 87.6  PLT 191 177   Cardiac Enzymes: No results found for this basename: CKTOTAL:3,CKMB:3,CKMBINDEX:3,TROPONINI:3 in the last 72 hours BNP: No results found for this basename: POCBNP:3 in the last 72 hours D-Dimer: No results found for this basename: DDIMER:2 in the last 72 hours CBG:  Basename 08/26/11 0647 08/25/11 2134 08/25/11 1605  08/25/11 1110 08/25/11 0640 08/24/11 2143  GLUCAP 79 226* 105* 210* 168* 156*   Hemoglobin A1C: No results found for this basename: HGBA1C in the last 72 hours Fasting Lipid Panel: No results found for this basename: CHOL,HDL,LDLCALC,TRIG,CHOLHDL,LDLDIRECT in the last 72 hours Thyroid Function Tests: No results found for this basename: TSH,T4TOTAL,FREET4,T3FREE,THYROIDAB in the last 72 hours Anemia Panel: No results found for this basename: VITAMINB12,FOLATE,FERRITIN,TIBC,IRON,RETICCTPCT in the last 72 hours Coagulation:  Basename 08/26/11 0600 08/24/11 2046  LABPROT 16.9* 15.7*  INR 1.35 1.22    Alcohol Level: No results found for this basename: ETH:2 in the last 72 hours  Recent Results (from the past 240 hour(s))  SURGICAL PCR SCREEN     Status: Normal   Collection Time   08/18/11  1:22 PM      Component Value Range Status Comment   MRSA, PCR NEGATIVE  NEGATIVE  Final    Staphylococcus aureus NEGATIVE  NEGATIVE  Final     Studies/Results: Ct Angio Chest W/cm &/or Wo Cm  08/24/2011  *RADIOLOGY REPORT*  Clinical Data:  Short of breath  CT ANGIOGRAPHY CHEST WITH CONTRAST  Technique:  Multidetector CT imaging of the chest was performed using the standard protocol during bolus administration of intravenous contrast.  Multiplanar CT image reconstructions including MIPs were obtained to evaluate the vascular anatomy.  Contrast: OMNIPAQUE IOHEXOL 300 MG/ML IV SOLN  Comparison:  None  Findings:  There is filling defect extending from the main right pulmonary artery into the  segmental branches of the basilar right lower lobe.  There are filling defect in more peripheral phalanges in the right lower lobe as well.  Patchy ground-glass opacities throughout the right lung.  Minimal dependent atelectasis at the lung bases.  No pneumothorax and no pleural effusion  Thoracolumbar fusion.  No compression deformity.  Mild ectasia of the aorta.  Review of the MIP images confirms the above  findings.  IMPRESSION: Study is positive for acute pulmonary thromboembolism in the right lower lobe.  Patchy airspace opacities in the right lung are noted.  Original Report Authenticated By: Donavan Burnet, M.D.    Medications: Scheduled Meds:   . amLODipine  5 mg Oral Daily  . enoxaparin (LOVENOX) injection  70 mg Subcutaneous Q12H  . glimepiride  2 mg Oral BID WC  . insulin aspart  0-15 Units Subcutaneous TID WC  . metFORMIN  500 mg Oral BID  . pantoprazole  40 mg Oral Q1200  . polyethylene glycol  17 g Oral Daily  . potassium chloride  40 mEq Oral Once  . ramipril  10 mg Oral Daily  . warfarin  5 mg Oral ONCE-1800  . warfarin  5 mg Oral ONCE-1800   Continuous Infusions:   . heparin    . DISCONTD: sodium chloride 1,000 mL (08/25/11 0256)  . DISCONTD: heparin 1,000 Units/hr (08/24/11 2111)   PRN bisacodyl, bisacodyl, HYDROcodone-acetaminophen, HYDROmorphone (DILAUDID) injection, magic mouthwash, methocarbamol(ROBAXIN) IV, methocarbamol, metoCLOPramide (REGLAN) injection, metoCLOPramide, ondansetron (ZOFRAN) IV, ondansetron, polyvinyl alcohol, senna-docusate  Assessment/Plan:  1. PE (pulmonary embolism): Postop continue Coumadin with Lovenox bridge the 2 of 5. She will need chronic Coumadin for 3-6 months since this is a postop pulmonary embolism. 2. pleuritic right lower chest and abdominal upper quadrant pain: This is due to her pulmonary embolism which is located in the right lower lung. Will continue narcotics for pain control, expect this to improve in the next 24-48 hours. 3. Spinal stenosis of lumbar region at multiple levels per Dr.Tooke 4. Diabetes mellitus: Continue metformin, glimepiride Ambulate in the halls today, out of bed to chair.    LOS: 4 days   Kaelob Persky 08/26/2011, 11:30 AM

## 2011-08-27 LAB — PROTIME-INR
INR: 2.51 — ABNORMAL HIGH (ref 0.00–1.49)
Prothrombin Time: 27.5 seconds — ABNORMAL HIGH (ref 11.6–15.2)

## 2011-08-27 LAB — CBC
Hemoglobin: 9.3 g/dL — ABNORMAL LOW (ref 12.0–15.0)
Platelets: 231 10*3/uL (ref 150–400)
RBC: 3.21 MIL/uL — ABNORMAL LOW (ref 3.87–5.11)
WBC: 9.4 10*3/uL (ref 4.0–10.5)

## 2011-08-27 LAB — BASIC METABOLIC PANEL
BUN: 10 mg/dL (ref 6–23)
Chloride: 104 mEq/L (ref 96–112)
GFR calc Af Amer: >90 mL/min (ref >90)
GFR calc non Af Amer: 87 mL/min — ABNORMAL LOW (ref >90)
Potassium: 3.6 mEq/L (ref 3.5–5.1)
Sodium: 139 mEq/L (ref 135–145)

## 2011-08-27 LAB — GLUCOSE, CAPILLARY
Glucose-Capillary: 207 mg/dL — ABNORMAL HIGH (ref 70–99)
Glucose-Capillary: 99 mg/dL (ref 70–99)

## 2011-08-27 MED ORDER — DM-GUAIFENESIN ER 30-600 MG PO TB12
1.0000 | ORAL_TABLET | Freq: Two times a day (BID) | ORAL | Status: DC
  Administered 2011-08-27 – 2011-08-29 (×5): 1 via ORAL
  Filled 2011-08-27 (×6): qty 1

## 2011-08-27 NOTE — Consults (Signed)
ANTICOAGULATION CONSULT NOTE - Initial Consult  Pharmacy Consult for: Lovenox, Coumadin Indication: pulmonary embolus  Allergies  Allergen Reactions  . Celebrex (Celecoxib) Swelling  . Morphine Sulfate Nausea Only  . Percocet (Oxycodone-Acetaminophen) Itching    Patient Measurements: Height: 5\' 1"  (154.9 cm) Weight: 160 lb (72.576 kg) IBW/kg (Calculated) : 47.8    Vital Signs: Temp: 98.7 F (37.1 C) (11/11 0618) Temp src: Oral (11/11 0618) BP: 140/57 mmHg (11/11 0618) Pulse Rate: 92  (11/11 0618)  Labs:  Basename 08/27/11 0500 08/26/11 0600 08/25/11 0852 08/25/11 0625 08/24/11 2046  HGB 9.3* 8.7* -- -- --  HCT 27.9* 25.9* -- 29.0* --  PLT 231 191 -- 177 --  APTT -- -- -- -- 33  LABPROT 27.5* 16.9* -- -- 15.7*  INR 2.51* 1.35 -- -- 1.22  HEPARINUNFRC -- -- <0.10* <0.10* --  CREATININE 0.54 0.60 -- 0.59 --  CKTOTAL -- -- -- -- --  CKMB -- -- -- -- --  TROPONINI -- -- -- -- --   Estimated Creatinine Clearance: 51.1 ml/min (by C-G formula based on Cr of 0.54).  Medical History: Past Medical History  Diagnosis Date  . Osteoarthritis   . Degenerative joint disease   . Lumbar spinal stenosis   . Diabetes mellitus, type 2   . Hypertension   . Gout   . GERD (gastroesophageal reflux disease)   . Fundic gland polyps of stomach, benign   . Helicobacter pylori gastritis   . Shingles     current shingles- x4 yrs., on L side of face     . Hiatal hernia     colonoscopy & endoscopy- 2 wks. ago, diag /w small hiatal hernia   . Cancer     melanoma- R arm , area excised     Medications: Infusions:     . DISCONTD: sodium chloride 1,000 mL (08/25/11 0256)    Assessment: Pulmonary embolism: Lovenox d#3 at treatment dose 70 mg twice daily. Coumadin 5 mg 11/9-11/10, inr greatly increased 11/10-11/11 (2.51<---1.35), will hold coumadin dose tonight.  Goal of Therapy:  INR 2-3 Lovenox bridge, 1mg /kg/dose q 12 hrs.  No renal adjustment is required   Plan: Lovenox 70 mg  sq bid per treatment dosing day #3/5 Hold coumadin today given increase in INR F/u pt/inr 11/12 for further coumadin dosing  Jevan Gaunt, [place] R, Pharm.D. 08/27/2011 8:41 AM

## 2011-08-27 NOTE — Progress Notes (Signed)
Subjective: Complaints of dry cough,  nasal congestion  Objective: Vital signs in last 24 hours: Temp:  [98.7 F (37.1 C)-100.4 F (38 C)] 98.7 F (37.1 C) (11/11 0618) Pulse Rate:  [92-98] 92  (11/11 0618) Resp:  [16-20] 17  (11/11 0618) BP: (109-140)/(43-57) 140/57 mmHg (11/11 0618) SpO2:  [91 %-98 %] 96 % (11/11 0618) Weight change:  Last BM Date: 08/26/11  Intake/Output from previous day: 11/10 0701 - 11/11 0700 In: 540 [P.O.:540] Out: -    Physical Exam: General: Alert, awake, oriented x3, in no acute distress. HEENT: No bruits, no goiter. Heart: Regular rate and rhythm, without murmurs, rubs, gallops. Lungs: Clear to auscultation bilaterally. Abdomen: Soft, nontender, nondistended, positive bowel sounds. Extremities: No clubbing cyanosis or edema with positive pedal pulses. Neuro: Grossly intact, nonfocal.    Lab Results: Basic Metabolic Panel:  Basename 08/27/11 0500 08/26/11 0600  NA 139 139  K 3.6 3.3*  CL 104 103  CO2 27 27  GLUCOSE 107* 88  BUN 10 8  CREATININE 0.54 0.60  CALCIUM 9.2 8.9  MG -- --  PHOS -- --   Liver Function Tests: No results found for this basename: AST:2,ALT:2,ALKPHOS:2,BILITOT:2,PROT:2,ALBUMIN:2 in the last 72 hours No results found for this basename: LIPASE:2,AMYLASE:2 in the last 72 hours No results found for this basename: AMMONIA:2 in the last 72 hours CBC:  Basename 08/27/11 0500 08/26/11 0600  WBC 9.4 8.3  NEUTROABS -- --  HGB 9.3* 8.7*  HCT 27.9* 25.9*  MCV 86.9 87.5  PLT 231 191   Cardiac Enzymes: No results found for this basename: CKTOTAL:3,CKMB:3,CKMBINDEX:3,TROPONINI:3 in the last 72 hours BNP: No results found for this basename: POCBNP:3 in the last 72 hours D-Dimer: No results found for this basename: DDIMER:2 in the last 72 hours CBG:  Basename 08/27/11 1125 08/27/11 0705 08/26/11 2212 08/26/11 1611 08/26/11 1149 08/26/11 0647  GLUCAP 224* 99 207* 154* 292* 79   Hemoglobin A1C: No results found  for this basename: HGBA1C in the last 72 hours Fasting Lipid Panel: No results found for this basename: CHOL,HDL,LDLCALC,TRIG,CHOLHDL,LDLDIRECT in the last 72 hours Thyroid Function Tests: No results found for this basename: TSH,T4TOTAL,FREET4,T3FREE,THYROIDAB in the last 72 hours Anemia Panel: No results found for this basename: VITAMINB12,FOLATE,FERRITIN,TIBC,IRON,RETICCTPCT in the last 72 hours Coagulation:  Basename 08/27/11 0500 08/26/11 0600  LABPROT 27.5* 16.9*  INR 2.51* 1.35   Alcohol Level: No results found for this basename: ETH:2 in the last 72 hours  Recent Results (from the past 240 hour(s))  SURGICAL PCR SCREEN     Status: Normal   Collection Time   08/18/11  1:22 PM      Component Value Range Status Comment   MRSA, PCR NEGATIVE  NEGATIVE  Final    Staphylococcus aureus NEGATIVE  NEGATIVE  Final     Studies/Results: No results found.  Medications: Scheduled Meds:   . amLODipine  5 mg Oral Daily  . enoxaparin (LOVENOX) injection  70 mg Subcutaneous Q12H  . glimepiride  2 mg Oral BID WC  . insulin aspart  0-15 Units Subcutaneous TID WC  . metFORMIN  500 mg Oral BID  . pantoprazole  40 mg Oral Q1200  . polyethylene glycol  17 g Oral Daily  . ramipril  10 mg Oral Daily  . warfarin  5 mg Oral ONCE-1800   Continuous Infusions:  PRN Meds:.bisacodyl, bisacodyl, HYDROcodone-acetaminophen, HYDROmorphone (DILAUDID) injection, magic mouthwash, methocarbamol(ROBAXIN) IV, methocarbamol, metoCLOPramide (REGLAN) injection, metoCLOPramide, ondansetron (ZOFRAN) IV, ondansetron, polyvinyl alcohol, senna-docusate  Assessment/Plan: 1. PE (  pulmonary embolism): Postop continue Coumadin with Lovenox bridge the 3 of 5. She will need chronic Coumadin for 3-6 months since this is a postop pulmonary embolism.  2. pleuritic right lower chest and abdominal upper quadrant pain: Improved , this is due to her pulmonary embolism which is located in the right lower lung. Will continue  narcotics for pain control, expect this to improve in the next 24-48 hours.  3. dry cough with PND: try Mucinex dm 4. Spinal stenosis of lumbar region at multiple levels per Dr.Tooke  5. Diabetes mellitus: Continue metformin, glimepiride  Ambulate in the halls today, out of bed to chair.    LOS: 5 days   [name] 08/27/2011, 11:34 AM

## 2011-08-27 NOTE — Progress Notes (Addendum)
Physical Therapy Treatment Patient Details Name: [name] MRN: [id number] DOB: [birth year] Today's Date: 08/27/2011  PT Assessment/Plan  PT - Assessment/Plan Comments on Treatment Session: Pt. needing max cues during ambulation to maintain back precautions and appeared to disregard information.  Noted twisting x3 with cues each time to follow precautions. PT Plan: Discharge plan remains appropriate;Frequency remains appropriate PT Frequency: Min 5X/week Follow Up Recommendations: Home health PT Equipment Recommended: None recommended by PT PT Goals  Acute Rehab PT Goals PT Transfer Goal: Sit to Stand/Stand to Sit - Progress: Progressing toward goal PT Goal: Ambulate - Progress: Met  PT Treatment Precautions/Restrictions  Precautions Precautions: Back Precaution Booklet Issued: No Precaution Comments: Pt only able to recall no twisting; needed max cues and education to remember bending and arching Required Braces or Orthoses: Yes Spinal Brace: Thoracolumbosacral orthotic;Applied in sitting position Spinal Brace Comments: Spoke with Marisue Humble, PAC and she stated that patient can don/dof brace in sitting. Pt also ok to go to restroom and sit in chair without brace. RN made aware.  Restrictions Weight Bearing Restrictions: No Mobility (including Balance) Bed Mobility Bed Mobility: No Transfers Sit to Stand: 6: Modified independent (Device/Increase time);From bed Stand to Sit: 5: Supervision;To chair/3-in-1 Stand to Sit Details: cues to keep RW with patient at all times Ambulation/Gait Ambulation/Gait: Yes Ambulation/Gait Assistance: 6: Modified independent (Device/Increase time) Ambulation Distance (Feet): 400 Feet Assistive device: Rolling walker Gait Pattern: Within Functional Limits Stairs: No    Exercise    End of Session PT - End of Session Equipment Utilized During Treatment: Back brace;Gait belt Activity Tolerance: Patient tolerated treatment well Patient  left: Other (comment);with call bell in reach (on 3-in-1) Nurse Communication: Mobility status for ambulation;Mobility status for transfers General Behavior During Session: [facility] for tasks performed Cognition: [facility] for tasks performed  Feltis, Nicki Reaper 08/27/2011, 9:58 AM Nicki Reaper. Feltis, PT, DPT [phone]

## 2011-08-27 NOTE — Progress Notes (Signed)
Subjective: 5 Days Post-Op Procedure(s) (LRB): FUSION POSTERIOR SPINAL MULTILEVEL/SCOLIOSIS (N/A) Patient reports pain as 2 on 0-10 scale.   She continues to have right lower chest pain with deep breathing.  Likely secondary to PE. Her back feels fine, she is just having decreased appetite and feeling down due to her medical troubles.  Objective: Vital signs in last 24 hours: Temp:  [98.7 F (37.1 C)-100.4 F (38 C)] 98.7 F (37.1 C) (11/11 0618) Pulse Rate:  [92-98] 92  (11/11 0618) Resp:  [16-20] 17  (11/11 0618) BP: (109-140)/(43-57) 140/57 mmHg (11/11 0618) SpO2:  [91 %-98 %] 96 % (11/11 0618)  Intake/Output from previous day: 11/10 0701 - 11/11 0700 In: 540 [P.O.:540] Out: -  Intake/Output this shift:     Basename 08/27/11 0500 08/26/11 0600 08/25/11 0625  HGB 9.3* 8.7* 9.7*    Basename 08/27/11 0500 08/26/11 0600  WBC 9.4 8.3  RBC 3.21* 2.96*  HCT 27.9* 25.9*  PLT 231 191    Basename 08/27/11 0500 08/26/11 0600  NA 139 139  K 3.6 3.3*  CL 104 103  CO2 27 27  BUN 10 8  CREATININE 0.54 0.60  GLUCOSE 107* 88  CALCIUM 9.2 8.9    Basename 08/27/11 0500 08/26/11 0600  LABPT -- --  INR 2.51* 1.35    Neurologically intact ABD soft Sensation intact distally Dorsiflexion/Plantar flexion intact Incision: no drainage  Assessment/Plan: 5 Days Post-Op Procedure(s) (LRB): FUSION POSTERIOR SPINAL MULTILEVEL/SCOLIOSIS (N/A) Discharge home with home health For safety evaluation.  She will be d/c on coumadin for 3-6 months secondary to PE.  She will ambulate with the TLSO brace.   Abri Vacca [name] 08/27/2011, 12:39 PM

## 2011-08-28 ENCOUNTER — Encounter (HOSPITAL_COMMUNITY): Payer: Self-pay | Admitting: Orthopedic Surgery

## 2011-08-28 LAB — BASIC METABOLIC PANEL
BUN: 8 mg/dL (ref 6–23)
CO2: 27 mEq/L (ref 19–32)
Calcium: 9.2 mg/dL (ref 8.4–10.5)
Glucose, Bld: 133 mg/dL — ABNORMAL HIGH (ref 70–99)
Sodium: 139 mEq/L (ref 135–145)

## 2011-08-28 LAB — GLUCOSE, CAPILLARY
Glucose-Capillary: 134 mg/dL — ABNORMAL HIGH (ref 70–99)
Glucose-Capillary: 187 mg/dL — ABNORMAL HIGH (ref 70–99)
Glucose-Capillary: 132 mg/dL — ABNORMAL HIGH (ref 70–99)

## 2011-08-28 LAB — CBC
HCT: 28.1 % — ABNORMAL LOW (ref 36.0–46.0)
MCH: 29.2 pg (ref 26.0–34.0)
MCV: 86.5 fL (ref 78.0–100.0)
RDW: 12.9 % (ref 11.5–15.5)
WBC: 11.4 10*3/uL — ABNORMAL HIGH (ref 4.0–10.5)

## 2011-08-28 MED ORDER — POTASSIUM CHLORIDE CRYS ER 20 MEQ PO TBCR
20.0000 meq | EXTENDED_RELEASE_TABLET | Freq: Once | ORAL | Status: AC
  Administered 2011-08-28: 20 meq via ORAL
  Filled 2011-08-28: qty 1

## 2011-08-28 NOTE — Progress Notes (Signed)
ANTICOAGULATION CONSULT NOTE - Follow Up Consult  Pharmacy Consult for Coumadin and Lovenox (D#4/5 overlap)  Indication: pulmonary embolus  Allergies  Allergen Reactions  . Celebrex (Celecoxib) Swelling  . Morphine Sulfate Nausea Only  . Percocet (Oxycodone-Acetaminophen) Itching    Patient Measurements: Height: 5\' 1"  (154.9 cm) Weight: 160 lb (72.576 kg) IBW/kg (Calculated) : 47.8   Vital Signs: Temp: 99 F (37.2 C) (11/12 0530) Temp src: Oral (11/12 0530) BP: 163/67 mmHg (11/12 0530) Pulse Rate: 80  (11/12 0530)  Labs:  Basename 08/28/11 0625 08/27/11 0500 08/26/11 0600  HGB 9.5* 9.3* --  HCT 28.1* 27.9* 25.9*  PLT 275 231 191  APTT -- -- --  LABPROT 30.4* 27.5* 16.9*  INR 2.85* 2.51* 1.35  HEPARINUNFRC -- -- --  CREATININE 0.50 0.54 0.60  CKTOTAL -- -- --  CKMB -- -- --  TROPONINI -- -- --   Estimated Creatinine Clearance: 50.2 ml/min (by C-G formula based on Cr of 0.5).   Medications:  Scheduled:    . amLODipine  5 mg Oral Daily  . dextromethorphan-guaiFENesin  1 tablet Oral BID  . enoxaparin (LOVENOX) injection  70 mg Subcutaneous Q12H  . glimepiride  2 mg Oral BID WC  . insulin aspart  0-15 Units Subcutaneous TID WC  . metFORMIN  500 mg Oral BID  . pantoprazole  40 mg Oral Q1200  . polyethylene glycol  17 g Oral Daily  . ramipril  10 mg Oral Daily    Assessment: Pt is a 77 yo F on Coumadin and Lovenox 70mg  BID bridge (day 4/5) for acute PE. Patient INR continued to increase from 2.51 to 2.85 despite receiving no Coumadin yesterday. Will hold Coumadin today and reassess in am.   Goal of Therapy:  INR 2-3   Plan:  1. Continue Lovenox 70mg  SQ BID (day 4/5 overlap) 2. Hold Coumadin today 3. Followup PT/INR in am  [name] 08/28/2011,11:40 AM

## 2011-08-28 NOTE — Progress Notes (Signed)
  PATIENT ID:      [name]  MRN:     [id number] DOB/AGE:    75-Jan-1929 / 77 y.o.    PROGRESS NOTE Subjective:  negative for Chest Pain  negative for Shortness of Breath  negative for Nausea/Vomiting   negative for Calf Pain  positive for Bowel Movement   Tolerating Diet: yes Still not much of an appetite.         Patient reports pain as 3 on 0-10 scale.    Objective: Vital signs in last 24 hours:  Patient Vitals for the past 24 hrs:  BP Temp Temp src Pulse Resp SpO2  08/28/11 0530 163/67 mmHg 99 F (37.2 C) Oral 80  18  91 %  08/27/11 2032 148/57 mmHg 98.6 F (37 C) Oral 92  20  91 %  08/27/11 1400 133/43 mmHg 97.6 F (36.4 C) Oral 92  18  95 %      Intake/Output from previous day:   11/11 0701 - 11/12 0700 In: 600 [P.O.:600] Out: 1200 [Urine:1200]   Intake/Output this shift:       Intake/Output      11/11 0701 - 11/12 0700 11/12 0701 - 11/13 0700   P.O. 600    Total Intake(mL/kg) 600 (8.3)    Urine (mL/kg/hr) 1200 (0.7)    Total Output 1200    Net -600            LABORATORY DATA:   Basename 08/28/11 0625 08/27/11 0500 08/26/11 0600 08/25/11 0625 08/24/11 0559  WBC 11.4* 9.4 8.3 11.0* 10.6*  HGB 9.5* 9.3* 8.7* 9.7* 9.5*  HCT 28.1* 27.9* 25.9* 29.0* 28.3*  PLT 275 231 191 177 144*    Basename 08/28/11 0625 08/27/11 0500 08/26/11 0600 08/25/11 0625 08/24/11 0559 08/23/11 0700  NA 139 139 139 137 135 135  K 3.4* 3.6 3.3* 3.6 3.6 4.4  CL 101 104 103 102 101 100  CO2 27 27 27 22 25 24   BUN 8 10 8 9 11 8   CREATININE 0.50 0.54 0.60 0.59 0.67 0.64  GLUCOSE 133* 107* 88 176* 196* 183*  CALCIUM 9.2 9.2 8.9 8.8 8.7 8.7   Lab Results  Component Value Date   INR 2.85* 08/28/2011   INR 2.51* 08/27/2011   INR 1.35 08/26/2011    Examination:  General appearance: alert, cooperative and no distress  Wound Exam: clean, dry, intact   Drainage:  None: wound tissue dry  Motor Exam EHL, Anterior Tibial and Quadriceps Intact  Sensory Exam Deep  Peroneal and Tibial   Assessment:    6 Days Post-Op  Procedure(s) (LRB): FUSION POSTERIOR SPINAL MULTILEVEL/SCOLIOSIS (N/A)  ADDITIONAL DIAGNOSIS:  Active Problems:  Spinal stenosis of lumbar region at multiple levels  Diabetes mellitus  PE (pulmonary embolism)     Plan: Physical Therapy as ordered Weight Bearing as Tolerated (WBAT)  DVT Prophylaxis:  Lovenox, Coumadin, Foot Pumps and TED hose  DISCHARGE PLAN: Home  DISCHARGE NEEDS: HHPT and HHRN Ready to d/c home once medically stable per Hospitalitis  MD.           [name] 08/28/2011, 8:09 AM

## 2011-08-28 NOTE — Progress Notes (Signed)
Waiting for order from MD regarding Home Health to be arranged.

## 2011-08-28 NOTE — Progress Notes (Signed)
Subjective: Doing much better today, pleuritic right lower chest pain is improved  Objective: Vital signs in last 24 hours: Temp:  [97.6 F (36.4 C)-99 F (37.2 C)] 99 F (37.2 C) (11/12 0530) Pulse Rate:  [80-92] 80  (11/12 0530) Resp:  [18-20] 18  (11/12 0530) BP: (133-163)/(43-67) 163/67 mmHg (11/12 0530) SpO2:  [91 %-95 %] 91 % (11/12 0530) Weight change:  Last BM Date: 08/28/11  Intake/Output from previous day: 11/11 0701 - 11/12 0700 In: 600 [P.O.:600] Out: 1200 [Urine:1200] Total I/O In: 360 [P.O.:360] Out: -  Physical Exam: General: Alert, awake, oriented x3, in no acute distress. HEENT: No bruits, no goiter. Heart: Regular rate and rhythm, without murmurs, rubs, gallops. Lungs: Decreased breath sounds at the bases Abdomen: Soft, nontender, nondistended, positive bowel sounds. Extremities: No clubbing cyanosis or edema with positive pedal pulses. Neuro: Grossly intact, nonfocal.    Lab Results: Basic Metabolic Panel:  Basename 08/28/11 0625 08/27/11 0500  NA 139 139  K 3.4* 3.6  CL 101 104  CO2 27 27  GLUCOSE 133* 107*  BUN 8 10  CREATININE 0.50 0.54  CALCIUM 9.2 9.2  MG -- --  PHOS -- --   Liver Function Tests: No results found for this basename: AST:2,ALT:2,ALKPHOS:2,BILITOT:2,PROT:2,ALBUMIN:2 in the last 72 hours No results found for this basename: LIPASE:2,AMYLASE:2 in the last 72 hours No results found for this basename: AMMONIA:2 in the last 72 hours CBC:  Basename 08/28/11 0625 08/27/11 0500  WBC 11.4* 9.4  NEUTROABS -- --  HGB 9.5* 9.3*  HCT 28.1* 27.9*  MCV 86.5 86.9  PLT 275 231   Cardiac Enzymes: No results found for this basename: CKTOTAL:3,CKMB:3,CKMBINDEX:3,TROPONINI:3 in the last 72 hours BNP: No results found for this basename: POCBNP:3 in the last 72 hours D-Dimer: No results found for this basename: DDIMER:2 in the last 72 hours CBG:  Basename 08/28/11 1056 08/28/11 0641 08/27/11 2136 08/27/11 1649 08/27/11 1125  08/27/11 0705  GLUCAP 187* 134* 157* 139* 224* 99   Hemoglobin A1C: No results found for this basename: HGBA1C in the last 72 hours Fasting Lipid Panel: No results found for this basename: CHOL,HDL,LDLCALC,TRIG,CHOLHDL,LDLDIRECT in the last 72 hours Thyroid Function Tests: No results found for this basename: TSH,T4TOTAL,FREET4,T3FREE,THYROIDAB in the last 72 hours Anemia Panel: No results found for this basename: VITAMINB12,FOLATE,FERRITIN,TIBC,IRON,RETICCTPCT in the last 72 hours Coagulation:  Basename 08/28/11 0625 08/27/11 0500  LABPROT 30.4* 27.5*  INR 2.85* 2.51*   Alcohol Level: No results found for this basename: ETH:2 in the last 72 hours  Recent Results (from the past 240 hour(s))  SURGICAL PCR SCREEN     Status: Normal   Collection Time   08/18/11  1:22 PM      Component Value Range Status Comment   MRSA, PCR NEGATIVE  NEGATIVE  Final    Staphylococcus aureus NEGATIVE  NEGATIVE  Final     Studies/Results: No results found.  Medications: Scheduled Meds:    . amLODipine  5 mg Oral Daily  . dextromethorphan-guaiFENesin  1 tablet Oral BID  . enoxaparin (LOVENOX) injection  70 mg Subcutaneous Q12H  . glimepiride  2 mg Oral BID WC  . insulin aspart  0-15 Units Subcutaneous TID WC  . metFORMIN  500 mg Oral BID  . pantoprazole  40 mg Oral Q1200  . polyethylene glycol  17 g Oral Daily  . ramipril  10 mg Oral Daily   Continuous Infusions:  PRN Meds:.bisacodyl, bisacodyl, HYDROcodone-acetaminophen, HYDROmorphone (DILAUDID) injection, magic mouthwash, methocarbamol(ROBAXIN) IV, methocarbamol, metoCLOPramide (REGLAN)  injection, metoCLOPramide, ondansetron (ZOFRAN) IV, ondansetron, polyvinyl alcohol, senna-docusate  Assessment/Plan: 1. PE (pulmonary embolism): Postop continue Coumadin with Lovenox bridge day 4 of 5. She will need chronic Coumadin for 3-6 months since this is a postop pulmonary embolism. If she goes home tomorrow she will not need any further bridging  after a.m. dose of Lovenox which we can give it to her as a one-time dose of 1.5 mg per kilogram subcutaneous. We'll need INR checked on Wednesday per home health RN and the results should be followed by Dr. Pearson Grippe her PCP 2. pleuritic right lower chest and abdominal upper quadrant pain: Improved , this is due to her pulmonary embolism which is located in the right lower lung. Will continue narcotics for pain control, expect this to improve in the next 24-48 hours.  3. dry cough with PND: try Mucinex dm 4. Spinal stenosis of lumbar region at multiple levels per Dr.Tooke  5. Diabetes mellitus: Continue metformin, glimepiride  Ambulate in the halls today, out of bed to chair.    LOS: 6 days   Nelvin Tomb 08/28/2011, 12:57 PM

## 2011-08-28 NOTE — Progress Notes (Signed)
  PT Note:   Pt declined ambulation and reviewing of back precautions and brace wear this PM. Pt stated that she had walked this morning and was napping when I arrived. Will plan on seeing patient in AM to review before DC home.  08/28/2011 Fredrich Birks PTA [phone] pager [phone] office

## 2011-08-28 NOTE — Progress Notes (Signed)
Open to air

## 2011-08-29 LAB — GLUCOSE, CAPILLARY
Glucose-Capillary: 81 mg/dL (ref 70–99)
Glucose-Capillary: 197 mg/dL — ABNORMAL HIGH (ref 70–99)

## 2011-08-29 LAB — PROTIME-INR: Prothrombin Time: 24.8 seconds — ABNORMAL HIGH (ref 11.6–15.2)

## 2011-08-29 LAB — CBC
Platelets: 299 10*3/uL (ref 150–400)
RBC: 3.01 MIL/uL — ABNORMAL LOW (ref 3.87–5.11)
WBC: 9.7 10*3/uL (ref 4.0–10.5)

## 2011-08-29 MED ORDER — WARFARIN SODIUM 5 MG PO TABS: 5.0000 mg | ORAL_TABLET | Freq: Every day | ORAL | Status: DC

## 2011-08-29 MED ORDER — HYDROCODONE-ACETAMINOPHEN 10-325 MG PO TABS: 1.0000 | ORAL_TABLET | ORAL | Status: AC | PRN

## 2011-08-29 MED ORDER — WARFARIN VIDEO
Freq: Once | Status: AC
  Administered 2011-08-29: 1
  Filled 2011-08-29: qty 1

## 2011-08-29 MED ORDER — PATIENT'S GUIDE TO USING COUMADIN BOOK
Freq: Once | Status: AC
  Administered 2011-08-29: 1
  Filled 2011-08-29: qty 1

## 2011-08-29 MED ORDER — HYDROCODONE-ACETAMINOPHEN 10-325 MG PO TABS: 1.0000 | ORAL_TABLET | ORAL | Status: DC | PRN

## 2011-08-29 MED ORDER — METHOCARBAMOL 500 MG PO TABS: 500.0000 mg | ORAL_TABLET | Freq: Four times a day (QID) | ORAL | Status: AC | PRN

## 2011-08-29 MED ORDER — ENOXAPARIN SODIUM 30 MG/0.3ML ~~LOC~~ SOLN
30.0000 mg | Freq: Once | SUBCUTANEOUS | Status: AC
  Administered 2011-08-29: 30 mg via SUBCUTANEOUS
  Filled 2011-08-29: qty 0.3

## 2011-08-29 NOTE — Progress Notes (Signed)
Subjective: Doing much better today, pleuritic right lower chest pain is improved  Objective: Vital signs in last 24 hours: Temp:  [98.1 F (36.7 C)-98.5 F (36.9 C)] 98.1 F (36.7 C) (11/13 0559) Pulse Rate:  [70-84] 70  (11/13 0559) Resp:  [18] 18  (11/13 0559) BP: (128-139)/(55-76) 128/76 mmHg (11/13 1022) SpO2:  [93 %-95 %] 94 % (11/13 0559) Weight change:  Last BM Date: 08/28/11  Intake/Output from previous day: 11/12 0701 - 11/13 0700 In: 1800 [P.O.:1800] Out: 450 [Urine:450]   Physical Exam: General: Alert, awake, oriented x3, in no acute distress. HEENT: No bruits, no goiter. Heart: Regular rate and rhythm, without murmurs, rubs, gallops. Lungs: Decreased breath sounds at the bases Abdomen: Soft, nontender, nondistended, positive bowel sounds. Extremities: No clubbing cyanosis or edema with positive pedal pulses. Neuro: Grossly intact, nonfocal.    Lab Results: Basic Metabolic Panel:  Basename 08/28/11 0625 08/27/11 0500  NA 139 139  K 3.4* 3.6  CL 101 104  CO2 27 27  GLUCOSE 133* 107*  BUN 8 10  CREATININE 0.50 0.54  CALCIUM 9.2 9.2  MG -- --  PHOS -- --   Liver Function Tests: No results found for this basename: AST:2,ALT:2,ALKPHOS:2,BILITOT:2,PROT:2,ALBUMIN:2 in the last 72 hours No results found for this basename: LIPASE:2,AMYLASE:2 in the last 72 hours No results found for this basename: AMMONIA:2 in the last 72 hours CBC:  Basename 08/29/11 0520 08/28/11 0625  WBC 9.7 11.4*  NEUTROABS -- --  HGB 8.8* 9.5*  HCT 26.3* 28.1*  MCV 87.4 86.5  PLT 299 275   Cardiac Enzymes: No results found for this basename: CKTOTAL:3,CKMB:3,CKMBINDEX:3,TROPONINI:3 in the last 72 hours BNP: No results found for this basename: POCBNP:3 in the last 72 hours D-Dimer: No results found for this basename: DDIMER:2 in the last 72 hours CBG:  Basename 08/29/11 1131 08/29/11 0641 08/28/11 2104 08/28/11 1602 08/28/11 1056 08/28/11 0641  GLUCAP 197* 81 132* 75  187* 134*   Hemoglobin A1C: No results found for this basename: HGBA1C in the last 72 hours Fasting Lipid Panel: No results found for this basename: CHOL,HDL,LDLCALC,TRIG,CHOLHDL,LDLDIRECT in the last 72 hours Thyroid Function Tests: No results found for this basename: TSH,T4TOTAL,FREET4,T3FREE,THYROIDAB in the last 72 hours Anemia Panel: No results found for this basename: VITAMINB12,FOLATE,FERRITIN,TIBC,IRON,RETICCTPCT in the last 72 hours Coagulation:  Basename 08/29/11 0520 08/28/11 0625  LABPROT 24.8* 30.4*  INR 2.20* 2.85*   Alcohol Level: No results found for this basename: ETH:2 in the last 72 hours  No results found for this or any previous visit (from the past 240 hour(s)).  Studies/Results: No results found.  Medications: Scheduled Meds:    . amLODipine  5 mg Oral Daily  . dextromethorphan-guaiFENesin  1 tablet Oral BID  . enoxaparin (LOVENOX) injection  30 mg Subcutaneous Once  . enoxaparin (LOVENOX) injection  70 mg Subcutaneous Q12H  . glimepiride  2 mg Oral BID WC  . insulin aspart  0-15 Units Subcutaneous TID WC  . metFORMIN  500 mg Oral BID  . pantoprazole  40 mg Oral Q1200  . patient's guide to using coumadin book   Does not apply Once  . polyethylene glycol  17 g Oral Daily  . potassium chloride  20 mEq Oral Once  . ramipril  10 mg Oral Daily  . warfarin   Does not apply Once   Continuous Infusions:  PRN Meds:.bisacodyl, bisacodyl, HYDROcodone-acetaminophen, HYDROmorphone (DILAUDID) injection, magic mouthwash, methocarbamol(ROBAXIN) IV, methocarbamol, metoCLOPramide (REGLAN) injection, metoCLOPramide, ondansetron (ZOFRAN) IV, ondansetron, polyvinyl alcohol, senna-docusate  Assessment/Plan: 1. PE (pulmonary embolism): Postop continue Coumadin with Lovenox bridge day 5 of 5. She will need chronic Coumadin for 3-6 months since this is a postop pulmonary embolism. She will get her last dose of Lovenox for bridging today, not need any further doses of  Lovenox . Will need INR checked on Wednesday per home health RN and the results should be followed by Dr. Pearson Grippe her PCP, prescribe Coumadin 2.5 mg tablets, take 1 tab tonight and then based on INR 11/14. 2. pleuritic right lower chest and abdominal upper quadrant pain: Improved , this is due to her pulmonary embolism which is located in the right lower lung. Will continue narcotics for pain control, expect this to improve in the next 24-48 hours.  3. dry cough with PND: Improved, use Mucinex dm when necessary 4. Spinal stenosis of lumbar region at multiple levels per Dr.Tooke  5. Diabetes mellitus: Continue metformin, glimepiride  DC home today,     LOS: 7 days   [name] 08/29/2011, 11:40 AM

## 2011-08-29 NOTE — Progress Notes (Signed)
Physical Therapy Treatment Patient Details Name: [name] MRN: [id number] DOB: 09/13/1928 Today's Date: 08/29/2011  PT Assessment/Plan  PT - Assessment/Plan Comments on Treatment Session: Pt educated on brace wear and back precautions. Pt following better than last sessions. Pt educated on importance of maintaining precautions with mobility.  PT Plan: Discharge plan remains appropriate PT Frequency: Min 5X/week Follow Up Recommendations: Home health PT Equipment Recommended: None recommended by PT PT Goals  Acute Rehab PT Goals PT Transfer Goal: Sit to Stand/Stand to Sit - Progress: Progressing toward goal PT Goal: Ambulate - Progress: Met PT Goal: Up/Down Stairs - Progress: Met  PT Treatment Precautions/Restrictions  Precautions Precautions: Back Precaution Booklet Issued: Yes (comment) Precaution Comments: Pt independent verbalizing back precautions but requires cues with activity. Pt having to twist slightly to don brace independently Required Braces or Orthoses: Yes Spinal Brace: Thoracolumbosacral orthotic;Applied in sitting position Spinal Brace Comments: Spoke with Marisue Humble, PAC and she stated that patient can don/dof brace in sitting. Pt also ok to go to restroom and sit in chair without brace. RN made aware.  Restrictions Weight Bearing Restrictions: No Mobility (including Balance) Bed Mobility Bed Mobility: No Transfers Transfers: Yes Sit to Stand: 6: Modified independent (Device/Increase time) Stand to Sit: 6: Modified independent (Device/Increase time) Ambulation/Gait Ambulation/Gait: Yes Ambulation/Gait Assistance: 6: Modified independent (Device/Increase time) Ambulation Distance (Feet): 350 Feet Assistive device: Rolling walker Gait Pattern: Trunk flexed Stairs: Yes Stairs Assistance: 5: Supervision Stair Management Technique: Two rails Number of Stairs: 5     Exercise    End of Session PT - End of Session Equipment Utilized During Treatment:  Gait belt;Back brace Activity Tolerance: Patient tolerated treatment well Patient left: in bed;with call bell in reach General Behavior During Session: [facility] for tasks performed Cognition: [facility] for tasks performed   Pt planning on DCing home today.   Fredrich Birks 08/29/2011, 9:59 AM 08/29/2011 Fredrich Birks PTA [phone] pager [phone] office

## 2011-08-29 NOTE — Progress Notes (Signed)
Utilization review completed. Nealie Mchatton, RN, BSN. 08/29/11 

## 2011-08-29 NOTE — Progress Notes (Signed)
08/29/11 1100 Vance Peper, RN BSN Case Manager Spoke with Patient and offered choice. Patient asked that I request a NON-SMOKING therapist and nurse. Entered that request in [facility]. [facility] will provide home health. Patient's PT/INR will be managed by her PCP: Dr. Pearson Grippe [phone].

## 2011-08-29 NOTE — Progress Notes (Signed)
ANTICOAGULATION CONSULT NOTE - Follow Up Consult  Pharmacy Consult for Couamdin/Lovenox bridge  Indication: pulmonary embolus  Allergies  Allergen Reactions  . Celebrex (Celecoxib) Swelling  . Morphine Sulfate Nausea Only  . Percocet (Oxycodone-Acetaminophen) Itching    Patient Measurements: Height: 5\' 1"  (154.9 cm) Weight: 160 lb (72.576 kg) IBW/kg (Calculated) : 47.8    Vital Signs: Temp: 98.1 F (36.7 C) (11/13 0559) BP: 128/76 mmHg (11/13 1022) Pulse Rate: 70  (11/13 0559)  Labs:  Basename 08/29/11 0520 08/28/11 0625 08/27/11 0500  HGB 8.8* 9.5* --  HCT 26.3* 28.1* 27.9*  PLT 299 275 231  APTT -- -- --  LABPROT 24.8* 30.4* 27.5*  INR 2.20* 2.85* 2.51*  HEPARINUNFRC -- -- --  CREATININE -- 0.50 0.54  CKTOTAL -- -- --  CKMB -- -- --  TROPONINI -- -- --   Estimated Creatinine Clearance: 50.2 ml/min (by C-G formula based on Cr of 0.5).    Assessment: Day #5/5 lovenox/coumadin overlap.  Therapeutic INR today =2.2, down from 2.85, coumadin doses held 11/11 and 11/12 due to significant increase in INR on 08/27/11. CBC stable, no bleeding reported. Ortho indicates pt to be discharged home today on coumadin 5mg  daily, no lovenox at discharge per Dr. Edison Simon note yesterday. Lovenox 70mg  sq given this AM. If MD wants 1.5mg /kg x1 dose this AM before discharge we could give additional 30mg  Lovenox this AM.  Goal of Therapy:  INR 2-3    Plan: Give Lovenox 30mg  sq x 1 dose now to = 1.5mg /kg x1 dose to complete 5days lovenox bridge. I will review coumadin education with patient prior to discharge.I have discussed pt with Mena Pauls, RN Case manager as MD notes INR to be monitored by [facility] and PCP , Dr. Pearson Grippe to manage coumadin dosing after discharge (per 11/12 Dr. Edison Simon progress note). Needs INR on 08/30/11.   Arman Filter 08/29/2011,11:18 AM

## 2011-08-31 ENCOUNTER — Encounter (HOSPITAL_COMMUNITY): Payer: Self-pay | Admitting: Anesthesiology

## 2011-08-31 NOTE — Discharge Summary (Signed)
NAMEMarland Kitchen  MERCEDEZ, BOULE NO.:  [id number]  MEDICAL RECORD NO.:  [id number]  LOCATION:  5013                         FACILITY:  [facility]  PHYSICIAN:  Nelda Severe, MD      DATE OF BIRTH:  1928-04-20  DATE OF ADMISSION:  08/22/2011 DATE OF DISCHARGE:  08/29/2011                              DISCHARGE SUMMARY   FINAL DIAGNOSIS:  Lumbar stenosis, degenerative disk disease, spondylosis, status post previous fusion L2-S1.  She was brought to [facility] for surgery by Dr. Nelda Severe. She was admitted on August 22, 2011, and underwent surgery, posterior lumbar fusion T10-L2 with laminectomy at L2-3 decompression.  She was admitted to [facility] 5004 postoperatively, grossly neurovascularly motor intact.  CHIEF COMPLAINT:  Right lower chest pain as well as anterior thigh pain. She was ordered a TLSO brace.  Labs were stable throughout her stay.  On August 23, 2011, she complained of shortness of breath, decreased saturations with mobility.  She required oxygen 2 L nasal cannula for saturation.  We did a medical consult, Dr. Zannie Cove.  She reviewed a chest x-ray that was ordered with comparison to August 18, 2011, preop, possible atelectasis.  In view of her needing O2 to keep her oxygen saturation levels to a normal level, she ordered a CT of the chest, which did show a pulmonary embolism.  She was started on heparin as well as Lovenox and weaned over to Coumadin with an INR goal of 2.5- 3.5.  She continued with physical therapy, wearing her TLSO brace.  Her INR became therapeutic.  Her chest pain was at ease.  No nasal cannula oxygen was required once she was stabilized.  The incisions are clean, dry, and intact.  No active drainage.  She is able to don and doff the brace with some assistance.  She has good pain control with p.o. pain medication of hydrocodone 10/325 one to two q.4 as well as Robaxin 500 one to two q.6 p.r.n. for muscle  spasms.  She is taking p.o.'s well.  DISPOSITION:  At this point is stable, and her INR is in a stable level at 2.51.  She was discharged home on August 28, 2011.  She will maintain the Coumadin regimen for 3-6 months secondary to the PE.  Her discharge Coumadin dose is 5 mg p.o. daily approximately 6 p.m. with INR blood level drawn twice a week through [facility], as well as we are ordering PT and OT eval per Home Health.  She will wear a TLSO brace when ambulating.  She may shower.  We want her to walk for exercise.  No bending, stooping, or lifting.  We will order to follow up in our office in approximately 4 weeks from surgery.  No dressings are required.  Diet is regular, and I asked her to touch base with her primary care physician to let him know that she had a PE postoperatively, and we will have her on Coumadin.  We would like to get him to take over the Coumadin regimen for the extended period of time  postoperatively 3-6 months.     Lianne Cure, P.A.   ______________________________ Nelda Severe, MD  MC/MEDQ  D:  08/31/2011  T:  08/31/2011  Job:  [id number]

## 2011-08-31 NOTE — Progress Notes (Signed)
  D/C summary #[id number]

## 2011-09-01 NOTE — Anesthesia Preprocedure Evaluation (Deleted)
Anesthesia Evaluation  Patient identified by MRN, date of birth, ID band Patient unresponsive    Reviewed: Allergy & Precautions  Airway       Dental   Pulmonary          Cardiovascular hypertension,     Neuro/Psych    GI/Hepatic   Endo/Other    Renal/GU      Musculoskeletal   Abdominal   Peds  Hematology   Anesthesia Other Findings   Reproductive/Obstetrics                           Anesthesia Physical Anesthesia Plan  ASA: III and Emergent  Anesthesia Plan: General   Post-op Pain Management:    Induction:   Airway Management Planned: Oral ETT  Additional Equipment:   Intra-op Plan:   Post-operative Plan: Post-operative intubation/ventilation  Informed Consent: I have reviewed the patients History and Physical, chart, labs and discussed the procedure including the risks, benefits and alternatives for the proposed anesthesia with the patient or authorized representative who has indicated his/her understanding and acceptance.   Dental advisory given  Plan Discussed with: CRNA  Anesthesia Plan Comments:         Anesthesia Quick Evaluation

## 2011-09-29 ENCOUNTER — Other Ambulatory Visit: Payer: Self-pay | Admitting: Gastroenterology

## 2011-09-29 MED ORDER — OMEPRAZOLE 20 MG PO CPDR: 20.0000 mg | DELAYED_RELEASE_CAPSULE | Freq: Every day | ORAL | Status: DC

## 2011-09-29 NOTE — Telephone Encounter (Signed)
Prescription send to [facility] for 90 day supply.

## 2011-10-03 NOTE — Addendum Note (Signed)
Addendum  created 10/03/11 0133 by Judie Petit, MD   Modules edited:Notes Section

## 2011-10-03 NOTE — Anesthesia Postprocedure Evaluation (Signed)
  Anesthesia Post-op Note  Patient: [name]  Procedure(s) Performed:  FUSION POSTERIOR SPINAL MULTILEVEL/SCOLIOSIS - LUMBAR LAMINECTOMY, EXTENSION OF POSTERIOR LUMBAR FUSION TO T10 WITH K2M RODS, SCREWS, CONNECTORS, HOOKS, ILIAC CREST BONE GRAFT  Patient Location: PACU  Anesthesia Type: General  Level of Consciousness: awake  Airway and Oxygen Therapy: Patient Spontanous Breathing  Post-op Pain: mild  Post-op Assessment: Post-op Vital signs reviewed  Post-op Vital Signs: stable  Complications: No apparent anesthesia complications

## 2011-10-13 ENCOUNTER — Telehealth: Payer: Self-pay | Admitting: Gastroenterology

## 2011-10-16 ENCOUNTER — Telehealth: Payer: Self-pay | Admitting: *Deleted

## 2011-10-16 MED ORDER — ONDANSETRON HCL 4 MG PO TABS: ORAL_TABLET | ORAL | Status: DC

## 2011-10-16 NOTE — Telephone Encounter (Signed)
Sheryn Bison, MD 10/16/2011 12:29 PM Signed  Prn Zofran 4mg  Graciella Freer, RN 10/16/2011 11:30 AM Signed  Pt saw Dr Russella Dar 07/25/11, previously seen by Dr Virginia Rochester. Hx of Dysphagia with Dilation,Reflux, life long constipation, normal Colon in 2003. Pt was to take Omeprazole daily and titrate Miralax 1-3 times daily. COLON on 08/02/11 with benign Tubular Adenomas ; EGD with dialtion and HH.  Pt had spinal surgery in November and developed a Blood Clot requiring prolong hospitalization.  Pt reports since hospitalization, she's been bother with Nausea, Constipation and or urgency with incontinence when she does go.  She has not been doing Omeparzole daily and Miralax as directed in OV, so she will try these.  canshe   Notified pt we ordered something for nausea and I mailed instructions and will have the pharmacist give her instructions on taking her Omeprazole an Miralax. She has a f/u with Dr Russella Dar on 10/24/10; pt stated understanding.

## 2011-10-16 NOTE — Telephone Encounter (Signed)
Prn Zofran 4mg 

## 2011-10-16 NOTE — Telephone Encounter (Signed)
Pt saw Dr Russella Dar 07/25/11, previously seen by Dr Virginia Rochester. Hx of Dysphagia with Dilation,Reflux, life long constipation, normal Colon in 2003. Pt was to take Omeprazole daily and titrate Miralax 1-3 times daily. COLON on 08/02/11 with benign Tubular Adenomas ; EGD with dialtion and HH. Pt had spinal surgery in November and developed a Blood Clot requiring prolong hospitalization. Pt reports since hospitalization, she's been bother with Nausea, Constipation and or urgency with incontinence when she does go.  She has not been doing Omeparzole daily and Miralax as directed in OV, so she will try these. canshe have something for nausea? Thanks.

## 2011-10-18 ENCOUNTER — Telehealth: Payer: Self-pay | Admitting: Gastroenterology

## 2011-10-18 NOTE — Telephone Encounter (Signed)
OK to send C diff PCR (not culture) She had lumbar surgery in 08/2011 and should notify her surgeon about incontinence

## 2011-10-18 NOTE — Telephone Encounter (Signed)
Spoke with [name] to inform her I wrote instructions for her with the Zofran script. The Zofran requires a Prior Auth and [facility] gave her 6 tabs, which [name] stated is helping. I covered her drug administration for Omeprazole and Miralax. [name] still c/o the urgency and incontinence of stool. She admits to 3 different antibiotics after the spinal surgery and a recent one for UTI.  Dr Russella Dar, do you suspect [name] may have CDIFF? [name] already has an appt with you on 10/25/11, so we could do cultures this week. Thanks.

## 2011-10-18 NOTE — Telephone Encounter (Signed)
Spoke with pt at length about her incontinence and how adding fiber in her diet may help her stools be more formed, etc. When I called her back about ordering the CDIFF she stated she thought about what I said. Since her surgery and at home, she's had a very poor appetite and her diet has changed. She used to cook for herself and eat better and she wants to try that before doing a cx. She even ate oatmeal at lunch. She will try this and decide on whether to keep her appt next week. Also, spoke to pt about adding a probiotic to her daily routine; pt stated understanding.

## 2011-10-19 ENCOUNTER — Telehealth: Payer: Self-pay | Admitting: Gastroenterology

## 2011-10-19 NOTE — Telephone Encounter (Signed)
Notified pt she can pick up her Zofran, the script went thru; pt stated understanding.

## 2011-10-25 ENCOUNTER — Encounter: Payer: Self-pay | Admitting: Gastroenterology

## 2011-10-25 ENCOUNTER — Ambulatory Visit (INDEPENDENT_AMBULATORY_CARE_PROVIDER_SITE_OTHER): Payer: Medicare Other | Admitting: Gastroenterology

## 2011-10-25 VITALS — BP 144/78 | HR 82 | Ht 61.0 in | Wt 150.4 lb

## 2011-10-25 DIAGNOSIS — Z86010 Personal history of colonic polyps: Secondary | ICD-10-CM

## 2011-10-25 DIAGNOSIS — R109 Unspecified abdominal pain: Secondary | ICD-10-CM

## 2011-10-25 DIAGNOSIS — K5900 Constipation, unspecified: Secondary | ICD-10-CM

## 2011-10-25 MED ORDER — POLYETHYLENE GLYCOL 3350 17 G PO PACK: PACK | ORAL | Status: DC

## 2011-10-25 NOTE — Patient Instructions (Signed)
Increase your Miralax mixing 17 grams in 8 oz of water 1-2 x daily for controlling constipation. Follow up with your Primary Care Physician and with [place] as needed. cc: Pearson Grippe, MD

## 2011-10-25 NOTE — Progress Notes (Signed)
History of Present Illness: This is an 78 year female who underwent spinal fusion surgery in November complicated by pulmonary embolism, urinary tract infection and constipation. She returned home from the hospital she has several episodes of diarrhea with incontinence while taking laxatives. She has had a mild mid abdominal discomfort that fluctuates with her constipation and anxiety. She has been under significant amount of stress recovering from her surgery. She has had no episodes of incontinence for the past 2 weeks and her constipation has improved. She did not follow our recommendation to increase her MiraLax to twice daily. She remains on omeprazole daily. She underwent an upper enteroscopy and colonoscopy in October 2012. Denies weight loss, diarrhea, change in stool caliber, melena, hematochezia, nausea, vomiting, dysphagia, reflux symptoms, chest pain.  Current Medications, Allergies, Past Medical History, Past Surgical History, Family History and Social History were reviewed in [employer] record.   Physical Exam: General: Well developed , well nourished, no acute distress Head: Normocephalic and atraumatic Eyes:  sclerae anicteric, EOMI Ears: Normal auditory acuity Mouth: No deformity or lesions Lungs: Clear throughout to auscultation Heart: Regular rate and rhythm; no murmurs, rubs or bruits Abdomen: Soft, non tender and non distended. No masses, hepatosplenomegaly or hernias noted. Normal Bowel sounds Musculoskeletal: Symmetrical with no gross deformities  Pulses:  Normal pulses noted Extremities: No clubbing, cyanosis, edema or deformities noted Neurological: Alert oriented x 4, grossly nonfocal Psychological:  Alert and cooperative. Anxious. Some difficulty providing a detailed history.  Assessment and Recommendations:  1. Mild mid abdominal pain and constipation following recent spinal fusion surgery which has improved substantially over the past few  weeks. Episodes of incontinence a few weeks ago were associated with high doses of laxatives and this has not recurred. She is again advised to increase her MiraLax to twice daily when she is more constipated and to use MiraLax at least on a daily basis to manage her chronic constipation. Consider an antispasmotic if her abdominal pain does not resolve with better control of her constipation. She recently underwent upper endoscopy and colonoscopy and no further evaluation is planned at this time. Reassurance provided. Ongoing followup with Dr. Selena Batten.  2. Anxiety. She has significant anxiety related to her health at this time. Further followup with Dr. Selena Batten.  3. Personal history of adenomatous colon polyps. No future surveillance colonoscopies or plan to her age.  4. GERD and esophageal dysmotility. Continue standard antireflux measures and omeprazole 20 mg every morning.

## 2011-11-23 ENCOUNTER — Telehealth: Payer: Self-pay | Admitting: Gastroenterology

## 2011-11-23 DIAGNOSIS — R197 Diarrhea, unspecified: Secondary | ICD-10-CM

## 2011-11-23 NOTE — Telephone Encounter (Signed)
Left message for patient to call back  

## 2011-11-24 NOTE — Telephone Encounter (Signed)
Patient is having some alternating bowel habits. She states she has days of constipation followed by a large formed stool then liquid stool.   She is also having some urgency and incontinence (she recently had spinal surgery in November).  She is having trouble with her new back brace and unable to get it off in time and has had some accidents.  She wants to be tested for c-diff.  She had a friend with c-diff and is convinced she has this.  Per a telephone call in Dec you had said it was ok to test for c-diff by PCR, since that phone call she was seen in the office and her symptoms were almost resolved.  I explained to her her symptoms don't sound like c-diff but if she wanted she can come give a sample.  I have suggested miralax, a high fiber diet, and Align.  She is insistent that she wants to  Be checked for c-diff.  She will come next week.

## 2011-11-26 NOTE — Telephone Encounter (Signed)
OK to check for C diff and follow the advice you gave.

## 2011-11-28 ENCOUNTER — Other Ambulatory Visit: Payer: Medicare Other

## 2011-11-28 DIAGNOSIS — R197 Diarrhea, unspecified: Secondary | ICD-10-CM

## 2011-11-29 LAB — CLOSTRIDIUM DIFFICILE BY PCR: Toxigenic C. Difficile by PCR: NOT DETECTED

## 2012-06-12 ENCOUNTER — Encounter: Payer: Self-pay | Admitting: Gastroenterology

## 2012-06-12 ENCOUNTER — Ambulatory Visit (INDEPENDENT_AMBULATORY_CARE_PROVIDER_SITE_OTHER): Payer: Medicare Other | Admitting: Gastroenterology

## 2012-06-12 VITALS — BP 140/78 | HR 88 | Ht 61.0 in | Wt 158.0 lb

## 2012-06-12 DIAGNOSIS — R198 Other specified symptoms and signs involving the digestive system and abdomen: Secondary | ICD-10-CM

## 2012-06-12 DIAGNOSIS — K589 Irritable bowel syndrome without diarrhea: Secondary | ICD-10-CM

## 2012-06-12 MED ORDER — METRONIDAZOLE 500 MG PO TABS: 500.0000 mg | ORAL_TABLET | Freq: Two times a day (BID) | ORAL | Status: AC

## 2012-06-12 MED ORDER — GLYCOPYRROLATE 1 MG PO TABS: 1.0000 mg | ORAL_TABLET | Freq: Two times a day (BID) | ORAL | Status: DC

## 2012-06-12 NOTE — Patient Instructions (Addendum)
We have sent the following medications to your pharmacy for you to pick up at your convenience: Robinul and Flagyl.  cc: Pearson Grippe, MD

## 2012-06-12 NOTE — Progress Notes (Addendum)
History of Present Illness: This is an 78 year old female with a change in bowel habits and intermittent mild abdominal pain. Reviewed office note from January 2013. At this time she is having alternating bowel movements with occasional constipation and occasional urgent loose stools. Her urgent loose stools are sometimes associated with a gnawing crampy mid abdominal pain that resolves following a bowel movement. She underwent colonoscopy and EGD in 07/2011 with findings of small adenomatous colon polyps and a small hiatal hernia.   Current Medications, Allergies, Past Medical History, Past Surgical History, Family History and Social History were reviewed in [employer] record.  Physical Exam: General: Well developed , well nourished, no acute distress Head: Normocephalic and atraumatic Eyes:  sclerae anicteric, EOMI Ears: Normal auditory acuity Mouth: No deformity or lesions Lungs: Clear throughout to auscultation Heart: Regular rate and rhythm; no murmurs, rubs or bruits Abdomen: Soft, non tender and non distended. No masses, hepatosplenomegaly or hernias noted. Normal Bowel sounds Musculoskeletal: Symmetrical with no gross deformities  Pulses:  Normal pulses noted Extremities: No clubbing, cyanosis, edema or deformities noted Neurological: Alert oriented x 4, grossly nonfocal Psychological:  Alert and cooperative. Anxious  Assessment and Recommendations:  1. Presumed irritable bowel syndrome with mid abdominal pain associated with variable bowel habits. She is advised to use MiraLax when she is constipated. Begin glycopyrrolate 1 mg twice a day. She underwent upper endoscopy and colonoscopy in October 2012 and no further evaluation is planned at this time. Trial of metronidazole for possible bacterial overgrowth or giardiasis. I encouraged her to look for dietary stressors leading to looser stools. Reassurance provided as she is very anxious about her symptoms.    2.  Anxiety. She has significant anxiety related to her health. Further followup with Dr. Selena Batten.   3. Personal history of adenomatous colon polyps. No future surveillance colonoscopies are planned to her age.   4. GERD and esophageal dysmotility. Continue standard antireflux measures and omeprazole 20 mg every morning.

## 2012-06-18 ENCOUNTER — Other Ambulatory Visit (HOSPITAL_COMMUNITY): Payer: Self-pay | Admitting: Internal Medicine

## 2012-06-18 DIAGNOSIS — Z1231 Encounter for screening mammogram for malignant neoplasm of breast: Secondary | ICD-10-CM

## 2012-08-01 ENCOUNTER — Ambulatory Visit (HOSPITAL_COMMUNITY): Payer: Medicare Other

## 2012-08-01 ENCOUNTER — Ambulatory Visit (HOSPITAL_COMMUNITY)
Admission: RE | Admit: 2012-08-01 | Discharge: 2012-08-01 | Disposition: A | Discharge status: Home / Self Care | Payer: Medicare Other | Source: Ambulatory Visit | Attending: Internal Medicine | Admitting: Internal Medicine

## 2012-08-01 DIAGNOSIS — Z1231 Encounter for screening mammogram for malignant neoplasm of breast: Secondary | ICD-10-CM

## 2012-09-02 ENCOUNTER — Other Ambulatory Visit: Payer: Self-pay

## 2012-09-02 MED ORDER — OMEPRAZOLE 20 MG PO CPDR: 20.0000 mg | DELAYED_RELEASE_CAPSULE | Freq: Every day | ORAL | Status: DC

## 2012-09-06 ENCOUNTER — Other Ambulatory Visit: Payer: Self-pay

## 2012-09-06 MED ORDER — OMEPRAZOLE 20 MG PO CPDR: 20.0000 mg | DELAYED_RELEASE_CAPSULE | Freq: Every day | ORAL | Status: DC

## 2013-02-18 ENCOUNTER — Other Ambulatory Visit: Payer: Self-pay

## 2013-02-18 NOTE — Telephone Encounter (Signed)
Pharmacy faxed request for new Rx for Clobetasole Cream.  I called them back to let them know Dr. Glenetta Hew has not seen her since 2011 and cannot issue new Rx. I spoke with Aris Georgia and she is going to call the patient to let her know she needs office visit and see if she can schedule appt for her. Pharmacy informed.

## 2013-02-24 ENCOUNTER — Encounter (HOSPITAL_COMMUNITY): Payer: Self-pay | Admitting: Emergency Medicine

## 2013-02-24 ENCOUNTER — Emergency Department (HOSPITAL_COMMUNITY)
Admission: EM | Admit: 2013-02-24 | Discharge: 2013-02-24 | Disposition: A | Discharge status: Home / Self Care | Payer: Medicare Other | Attending: Emergency Medicine | Admitting: Emergency Medicine

## 2013-02-24 ENCOUNTER — Emergency Department (HOSPITAL_COMMUNITY): Payer: Medicare Other

## 2013-02-24 DIAGNOSIS — S022XXA Fracture of nasal bones, initial encounter for closed fracture: Secondary | ICD-10-CM

## 2013-02-24 DIAGNOSIS — S60221A Contusion of right hand, initial encounter: Secondary | ICD-10-CM

## 2013-02-24 DIAGNOSIS — Z8739 Personal history of other diseases of the musculoskeletal system and connective tissue: Secondary | ICD-10-CM | POA: Insufficient documentation

## 2013-02-24 DIAGNOSIS — Z8639 Personal history of other endocrine, nutritional and metabolic disease: Secondary | ICD-10-CM | POA: Insufficient documentation

## 2013-02-24 DIAGNOSIS — Z79899 Other long term (current) drug therapy: Secondary | ICD-10-CM | POA: Insufficient documentation

## 2013-02-24 DIAGNOSIS — Z7982 Long term (current) use of aspirin: Secondary | ICD-10-CM | POA: Insufficient documentation

## 2013-02-24 DIAGNOSIS — K219 Gastro-esophageal reflux disease without esophagitis: Secondary | ICD-10-CM | POA: Insufficient documentation

## 2013-02-24 DIAGNOSIS — W19XXXA Unspecified fall, initial encounter: Secondary | ICD-10-CM

## 2013-02-24 DIAGNOSIS — Z8719 Personal history of other diseases of the digestive system: Secondary | ICD-10-CM | POA: Insufficient documentation

## 2013-02-24 DIAGNOSIS — Z8619 Personal history of other infectious and parasitic diseases: Secondary | ICD-10-CM | POA: Insufficient documentation

## 2013-02-24 DIAGNOSIS — Y9389 Activity, other specified: Secondary | ICD-10-CM | POA: Insufficient documentation

## 2013-02-24 DIAGNOSIS — Y9289 Other specified places as the place of occurrence of the external cause: Secondary | ICD-10-CM | POA: Insufficient documentation

## 2013-02-24 DIAGNOSIS — W108XXA Fall (on) (from) other stairs and steps, initial encounter: Secondary | ICD-10-CM | POA: Insufficient documentation

## 2013-02-24 DIAGNOSIS — Z862 Personal history of diseases of the blood and blood-forming organs and certain disorders involving the immune mechanism: Secondary | ICD-10-CM | POA: Insufficient documentation

## 2013-02-24 DIAGNOSIS — S60229A Contusion of unspecified hand, initial encounter: Secondary | ICD-10-CM | POA: Insufficient documentation

## 2013-02-24 DIAGNOSIS — E119 Type 2 diabetes mellitus without complications: Secondary | ICD-10-CM | POA: Insufficient documentation

## 2013-02-24 DIAGNOSIS — I10 Essential (primary) hypertension: Secondary | ICD-10-CM | POA: Insufficient documentation

## 2013-02-24 NOTE — ED Provider Notes (Signed)
History  This chart was scribed for [employer]. Rubin Payor, MD by Ardeen Jourdain, ED Scribe. This patient was seen in room TR08C/TR08C and the patient's care was started at 1949.  CSN: [id number]  Arrival date & time 02/24/13  1847   First MD Initiated Contact with Patient 02/24/13 1949      Chief Complaint  Patient presents with  . Fall     The history is provided by the patient. No language interpreter was used.    HPI Comments: [name] is a 79 y.o. female who presents to the Emergency Department complaining of a fall that occurred earlier today. Pt states she fell down the steps earlier today. She states she hit her head but denies any LOC. Pt has a skin tear to her right arm, right hand swelling, swelling to the forehead and swelling to the nose. She denies any other symptoms or injuries. No chest pain. No abdominal pain. No confusion. No difficulty breathing. She is not on blood thinners.  Past Medical History  Diagnosis Date  . Osteoarthritis   . Degenerative joint disease   . Lumbar spinal stenosis   . Diabetes mellitus, type 2   . Hypertension   . Gout   . GERD (gastroesophageal reflux disease)   . Fundic gland polyps of stomach, benign   . Helicobacter pylori gastritis   . Shingles     current shingles- x4 yrs., on L side of face     . Cancer     melanoma- R arm , area excised   . Tubular adenoma of colon 07/2011  . Hiatal hernia     Past Surgical History  Procedure Laterality Date  . Total knee arthroplasty      left  . Lumbar laminectomy      6 prior back surgery   . Cholecystectomy  1998  . Vaginal hysterectomy  1990's  . Cataract extraction      bilateral, /w IOL  . Retinal laser procedure      bilateral  . Rotator cuff repair      bilateral  . Carpal tunnel release      bilateral  . Hemorrhoid surgery      x 2  . Tonsillectomy    . Benign br. biopsy- many yrs. ago    . Spinal fusion  08/22/2011    Procedure: FUSION POSTERIOR SPINAL  MULTILEVEL/SCOLIOSIS;  Surgeon: Charlsie Quest;  Location: [facility] OR;  Service: Orthopedics;  Laterality: N/A;  LUMBAR LAMINECTOMY, EXTENSION OF POSTERIOR LUMBAR FUSION TO T10 WITH K2M RODS, SCREWS, CONNECTORS, HOOKS, ILIAC CREST BONE GRAFT    Family History  Problem Relation Age of Onset  . Colon cancer Neg Hx   . Anesthesia problems Neg Hx   . Hypotension Neg Hx   . Malignant hyperthermia Neg Hx   . Pseudochol deficiency Neg Hx   . Diabetes Mother   . Cancer Mother     mother  . Diabetes Paternal Aunt     x 1  . Diabetes Paternal Uncle     x 3  . Diabetes Brother   . Diabetes Other     neice  . Heart disease Father   . Cancer Father     gallbladder  . Liver cancer Father   . Heart disease Brother   . Colon polyps Sister     History  Substance Use Topics  . Smoking status: Never Smoker   . Smokeless tobacco: Never Used  . Alcohol Use: No  No Ob history available.   Review of Systems  Constitutional: Negative for fever and chills.  HENT:       Facial swelling  Respiratory: Negative for shortness of breath.   Gastrointestinal: Negative for nausea and vomiting.  Skin: Positive for wound.  Neurological: Negative for dizziness, syncope, weakness and headaches.  All other systems reviewed and are negative.    Allergies  Celebrex; Morphine sulfate; and Percocet  Home Medications   Current Outpatient Rx  Name  Route  Sig  Dispense  Refill  . amLODipine (NORVASC) 5 MG tablet   Oral   Take 5 mg by mouth daily.           [name] aspirin EC 81 MG tablet   Oral   Take 81 mg by mouth daily.         [name] glimepiride (AMARYL) 4 MG tablet   Oral   Take 4 mg by mouth 2 (two) times daily.         [name] HYDROcodone-acetaminophen (NORCO/VICODIN) 5-325 MG per tablet   Oral   Take 1 tablet by mouth every 6 (six) hours as needed for pain.         . metFORMIN (GLUCOPHAGE) 500 MG tablet   Oral   Take 500 mg by mouth 3 (three) times daily.          [name] omeprazole (PRILOSEC)  20 MG capsule   Oral   Take 20 mg by mouth daily.         Bertram Gala Glycol-Propyl Glycol (SYSTANE OP)   Ophthalmic   Apply 1 drop to eye daily as needed. For dry eyes         . predniSONE (DELTASONE) 5 MG tablet   Oral   Take 5-30 mg by mouth daily. pred 6 day pak. Takes 6 tablets day 1, then tapers down 1 tablet per day         . ramipril (ALTACE) 10 MG capsule   Oral   Take 10 mg by mouth daily.             Triage Vitals: BP 154/70  Pulse 90  Temp(Src) 98.4 F (36.9 C) (Oral)  Resp 16  SpO2 97%  Physical Exam  Nursing note and vitals reviewed. Constitutional: She is oriented to person, place, and time. She appears well-developed and well-nourished. No distress.  HENT:  Head: Normocephalic and atraumatic.  Mild edema and swelling over right periorbital area and over bridge of nose. Dried blood inside left nares.   Eyes: EOM are normal. Pupils are equal, round, and reactive to light.  Neck: Normal range of motion. Neck supple. No tracheal deviation present.  C-spine non-tender, ROM normal   Cardiovascular: Normal rate.   Pulmonary/Chest: Effort normal. No respiratory distress.  Abdominal: Soft. She exhibits no distension.  Musculoskeletal: Normal range of motion. She exhibits no edema.  Upper extremities non tender  Neurological: She is alert and oriented to person, place, and time.  Skin: Skin is warm and dry.  Skin tear to right fore arm over the ulna without tenderness under area.   Psychiatric: She has a normal mood and affect. Her behavior is normal.    ED Course  Procedures (including critical care time)  7:57 PM-Discussed treatment plan which includes x-ray of the hand and CT of the headwith pt at bedside and pt agreed to plan.    Labs Reviewed - No data to display Ct Head Wo Contrast  02/24/2013  *RADIOLOGY REPORT*  Clinical  Data:  Fall.  Hit head.  CT HEAD WITHOUT CONTRAST CT MAXILLOFACIAL WITHOUT CONTRAST  Technique:  Multidetector CT imaging  of the head and maxillofacial structures were performed using the standard protocol without intravenous contrast. Multiplanar CT image reconstructions of the maxillofacial structures were also generated.  Comparison:  None.  CT HEAD  Findings: There is atrophy and chronic small vessel disease changes. No acute intracranial abnormality.  Specifically, no hemorrhage, hydrocephalus, mass lesion, acute infarction, or significant intracranial injury.  No acute calvarial abnormality.  IMPRESSION: No acute intracranial abnormality.  CT MAXILLOFACIAL  Findings:   Nasal bone fracture through the left nasal bones. Nasal septum is slightly deviated to the left, likely on a chronic basis.  Zygomatic arches and orbital walls are intact.  No additional facial fracture.  Small air-fluid level in the right maxillary sinus which could represent blood or changes of acute sinusitis.  Mandible is intact.  IMPRESSION: Left nasal bone fracture.   Original Report Authenticated By: Charlett Nose, M.D.    Dg Hand Complete Right  02/24/2013  *RADIOLOGY REPORT*  Clinical Data: Fall.  Pain, bruising.  RIGHT HAND - COMPLETE 3+ VIEW  Comparison: None.  Findings: Areas of chondrocalcinosis throughout the wrist and hand. No acute bony abnormality.  Specifically, no fracture, subluxation, or dislocation.  Soft tissues are intact.  IMPRESSION: No acute bony abnormality.   Original Report Authenticated By: Charlett Nose, M.D.    Ct Maxillofacial Wo Cm  02/24/2013  *RADIOLOGY REPORT*  Clinical Data:  Fall.  Hit head.  CT HEAD WITHOUT CONTRAST CT MAXILLOFACIAL WITHOUT CONTRAST  Technique:  Multidetector CT imaging of the head and maxillofacial structures were performed using the standard protocol without intravenous contrast. Multiplanar CT image reconstructions of the maxillofacial structures were also generated.  Comparison:  None.  CT HEAD  Findings: There is atrophy and chronic small vessel disease changes. No acute intracranial abnormality.   Specifically, no hemorrhage, hydrocephalus, mass lesion, acute infarction, or significant intracranial injury.  No acute calvarial abnormality.  IMPRESSION: No acute intracranial abnormality.  CT MAXILLOFACIAL  Findings:   Nasal bone fracture through the left nasal bones. Nasal septum is slightly deviated to the left, likely on a chronic basis.  Zygomatic arches and orbital walls are intact.  No additional facial fracture.  Small air-fluid level in the right maxillary sinus which could represent blood or changes of acute sinusitis.  Mandible is intact.  IMPRESSION: Left nasal bone fracture.   Original Report Authenticated By: Charlett Nose, M.D.      1. Fall, initial encounter   2. Nasal bone fracture, closed, initial encounter   3. Hand contusion, right, initial encounter       MDM  Patient with fall. Contusion of face with nasal fracture. Negative head CT. Also contusion and hand no fracture. Will followup with ENT as needed.      I personally performed the services described in this documentation, which was scribed in my presence. The recorded information has been reviewed and is accurate.      Juliet Rude. Rubin Payor, MD 02/24/13 2119

## 2013-02-24 NOTE — ED Notes (Signed)
Pt c/o fall down steps today; pt with skin tear to right arm and right hand swelling; pt sts hit head but denies LOC; pt alert at present and PERRL

## 2013-02-27 IMAGING — RF DG ESOPHAGUS
2 series · 20 of 24 positions shown · non-contrast
Comparison: None.

CLINICAL DATA: Dysphagia.  History of esophageal dilatation 6 years
ago

ESOPHOGRAM/BARIUM SWALLOW
TECHNIQUE: Combined double contrast and single contrast
examination performed using effervescent crystals, thick barium
liquid, and thin barium liquid.
Fluoroscopy time:  0.8 minutes.

[Series 1: run · 12 of 34 slices shown (1 of 2)]
[im 1/34]
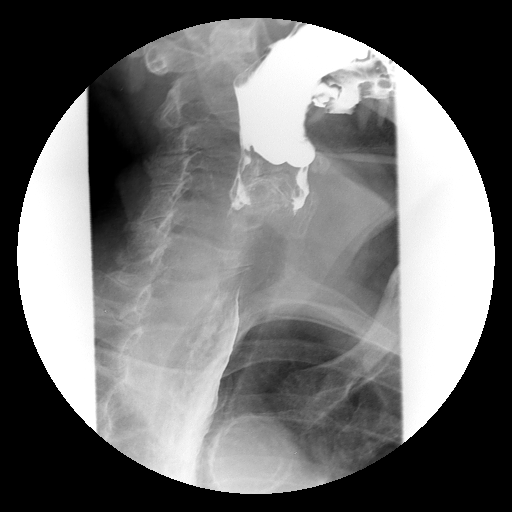
[im 3/34]
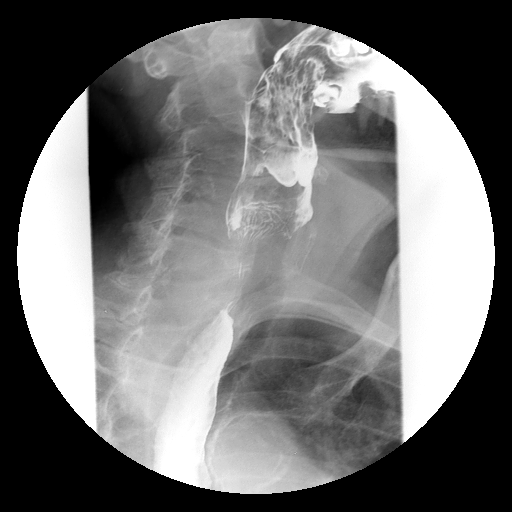
[im 8/34]
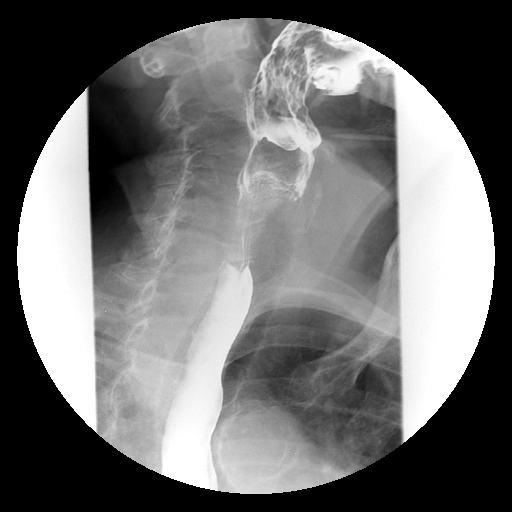
[im 11/34]
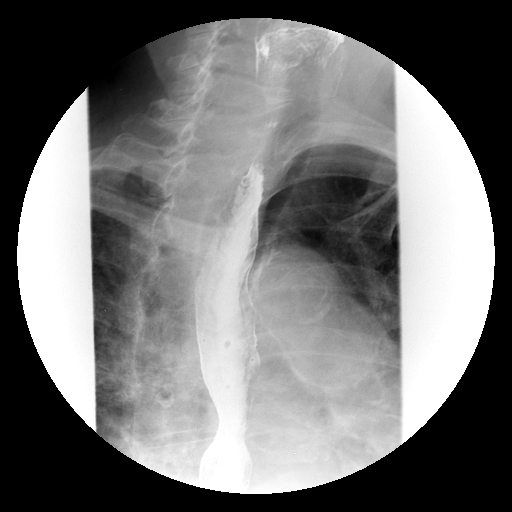
[im 13/34]
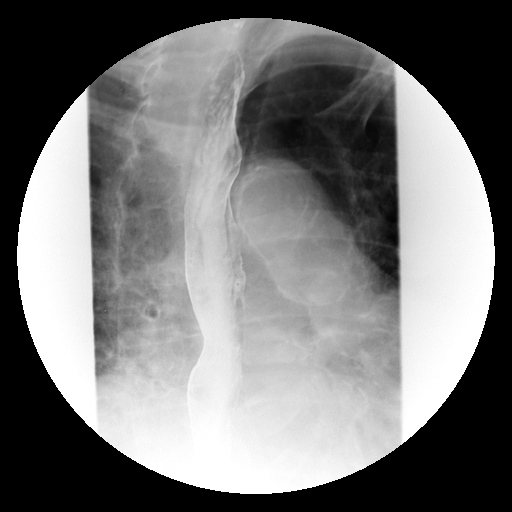
[im 16/34]
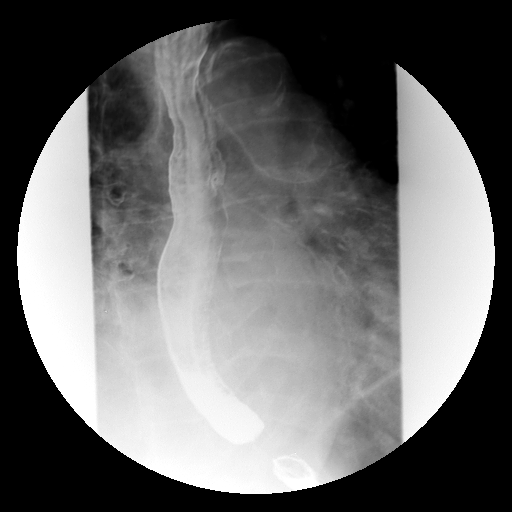
[im 18/34]
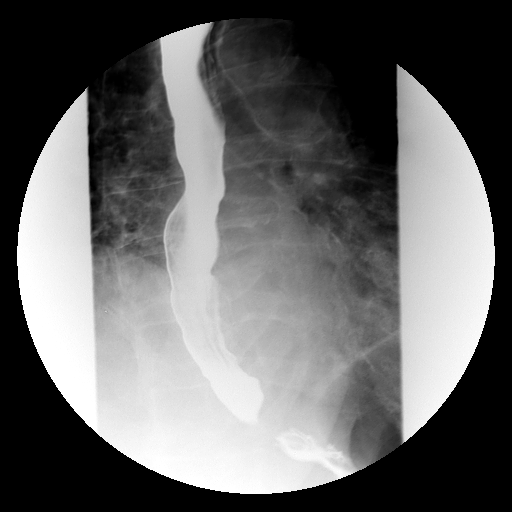
[im 23/34]
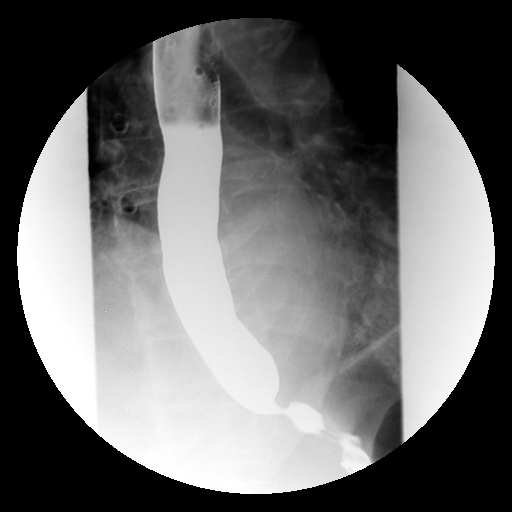
[im 26/34]
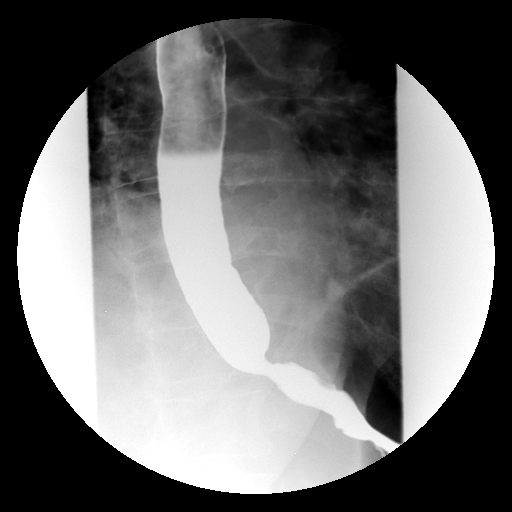
[im 28/34]
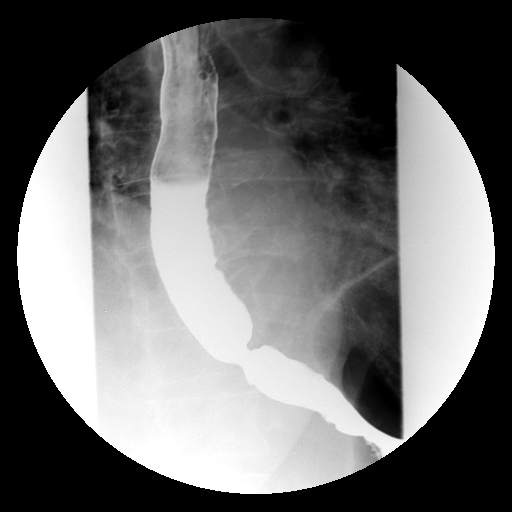
[im 31/34]
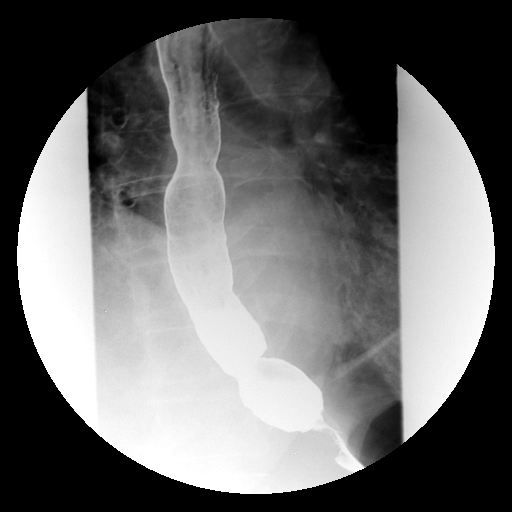
[im 34/34]
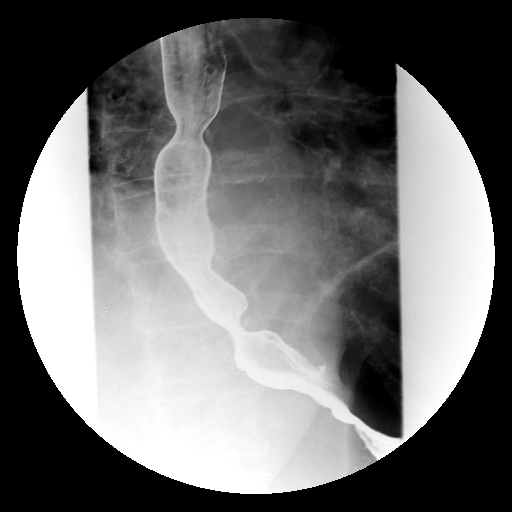

[Series 2: run · 8 of 25 slices shown (2 of 2)]
[im 3/25]
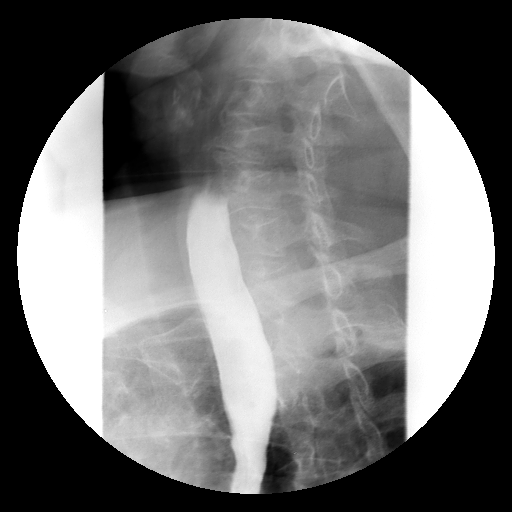
[im 6/25]
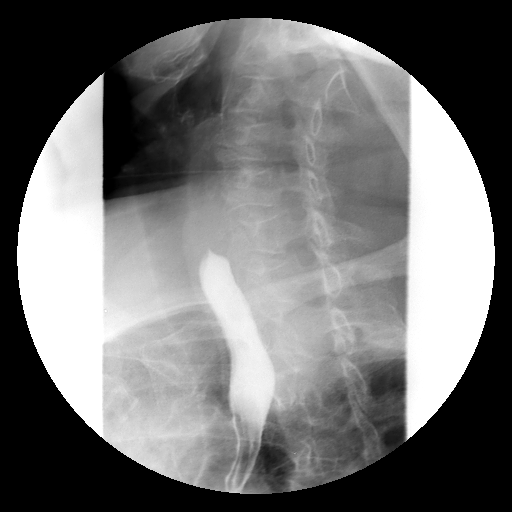
[im 9/25]
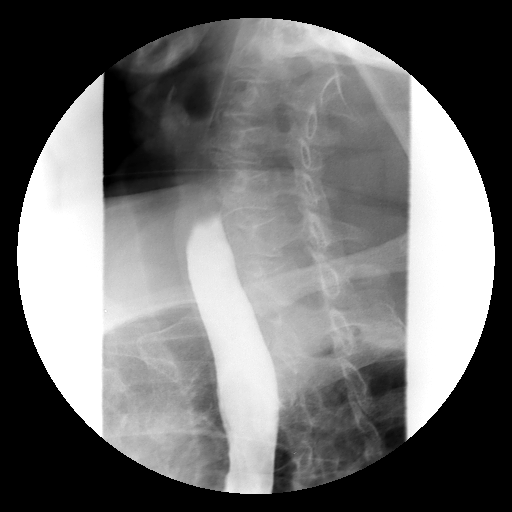
[im 11/25]
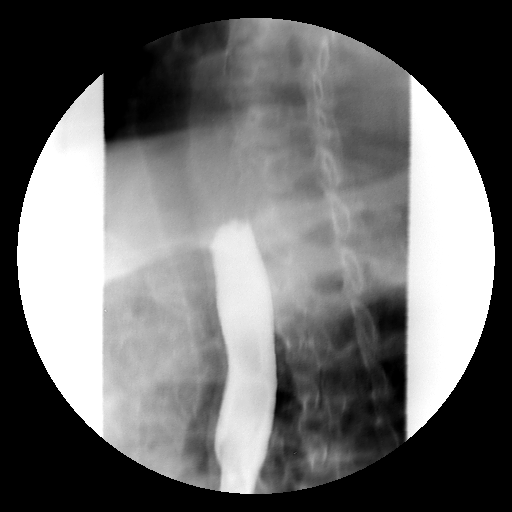
[im 14/25]
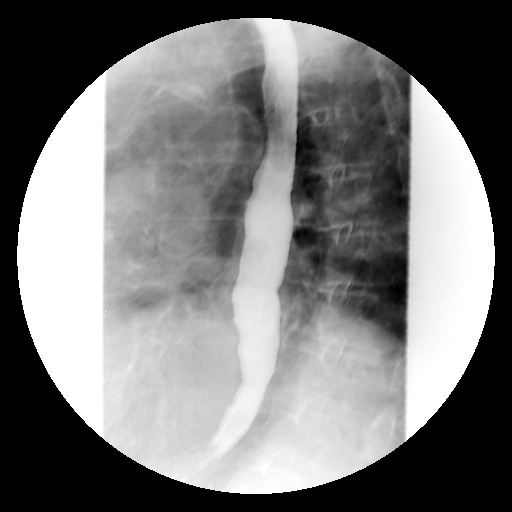
[im 19/25]
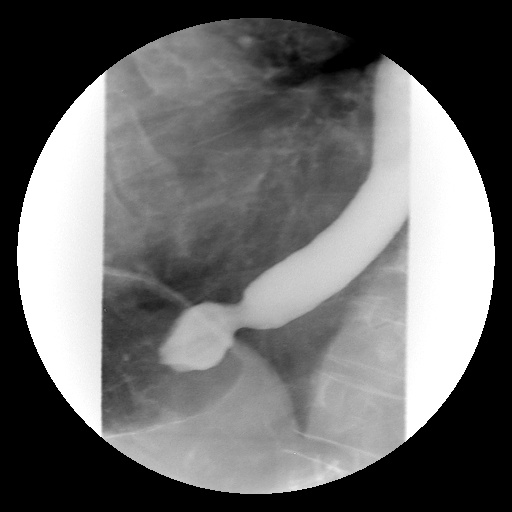
[im 22/25]
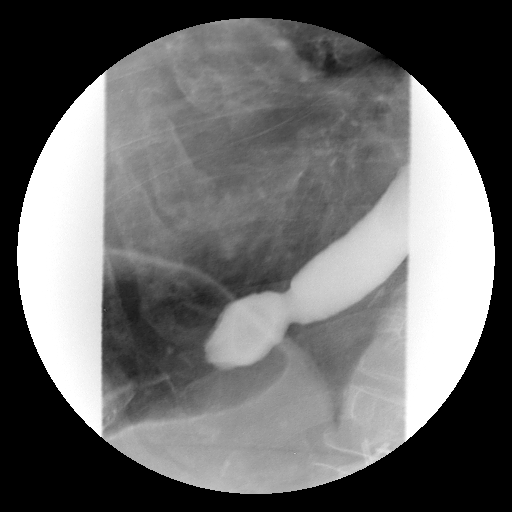
[im 25/25]
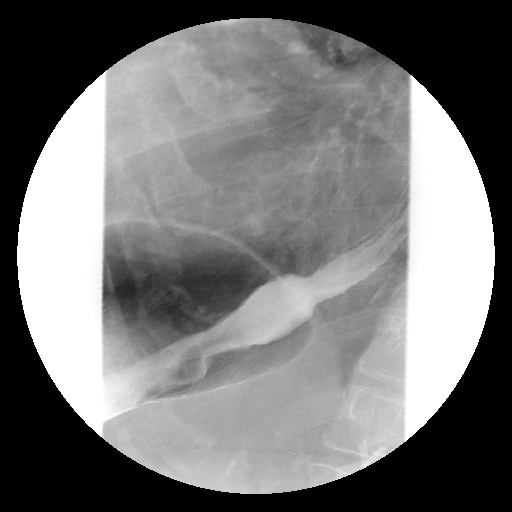

[20 of 24 positions shown; findings below may reference images not displayed]

FINDINGS: Esophageal motility is diminished of a mild to moderate
degree.  Pharyngeal phase of swallowing was normal.  No mass or
diverticulum.

Small hiatal hernia.  Mild narrowing in the gastroesophageal
junction does not appear significant.  Barium tablet passed readily
through this area into the stomach.  No reflux was identified.
IMPRESSION: Mild to moderate dysmotility.

Hiatal hernia without stricture.

## 2013-06-30 ENCOUNTER — Other Ambulatory Visit (HOSPITAL_COMMUNITY): Payer: Self-pay | Admitting: Internal Medicine

## 2013-06-30 DIAGNOSIS — Z1231 Encounter for screening mammogram for malignant neoplasm of breast: Secondary | ICD-10-CM

## 2013-07-10 ENCOUNTER — Encounter (INDEPENDENT_AMBULATORY_CARE_PROVIDER_SITE_OTHER): Payer: Medicare Other | Admitting: Ophthalmology

## 2013-07-10 DIAGNOSIS — H43819 Vitreous degeneration, unspecified eye: Secondary | ICD-10-CM

## 2013-07-10 DIAGNOSIS — H348192 Central retinal vein occlusion, unspecified eye, stable: Secondary | ICD-10-CM

## 2013-07-10 DIAGNOSIS — I10 Essential (primary) hypertension: Secondary | ICD-10-CM

## 2013-07-10 DIAGNOSIS — H35039 Hypertensive retinopathy, unspecified eye: Secondary | ICD-10-CM

## 2013-07-10 DIAGNOSIS — H3530 Unspecified macular degeneration: Secondary | ICD-10-CM

## 2013-08-07 ENCOUNTER — Other Ambulatory Visit (HOSPITAL_COMMUNITY): Payer: Self-pay | Admitting: Internal Medicine

## 2013-08-07 ENCOUNTER — Ambulatory Visit (HOSPITAL_COMMUNITY)
Admission: RE | Admit: 2013-08-07 | Discharge: 2013-08-07 | Disposition: A | Discharge status: Home / Self Care | Payer: Medicare Other | Source: Ambulatory Visit | Attending: Internal Medicine | Admitting: Internal Medicine

## 2013-08-07 DIAGNOSIS — Z1231 Encounter for screening mammogram for malignant neoplasm of breast: Secondary | ICD-10-CM | POA: Insufficient documentation

## 2013-12-11 ENCOUNTER — Ambulatory Visit: Payer: Medicare Other | Admitting: Neurology

## 2014-07-16 ENCOUNTER — Ambulatory Visit (INDEPENDENT_AMBULATORY_CARE_PROVIDER_SITE_OTHER): Payer: Medicare Other | Admitting: Ophthalmology

## 2014-07-16 DIAGNOSIS — H35033 Hypertensive retinopathy, bilateral: Secondary | ICD-10-CM

## 2014-07-16 DIAGNOSIS — H43813 Vitreous degeneration, bilateral: Secondary | ICD-10-CM

## 2014-07-16 DIAGNOSIS — H3531 Nonexudative age-related macular degeneration: Secondary | ICD-10-CM

## 2014-07-16 DIAGNOSIS — H34813 Central retinal vein occlusion, bilateral: Secondary | ICD-10-CM

## 2014-07-16 DIAGNOSIS — I10 Essential (primary) hypertension: Secondary | ICD-10-CM

## 2014-08-20 ENCOUNTER — Inpatient Hospital Stay (HOSPITAL_COMMUNITY)
Admission: EM | Admit: 2014-08-20 | Discharge: 2014-08-22 | DRG: 101 | Disposition: A | Discharge status: Home Health | Payer: Medicare Other | Attending: Internal Medicine | Admitting: Internal Medicine

## 2014-08-20 ENCOUNTER — Observation Stay (HOSPITAL_COMMUNITY): Payer: Medicare Other

## 2014-08-20 ENCOUNTER — Encounter (HOSPITAL_COMMUNITY): Payer: Self-pay

## 2014-08-20 ENCOUNTER — Emergency Department (HOSPITAL_COMMUNITY): Payer: Medicare Other

## 2014-08-20 DIAGNOSIS — R4781 Slurred speech: Secondary | ICD-10-CM | POA: Diagnosis present

## 2014-08-20 DIAGNOSIS — Z981 Arthrodesis status: Secondary | ICD-10-CM

## 2014-08-20 DIAGNOSIS — R404 Transient alteration of awareness: Secondary | ICD-10-CM

## 2014-08-20 DIAGNOSIS — Z7982 Long term (current) use of aspirin: Secondary | ICD-10-CM

## 2014-08-20 DIAGNOSIS — I10 Essential (primary) hypertension: Secondary | ICD-10-CM | POA: Diagnosis present

## 2014-08-20 DIAGNOSIS — R4182 Altered mental status, unspecified: Secondary | ICD-10-CM | POA: Diagnosis present

## 2014-08-20 DIAGNOSIS — I2699 Other pulmonary embolism without acute cor pulmonale: Secondary | ICD-10-CM | POA: Diagnosis present

## 2014-08-20 DIAGNOSIS — E119 Type 2 diabetes mellitus without complications: Secondary | ICD-10-CM

## 2014-08-20 DIAGNOSIS — Z9841 Cataract extraction status, right eye: Secondary | ICD-10-CM

## 2014-08-20 DIAGNOSIS — Z961 Presence of intraocular lens: Secondary | ICD-10-CM | POA: Diagnosis present

## 2014-08-20 DIAGNOSIS — Z79899 Other long term (current) drug therapy: Secondary | ICD-10-CM

## 2014-08-20 DIAGNOSIS — M1990 Unspecified osteoarthritis, unspecified site: Secondary | ICD-10-CM | POA: Diagnosis present

## 2014-08-20 DIAGNOSIS — M109 Gout, unspecified: Secondary | ICD-10-CM | POA: Diagnosis present

## 2014-08-20 DIAGNOSIS — Z85820 Personal history of malignant melanoma of skin: Secondary | ICD-10-CM

## 2014-08-20 DIAGNOSIS — Z96652 Presence of left artificial knee joint: Secondary | ICD-10-CM | POA: Diagnosis present

## 2014-08-20 DIAGNOSIS — R569 Unspecified convulsions: Secondary | ICD-10-CM | POA: Diagnosis not present

## 2014-08-20 DIAGNOSIS — K219 Gastro-esophageal reflux disease without esophagitis: Secondary | ICD-10-CM | POA: Diagnosis present

## 2014-08-20 DIAGNOSIS — Z9842 Cataract extraction status, left eye: Secondary | ICD-10-CM

## 2014-08-20 LAB — GLUCOSE, CAPILLARY: Glucose-Capillary: 174 mg/dL — ABNORMAL HIGH (ref 70–99)

## 2014-08-20 LAB — COMPREHENSIVE METABOLIC PANEL
ALK PHOS: 59 U/L (ref 39–117)
ALT: 28 U/L (ref 0–35)
AST: 38 U/L — AB (ref 0–37)
Albumin: 3.9 g/dL (ref 3.5–5.2)
Anion gap: 17 — ABNORMAL HIGH (ref 5–15)
BUN: 15 mg/dL (ref 6–23)
CALCIUM: 9.7 mg/dL (ref 8.4–10.5)
CO2: 22 mEq/L (ref 19–32)
Chloride: 102 mEq/L (ref 96–112)
Creatinine, Ser: 0.74 mg/dL (ref 0.50–1.10)
GFR calc Af Amer: 89 mL/min — ABNORMAL LOW (ref >90)
GFR calc non Af Amer: 77 mL/min — ABNORMAL LOW (ref >90)
GLUCOSE: 109 mg/dL — AB (ref 70–99)
POTASSIUM: 4.2 meq/L (ref 3.7–5.3)
SODIUM: 141 meq/L (ref 137–147)
TOTAL PROTEIN: 6.8 g/dL (ref 6.0–8.3)
Total Bilirubin: 0.4 mg/dL (ref 0.3–1.2)

## 2014-08-20 LAB — I-STAT TROPONIN, ED: Troponin i, poc: 0 ng/mL (ref 0.00–0.08)

## 2014-08-20 LAB — DIFFERENTIAL
BASOS ABS: 0.1 10*3/uL (ref 0.0–0.1)
Basophils Relative: 1 % (ref 0–1)
EOS PCT: 4 % (ref 0–5)
Eosinophils Absolute: 0.4 10*3/uL (ref 0.0–0.7)
LYMPHS ABS: 2.8 10*3/uL (ref 0.7–4.0)
Lymphocytes Relative: 27 % (ref 12–46)
Monocytes Absolute: 0.7 10*3/uL (ref 0.1–1.0)
Monocytes Relative: 6 % (ref 3–12)
NEUTROS PCT: 62 % (ref 43–77)
Neutro Abs: 6.4 10*3/uL (ref 1.7–7.7)

## 2014-08-20 LAB — CBC
HCT: 37.2 % (ref 36.0–46.0)
Hemoglobin: 12.7 g/dL (ref 12.0–15.0)
MCH: 30 pg (ref 26.0–34.0)
MCHC: 34.1 g/dL (ref 30.0–36.0)
MCV: 87.9 fL (ref 78.0–100.0)
PLATELETS: 222 10*3/uL (ref 150–400)
RBC: 4.23 MIL/uL (ref 3.87–5.11)
RDW: 14.1 % (ref 11.5–15.5)
WBC: 10.4 10*3/uL (ref 4.0–10.5)

## 2014-08-20 LAB — MAGNESIUM: MAGNESIUM: 1.3 mg/dL — AB (ref 1.5–2.5)

## 2014-08-20 LAB — CBG MONITORING, ED: GLUCOSE-CAPILLARY: 111 mg/dL — AB (ref 70–99)

## 2014-08-20 LAB — PROTIME-INR
INR: 1.03 (ref 0.00–1.49)
PROTHROMBIN TIME: 13.6 s (ref 11.6–15.2)

## 2014-08-20 LAB — APTT: aPTT: 24 seconds (ref 24–37)

## 2014-08-20 LAB — PHOSPHORUS: Phosphorus: 3.5 mg/dL (ref 2.3–4.6)

## 2014-08-20 MED ORDER — LEVETIRACETAM IN NACL 1000 MG/100ML IV SOLN
1000.0000 mg | INTRAVENOUS | Status: AC
  Administered 2014-08-20: 1000 mg via INTRAVENOUS
  Filled 2014-08-20 (×2): qty 100

## 2014-08-20 MED ORDER — AMLODIPINE BESYLATE 5 MG PO TABS
5.0000 mg | ORAL_TABLET | Freq: Every day | ORAL | Status: DC
  Administered 2014-08-21 – 2014-08-22 (×2): 5 mg via ORAL
  Filled 2014-08-20 (×2): qty 1

## 2014-08-20 MED ORDER — INSULIN ASPART 100 UNIT/ML ~~LOC~~ SOLN
0.0000 [IU] | Freq: Three times a day (TID) | SUBCUTANEOUS | Status: DC
  Administered 2014-08-21: 3 [IU] via SUBCUTANEOUS
  Administered 2014-08-21: 1 [IU] via SUBCUTANEOUS
  Administered 2014-08-21 – 2014-08-22 (×3): 2 [IU] via SUBCUTANEOUS

## 2014-08-20 MED ORDER — LEVETIRACETAM IN NACL 500 MG/100ML IV SOLN
500.0000 mg | Freq: Two times a day (BID) | INTRAVENOUS | Status: DC
  Administered 2014-08-20 – 2014-08-21 (×2): 500 mg via INTRAVENOUS
  Filled 2014-08-20 (×4): qty 100

## 2014-08-20 MED ORDER — GLIMEPIRIDE 4 MG PO TABS
4.0000 mg | ORAL_TABLET | Freq: Two times a day (BID) | ORAL | Status: DC
  Administered 2014-08-20 – 2014-08-22 (×4): 4 mg via ORAL
  Filled 2014-08-20 (×4): qty 1

## 2014-08-20 MED ORDER — HYDROCODONE-ACETAMINOPHEN 5-325 MG PO TABS: 1.0000 | ORAL_TABLET | Freq: Four times a day (QID) | ORAL | Status: DC | PRN

## 2014-08-20 MED ORDER — CALCIUM CARBONATE 1250 (500 CA) MG PO TABS
1.0000 | ORAL_TABLET | Freq: Every day | ORAL | Status: DC
  Administered 2014-08-21 – 2014-08-22 (×2): 500 mg via ORAL
  Filled 2014-08-20 (×2): qty 1

## 2014-08-20 MED ORDER — PANTOPRAZOLE SODIUM 40 MG PO TBEC
40.0000 mg | DELAYED_RELEASE_TABLET | Freq: Every day | ORAL | Status: DC
  Administered 2014-08-21 – 2014-08-22 (×2): 40 mg via ORAL
  Filled 2014-08-20 (×2): qty 1

## 2014-08-20 MED ORDER — ENOXAPARIN SODIUM 40 MG/0.4ML ~~LOC~~ SOLN
40.0000 mg | SUBCUTANEOUS | Status: DC
  Administered 2014-08-20 – 2014-08-21 (×2): 40 mg via SUBCUTANEOUS
  Filled 2014-08-20 (×2): qty 0.4

## 2014-08-20 MED ORDER — RAMIPRIL 5 MG PO CAPS
10.0000 mg | ORAL_CAPSULE | Freq: Every day | ORAL | Status: DC
  Administered 2014-08-21 – 2014-08-22 (×2): 10 mg via ORAL
  Filled 2014-08-20 (×2): qty 2

## 2014-08-20 MED ORDER — SENNOSIDES-DOCUSATE SODIUM 8.6-50 MG PO TABS: 1.0000 | ORAL_TABLET | Freq: Every evening | ORAL | Status: DC | PRN

## 2014-08-20 MED ORDER — ASPIRIN EC 81 MG PO TBEC
81.0000 mg | DELAYED_RELEASE_TABLET | Freq: Every day | ORAL | Status: DC
Start: 2014-08-21 — End: 2014-08-22
  Administered 2014-08-21 – 2014-08-22 (×2): 81 mg via ORAL
  Filled 2014-08-20 (×2): qty 1

## 2014-08-20 MED ORDER — STROKE: EARLY STAGES OF RECOVERY BOOK
Freq: Once | Status: AC
  Administered 2014-08-20: 23:00:00
  Filled 2014-08-20: qty 1

## 2014-08-20 MED ORDER — CALCIUM CARBONATE 600 MG PO TABS: 600.0000 mg | ORAL_TABLET | Freq: Every day | ORAL | Status: DC

## 2014-08-20 NOTE — ED Provider Notes (Signed)
CSN: [id number]     Arrival date & time 08/20/14  1236 History   First MD Initiated Contact with Patient 08/20/14 1306     Chief Complaint  Patient presents with  . Code Stroke   Level V caveat acute situation, altered mental status  (Consider location/radiation/quality/duration/timing/severity/associated sxs/prior Treatment) HPI History is obtained from patient's sister. Patient has no recall of event. Patient's sister reports that 9 AM today she developed slurred speech, was incontinent of bowel and had unsteady gait. Patient presently feels back to baseline.no other associated symptoms. No treatment prior to coming here. Past Medical History  Diagnosis Date  . Osteoarthritis   . Degenerative joint disease   . Lumbar spinal stenosis   . Diabetes mellitus, type 2   . Hypertension   . Gout   . GERD (gastroesophageal reflux disease)   . Fundic gland polyps of stomach, benign   . Helicobacter pylori gastritis   . Shingles     current shingles- x4 yrs., on L side of face     . Cancer     melanoma- R arm , area excised   . Tubular adenoma of colon 07/2011  . Hiatal hernia    Past Surgical History  Procedure Laterality Date  . Total knee arthroplasty      left  . Lumbar laminectomy      6 prior back surgery   . Cholecystectomy  1998  . Vaginal hysterectomy  1990's  . Cataract extraction      bilateral, /w IOL  . Retinal laser procedure      bilateral  . Rotator cuff repair      bilateral  . Carpal tunnel release      bilateral  . Hemorrhoid surgery      x 2  . Tonsillectomy    . Benign br. biopsy- many yrs. ago    . Spinal fusion  08/22/2011    Procedure: FUSION POSTERIOR SPINAL MULTILEVEL/SCOLIOSIS;  Surgeon: Gunnar Bulla;  Location: [facility];  Service: Orthopedics;  Laterality: N/A;  LUMBAR LAMINECTOMY, EXTENSION OF POSTERIOR LUMBAR FUSION TO T10 WITH K2M RODS, SCREWS, CONNECTORS, HOOKS, ILIAC CREST BONE GRAFT   Family History  Problem Relation Age of Onset  . Colon  cancer Neg Hx   . Anesthesia problems Neg Hx   . Hypotension Neg Hx   . Malignant hyperthermia Neg Hx   . Pseudochol deficiency Neg Hx   . Diabetes Mother   . Cancer Mother     mother  . Diabetes Paternal Aunt     x 1  . Diabetes Paternal Uncle     x 3  . Diabetes Brother   . Diabetes Other     neice  . Heart disease Father   . Cancer Father     gallbladder  . Liver cancer Father   . Heart disease Brother   . Colon polyps Sister    History  Substance Use Topics  . Smoking status: Never Smoker   . Smokeless tobacco: Never Used  . Alcohol Use: No   OB History    No data available     Review of Systems  Unable to perform ROS: Mental status change  Musculoskeletal: Positive for gait problem.  Neurological: Positive for speech difficulty.       Confusion, slurred speech, bowel incontinence      Allergies  Celebrex; Morphine sulfate; and Percocet  Home Medications   Prior to Admission medications   Medication Sig Start Date End Date Taking?  Authorizing Provider  amLODipine (NORVASC) 5 MG tablet Take 5 mg by mouth daily.      Historical Provider, MD  aspirin EC 81 MG tablet Take 81 mg by mouth daily.    Historical Provider, MD  glimepiride (AMARYL) 4 MG tablet Take 4 mg by mouth 2 (two) times daily.    Historical Provider, MD  HYDROcodone-acetaminophen (NORCO/VICODIN) 5-325 MG per tablet Take 1 tablet by mouth every 6 (six) hours as needed for pain.    Historical Provider, MD  metFORMIN (GLUCOPHAGE) 500 MG tablet Take 500 mg by mouth 3 (three) times daily.     Historical Provider, MD  omeprazole (PRILOSEC) 20 MG capsule Take 20 mg by mouth daily.    Historical Provider, MD  Polyethyl Glycol-Propyl Glycol (SYSTANE OP) Apply 1 drop to eye daily as needed. For dry eyes    Historical Provider, MD  predniSONE (DELTASONE) 5 MG tablet Take 5-30 mg by mouth daily. pred 6 day pak. Takes 6 tablets day 1, then tapers down 1 tablet per day    Historical Provider, MD  ramipril  (ALTACE) 10 MG capsule Take 10 mg by mouth daily.      Historical Provider, MD   BP 148/59 mmHg  Pulse 87  Temp(Src) 97.6 F (36.4 C) (Oral)  Resp 20  Ht 5\' 4"  (1.626 m)  Wt 162 lb 12.8 oz (73.846 kg)  BMI 27.93 kg/m2  SpO2 99% Physical Exam  Constitutional: She appears well-developed and well-nourished.  HENT:  Head: Normocephalic and atraumatic.  Eyes: Conjunctivae are normal. Pupils are equal, round, and reactive to light.  Neck: Neck supple. No tracheal deviation present. No thyromegaly present.  Cardiovascular: Normal rate and regular rhythm.   No murmur heard. Pulmonary/Chest: Effort normal and breath sounds normal.  Abdominal: Soft. Bowel sounds are normal. She exhibits no distension. There is no tenderness.  Musculoskeletal: Normal range of motion. She exhibits no edema or tenderness.  Neurological: She is alert. She has normal reflexes. Coordination normal.  DTRs symmetric bilaterally knee jerk ankle jerk biceps disorder bilaterally. Motor strength 5 over 5 overall  Skin: Skin is warm and dry. No rash noted.  Psychiatric: She has a normal mood and affect.  Nursing note and vitals reviewed.   ED Course  Procedures (including critical care time) Labs Review Labs Reviewed  CBG MONITORING, ED - Abnormal; Notable for the following:    Glucose-Capillary 111 (*)    All other components within normal limits    Imaging Review No results found.   EKG Interpretation   Date/Time:  Thursday August 20 2014 12:57:19 EST Ventricular Rate:  88 PR Interval:  203 QRS Duration: 127 QT Interval:  383 QTC Calculation: 463 R Axis:   -3 Text Interpretation:  Sinus rhythm Right bundle branch block ST elevation,  consider inferior injury No significant change since last tracing  Confirmed by Porschea Borys  MD, Kyren Knick [phone]) on 08/20/2014 1:31:44 PM       1:40 PM patient's sons are here but states that she looks at baseline. Dr. Nicole Kindred, neurology evaluated patient in the emergency  department. Feels stroke highly unlikely. Symptoms moreconsistent with seizure. He requests inpatient stay under medical service. Results for orders placed or performed during the hospital encounter of 08/20/14  Protime-INR  Result Value Ref Range   Prothrombin Time 13.6 11.6 - 15.2 seconds   INR 1.03 0.00 - 1.49  APTT  Result Value Ref Range   aPTT 24 24 - 37 seconds  CBC  Result Value Ref  Range   WBC 10.4 4.0 - 10.5 K/uL   RBC 4.23 3.87 - 5.11 MIL/uL   Hemoglobin 12.7 12.0 - 15.0 g/dL   HCT 37.2 36.0 - 46.0 %   MCV 87.9 78.0 - 100.0 fL   MCH 30.0 26.0 - 34.0 pg   MCHC 34.1 30.0 - 36.0 g/dL   RDW 14.1 11.5 - 15.5 %   Platelets 222 150 - 400 K/uL  Differential  Result Value Ref Range   Neutrophils Relative % 62 43 - 77 %   Neutro Abs 6.4 1.7 - 7.7 K/uL   Lymphocytes Relative 27 12 - 46 %   Lymphs Abs 2.8 0.7 - 4.0 K/uL   Monocytes Relative 6 3 - 12 %   Monocytes Absolute 0.7 0.1 - 1.0 K/uL   Eosinophils Relative 4 0 - 5 %   Eosinophils Absolute 0.4 0.0 - 0.7 K/uL   Basophils Relative 1 0 - 1 %   Basophils Absolute 0.1 0.0 - 0.1 K/uL  Comprehensive metabolic panel  Result Value Ref Range   Sodium 141 137 - 147 mEq/L   Potassium 4.2 3.7 - 5.3 mEq/L   Chloride 102 96 - 112 mEq/L   CO2 22 19 - 32 mEq/L   Glucose, Bld 109 (H) 70 - 99 mg/dL   BUN 15 6 - 23 mg/dL   Creatinine, Ser 0.74 0.50 - 1.10 mg/dL   Calcium 9.7 8.4 - 10.5 mg/dL   Total Protein 6.8 6.0 - 8.3 g/dL   Albumin 3.9 3.5 - 5.2 g/dL   AST 38 (H) 0 - 37 U/L   ALT 28 0 - 35 U/L   Alkaline Phosphatase 59 39 - 117 U/L   Total Bilirubin 0.4 0.3 - 1.2 mg/dL   GFR calc non Af Amer 77 (L) >90 mL/min   GFR calc Af Amer 89 (L) >90 mL/min   Anion gap 17 (H) 5 - 15  Magnesium  Result Value Ref Range   Magnesium 1.3 (L) 1.5 - 2.5 mg/dL  Phosphorus  Result Value Ref Range   Phosphorus 3.5 2.3 - 4.6 mg/dL  CBG monitoring, ED  Result Value Ref Range   Glucose-Capillary 111 (H) 70 - 99 mg/dL  I-stat troponin, ED (not  at [facility])  Result Value Ref Range   Troponin i, poc 0.00 0.00 - 0.08 ng/mL   Comment 3           Ct Head (brain) Wo Contrast  08/20/2014   CLINICAL DATA:  Code stroke. Slurred speech. Altered mental status. Incontinence. Loss of bowel function. Initial encounter. Altered mental status.  EXAM: CT HEAD WITHOUT CONTRAST  TECHNIQUE: Contiguous axial images were obtained from the base of the skull through the vertex without intravenous contrast.  COMPARISON:  02/24/2013.  FINDINGS: No mass lesion, mass effect, midline shift, hydrocephalus, hemorrhage. No acute territorial cortical ischemia/infarct. Atrophy and chronic ischemic white matter disease is present.  Visible mastoid air cells appear clear. Intracranial atherosclerosis is present. Gray-white differentiation appears normal and symmetric. Unchanged dural calcification over the LEFT tentorium. RIGHT maxillary mucosal thickening is present. Chronic partial LEFT sphenoid sinus opacification with calcifications. Calcification of the transverse ligament of the atlas incidentally noted.  IMPRESSION: 1. Age expected atrophy and chronic ischemic white matter disease without acute intracranial abnormality. 2. Critical Value/emergent results were called by telephone at the time of interpretation on 08/20/2014 at 1:11 pm to Dr. Nicole Kindred , who verbally acknowledged these results.   Electronically Signed   By: Arvilla Market.D.  On: 08/20/2014 13:12    MDM  Spoke with Dr.Mikhail,  Plan: 23 hour observation telemetry Diagnosis altered mental status Final diagnoses:  None        Orlie Dakin, MD 08/20/14 1425

## 2014-08-20 NOTE — Progress Notes (Signed)
Pt arrived to the unit.  Welcomed and settled into her room.  Telemetry applied, call bell within reach. Cori Razor, RN

## 2014-08-20 NOTE — Consult Note (Signed)
Referring Physician: Winfred Leeds    Chief Complaint: Code stroke  HPI:                                                                                                                                         [name] is an 80 y.o. female who was noted by sister to be not acting right on the way to [facility] [employer] [facility].  She states she was picking through her purse and her speech was nonsensical. When they arrived at [facility] she remained in a confused state then proceeded to say she had to go to the bathroom.  While in the ladies room she had defecated on herself and called for her sisters assistance.  Her sister felt she was significantly wrse, talking nonsensically and not following commands well.  Fire dept happened to be in facility and told her to bring her to the hospital  On arrival patient remains slow with mentation but showing no focal deficits.   Date last known well: Date: 08/20/2014 Time last known well: Time: 09:00 tPA Given: No: symptoms resolved  Past Medical History  Diagnosis Date  . Osteoarthritis   . Degenerative joint disease   . Lumbar spinal stenosis   . Diabetes mellitus, type 2   . Hypertension   . Gout   . GERD (gastroesophageal reflux disease)   . Fundic gland polyps of stomach, benign   . Helicobacter pylori gastritis   . Shingles     current shingles- x4 yrs., on L side of face     . Cancer     melanoma- R arm , area excised   . Tubular adenoma of colon 07/2011  . Hiatal hernia     Past Surgical History  Procedure Laterality Date  . Total knee arthroplasty      left  . Lumbar laminectomy      6 prior back surgery   . Cholecystectomy  1998  . Vaginal hysterectomy  1990's  . Cataract extraction      bilateral, /w IOL  . Retinal laser procedure      bilateral  . Rotator cuff repair      bilateral  . Carpal tunnel release      bilateral  . Hemorrhoid surgery      x 2  . Tonsillectomy    . Benign br. biopsy- many yrs. ago    . Spinal fusion  08/22/2011     Procedure: FUSION POSTERIOR SPINAL MULTILEVEL/SCOLIOSIS;  Surgeon: Gunnar Bulla;  Location: [facility];  Service: Orthopedics;  Laterality: N/A;  LUMBAR LAMINECTOMY, EXTENSION OF POSTERIOR LUMBAR FUSION TO T10 WITH K2M RODS, SCREWS, CONNECTORS, HOOKS, ILIAC CREST BONE GRAFT    Family History  Problem Relation Age of Onset  . Colon cancer Neg Hx   . Anesthesia problems Neg Hx   . Hypotension Neg Hx   . Malignant hyperthermia Neg Hx   .  Pseudochol deficiency Neg Hx   . Diabetes Mother   . Cancer Mother     mother  . Diabetes Paternal Aunt     x 1  . Diabetes Paternal Uncle     x 3  . Diabetes Brother   . Diabetes Other     neice  . Heart disease Father   . Cancer Father     gallbladder  . Liver cancer Father   . Heart disease Brother   . Colon polyps Sister    Social History:  reports that she has never smoked. She has never used smokeless tobacco. She reports that she does not drink alcohol or use illicit drugs.  Allergies:  Allergies  Allergen Reactions  . Celebrex [Celecoxib] Swelling  . Morphine Sulfate Nausea Only  . Percocet [Oxycodone-Acetaminophen] Itching    Medications:                                                                                                                           No current facility-administered medications for this encounter.   Current Outpatient Prescriptions  Medication Sig Dispense Refill  . amLODipine (NORVASC) 5 MG tablet Take 5 mg by mouth daily.      [name] aspirin EC 81 MG tablet Take 81 mg by mouth daily.    [name] glimepiride (AMARYL) 4 MG tablet Take 4 mg by mouth 2 (two) times daily.    [name] HYDROcodone-acetaminophen (NORCO/VICODIN) 5-325 MG per tablet Take 1 tablet by mouth every 6 (six) hours as needed for pain.    . metFORMIN (GLUCOPHAGE) 500 MG tablet Take 500 mg by mouth 3 (three) times daily.     [name] omeprazole (PRILOSEC) 20 MG capsule Take 20 mg by mouth daily.    Vladimir Faster Glycol-Propyl Glycol (SYSTANE OP) Apply 1 drop to  eye daily as needed. For dry eyes    . predniSONE (DELTASONE) 5 MG tablet Take 5-30 mg by mouth daily. pred 6 day pak. Takes 6 tablets day 1, then tapers down 1 tablet per day    . ramipril (ALTACE) 10 MG capsule Take 10 mg by mouth daily.         ROS:                                                                                                                                       History obtained  from the patient and and sister  General ROS: negative for - chills, fatigue, fever, night sweats, weight gain or weight loss Psychological ROS: negative for - behavioral disorder, hallucinations, memory difficulties, mood swings or suicidal ideation Ophthalmic ROS: negative for - blurry vision, double vision, eye pain or loss of vision ENT ROS: negative for - epistaxis, nasal discharge, oral lesions, sore throat, tinnitus or vertigo Allergy and Immunology ROS: negative for - hives or itchy/watery eyes Hematological and Lymphatic ROS: negative for - bleeding problems, bruising or swollen lymph nodes Endocrine ROS: negative for - galactorrhea, hair pattern changes, polydipsia/polyuria or temperature intolerance Respiratory ROS: negative for - cough, hemoptysis, shortness of breath or wheezing Cardiovascular ROS: negative for - chest pain, dyspnea on exertion, edema or irregular heartbeat Gastrointestinal ROS: negative for - abdominal pain, diarrhea, hematemesis, nausea/vomiting or stool incontinence Genito-Urinary ROS: negative for - dysuria, hematuria, incontinence or urinary frequency/urgency Musculoskeletal ROS: negative for - joint swelling or muscular weakness Neurological ROS: as noted in HPI Dermatological ROS: negative for rash and skin lesion changes  Neurologic Examination:                                                                                                      Blood pressure 154/66, pulse 85, temperature 97.6 F (36.4 C), temperature source Oral, resp. rate 14, height 5'  4" (1.626 m), weight 73.846 kg (162 lb 12.8 oz), SpO2 98 %.   General: NAD Mental Status: Alert, oriented, thought content appropriate.  Speech fluent without evidence of aphasia.  She is slow to respond. Able to follow 3 step commands without difficulty. Cranial Nerves: II: Discs flat bilaterally; Visual fields grossly normal, pupils equal, round, reactive to light and accommodation III,IV, VI: ptosis not present, extra-ocular motions intact bilaterally V,VII: smile symmetric, facial light touch sensation normal bilaterally VIII: hearing normal bilaterally IX,X: gag reflex present XI: bilateral shoulder shrug XII: midline tongue extension without atrophy or fasciculations  Motor: Right : Upper extremity   5/5    Left:     Upper extremity   5/5  Lower extremity   5/5     Lower extremity   5/5 Tone and bulk:normal tone throughout; no atrophy noted Sensory: Pinprick and light touch intact throughout, bilaterally Deep Tendon Reflexes:  1+ throughout UE and in KJ. No AJ noted  Plantars: Right: downgoing   Left: downgoing Cerebellar: normal finger-to-nose,  normal heel-to-shin test Gait: not tested CV: pulses palpable throughout    Lab Results: Basic Metabolic Panel: No results for input(s): NA, K, CL, CO2, GLUCOSE, BUN, CREATININE, CALCIUM, MG, PHOS in the last 168 hours.  Liver Function Tests: No results for input(s): AST, ALT, ALKPHOS, BILITOT, PROT, ALBUMIN in the last 168 hours. No results for input(s): LIPASE, AMYLASE in the last 168 hours. No results for input(s): AMMONIA in the last 168 hours.  CBC: No results for input(s): WBC, NEUTROABS, HGB, HCT, MCV, PLT in the last 168 hours.  Cardiac Enzymes: No results for input(s): CKTOTAL, CKMB, CKMBINDEX, TROPONINI in the last 168 hours.  Lipid Panel: No results for input(s): CHOL,  TRIG, HDL, CHOLHDL, VLDL, LDLCALC in the last 168 hours.  CBG:  Recent Labs Lab 08/20/14 1300  GLUCAP 111*    Microbiology: Results  for orders placed or performed in visit on 11/28/11  Clostridium Difficile by PCR     Status: None   Collection Time: 11/28/11 11:50 AM  Result Value Ref Range Status   C difficile by pcr Not Detected Not Detected Final    Comment:   This assay detects the presence of Clostridium difficile DNA coding for toxin B (tcdB) by real-time polymerase chain reaction (PCR) amplification. This test was developed and its performance characteristics have been determined by [employer]. Performance characteristics refer to the analytical performance of the test. This test has not been cleared or approved by the [place] Food and Drug Administration. The FDA has determined that such clearance or approval is not necessary. This laboratory is certified under the [place] as qualified to perform high complexity clinical laboratory testing.    Coagulation Studies: No results for input(s): LABPROT, INR in the last 72 hours.  Imaging: No results found.     Assessment and plan discussed with with attending physician and they are in agreement.    Etta Quill PA-C [facility] [phone]  08/20/2014, 1:12 PM   Assessment: 80 y.o. female brought to ED due to symptoms of confusion and fecal incontinence. Code stroke was called and at time of arrival she showed no localizing or lateralizing abnormalities. tPA was not administered due to minimal findings. Given history, she likely has suffered from a seizure and remains postictal with slow mentation.   Stroke Risk Factors - diabetes mellitus and hypertension   Recommend: 1) MRI brain with and without contrast 2) EEG 3) Start Keppra 1 gram IV now followed by 500 mg BID  I personally participated in this patient's evaluation and management and I am in agreement with the above clinical assessment and management recommendations.  Rush Farmer M.D. [facility] [phone]

## 2014-08-20 NOTE — ED Notes (Signed)
Seizure pads applied.

## 2014-08-20 NOTE — H&P (Signed)
[facility] History and Physical  [name] ZOX:[id number] DOB: [birth year] DOA: 08/20/2014  Referring physician: Dr. Orlie Dakin PCP: Jani Gravel, MD  Specialists:   Chief Complaint: Altered mental status, slurred speech  HPI: [name] is a 80 y.o. female  With a history of hypertension, diabetes mellitus type 2, who  presented to the emergency department with complaints of slurred speech.  On the day of admission, patient was with her sister at [facility] for grocery shopping. Information was obtained from EDP and patient. Patient sister who was not at bedside, noticed that the patient was acting differently and had slurred speech that was nonsensical.  Patient remained confused while at [facility] however stated to her sister she needed to go to the bathroom. While in the ladies room, patient noticed that she had defecated on herself and called sister for assistance. Patient's sister noticed that she was progressively worsening and still had nonsensical speech. On arrival to the emergency department patient was noted to be confused on admission however patient's symptoms seem to have resolved. CT of the brain showed no acute abnormalities. Code stroke was called and neurology was consulted.  Review of Systems:  Constitutional: Denies fever, chills, diaphoresis, appetite change and fatigue.  HEENT: Denies photophobia, eye pain, redness, hearing loss, ear pain, congestion, sore throat, rhinorrhea, sneezing, mouth sores, trouble swallowing, neck pain, neck stiffness and tinnitus.   Respiratory: Denies SOB, DOE, cough, chest tightness,  and wheezing.   Cardiovascular: Denies chest pain, palpitations and leg swelling.  Gastrointestinal: Denies nausea, vomiting, abdominal pain, diarrhea, constipation, blood in stool and abdominal distention.  Genitourinary: Denies dysuria, urgency, frequency, hematuria, flank pain and difficulty urinating.  Musculoskeletal: complains of knee  pain. Skin: Denies pallor, rash and wound.  Neurological: Denies dizziness, seizures, syncope, weakness, light-headedness, numbness and headaches.  Hematological: Denies adenopathy. Easy bruising, personal or family bleeding history  Psychiatric/Behavioral: Denies suicidal ideation, mood changes, confusion, nervousness, sleep disturbance and agitation  Past Medical History  Diagnosis Date  . Osteoarthritis   . Degenerative joint disease   . Lumbar spinal stenosis   . Diabetes mellitus, type 2   . Hypertension   . Gout   . GERD (gastroesophageal reflux disease)   . Fundic gland polyps of stomach, benign   . Helicobacter pylori gastritis   . Shingles     current shingles- x4 yrs., on L side of face     . Cancer     melanoma- R arm , area excised   . Tubular adenoma of colon 07/2011  . Hiatal hernia    Past Surgical History  Procedure Laterality Date  . Total knee arthroplasty      left  . Lumbar laminectomy      6 prior back surgery   . Cholecystectomy  1998  . Vaginal hysterectomy  1990's  . Cataract extraction      bilateral, /w IOL  . Retinal laser procedure      bilateral  . Rotator cuff repair      bilateral  . Carpal tunnel release      bilateral  . Hemorrhoid surgery      x 2  . Tonsillectomy    . Benign br. biopsy- many yrs. ago    . Spinal fusion  08/22/2011    Procedure: FUSION POSTERIOR SPINAL MULTILEVEL/SCOLIOSIS;  Surgeon: Gunnar Bulla;  Location: [place];  Service: Orthopedics;  Laterality: N/A;  LUMBAR LAMINECTOMY, EXTENSION OF POSTERIOR LUMBAR FUSION TO T10 WITH K2M RODS, SCREWS, CONNECTORS,  HOOKS, ILIAC CREST BONE GRAFT   Social History:  reports that she has never smoked. She has never used smokeless tobacco. She reports that she does not drink alcohol or use illicit drugs.   Allergies  Allergen Reactions  . Celebrex [Celecoxib] Swelling  . Morphine Sulfate Nausea Only  . Percocet [Oxycodone-Acetaminophen] Itching    Family History  Problem  Relation Age of Onset  . Colon cancer Neg Hx   . Anesthesia problems Neg Hx   . Hypotension Neg Hx   . Malignant hyperthermia Neg Hx   . Pseudochol deficiency Neg Hx   . Diabetes Mother   . Cancer Mother     mother  . Diabetes Paternal Aunt     x 1  . Diabetes Paternal Uncle     x 3  . Diabetes Brother   . Diabetes Other     neice  . Heart disease Father   . Cancer Father     gallbladder  . Liver cancer Father   . Heart disease Brother   . Colon polyps Sister     Prior to Admission medications   Medication Sig Start Date End Date Taking? Authorizing Provider  amLODipine (NORVASC) 5 MG tablet Take 5 mg by mouth daily.     Yes Historical Provider, MD  aspirin EC 81 MG tablet Take 81 mg by mouth daily.   Yes Historical Provider, MD  calcium carbonate (OS-CAL) 600 MG TABS tablet Take 600 mg by mouth daily with breakfast.   Yes Historical Provider, MD  glimepiride (AMARYL) 4 MG tablet Take 4 mg by mouth 2 (two) times daily.   Yes Historical Provider, MD  HYDROcodone-acetaminophen (NORCO/VICODIN) 5-325 MG per tablet Take 1 tablet by mouth every 6 (six) hours as needed for pain.   Yes Historical Provider, MD  metFORMIN (GLUCOPHAGE) 500 MG tablet Take 500 mg by mouth 4 (four) times daily.    Yes Historical Provider, MD  omeprazole (PRILOSEC) 20 MG capsule Take 20 mg by mouth daily.   Yes Historical Provider, MD  Polyethyl Glycol-Propyl Glycol (SYSTANE OP) Apply 1 drop to eye daily as needed. For dry eyes   Yes Historical Provider, MD  ramipril (ALTACE) 10 MG capsule Take 10 mg by mouth daily.     Yes Historical Provider, MD   Physical Exam: Filed Vitals:   08/20/14 1445  BP: 146/68  Pulse: 83  Temp:   Resp: 19     General: Well developed, well nourished, NAD, appears stated age  HEENT: NCAT, PERRLA, EOMI, Anicteic Sclera, mucous membranes moist.   Neck: Supple, no JVD, no masses  Cardiovascular: S1 S2 auscultated, no rubs, murmurs or gallops. Regular rate and  rhythm.  Respiratory: Clear to auscultation bilaterally with equal chest rise  Abdomen: Soft, nontender, nondistended, + bowel sounds  Extremities: warm dry without cyanosis clubbing or edema  Neuro: AAOx3, cranial nerves grossly intact. Strength 5/5 in patient's upper and lower extremities bilaterally, sensation intact  Skin: Without rashes exudates or nodules  Psych: Normal affect and demeanor with intact judgement and insight  Labs on Admission:  Basic Metabolic Panel:  Recent Labs Lab 08/20/14 1300 08/20/14 1324  NA 141  --   K 4.2  --   CL 102  --   CO2 22  --   GLUCOSE 109*  --   BUN 15  --   CREATININE 0.74  --   CALCIUM 9.7  --   MG  --  1.3*  PHOS  --  3.5  Liver Function Tests:  Recent Labs Lab 08/20/14 1300  AST 38*  ALT 28  ALKPHOS 59  BILITOT 0.4  PROT 6.8  ALBUMIN 3.9   No results for input(s): LIPASE, AMYLASE in the last 168 hours. No results for input(s): AMMONIA in the last 168 hours. CBC:  Recent Labs Lab 08/20/14 1300  WBC 10.4  NEUTROABS 6.4  HGB 12.7  HCT 37.2  MCV 87.9  PLT 222   Cardiac Enzymes: No results for input(s): CKTOTAL, CKMB, CKMBINDEX, TROPONINI in the last 168 hours.  BNP (last 3 results) No results for input(s): PROBNP in the last 8760 hours. CBG:  Recent Labs Lab 08/20/14 1300  GLUCAP 111*    Radiological Exams on Admission: Ct Head (brain) Wo Contrast  08/20/2014   CLINICAL DATA:  Code stroke. Slurred speech. Altered mental status. Incontinence. Loss of bowel function. Initial encounter. Altered mental status.  EXAM: CT HEAD WITHOUT CONTRAST  TECHNIQUE: Contiguous axial images were obtained from the base of the skull through the vertex without intravenous contrast.  COMPARISON:  02/24/2013.  FINDINGS: No mass lesion, mass effect, midline shift, hydrocephalus, hemorrhage. No acute territorial cortical ischemia/infarct. Atrophy and chronic ischemic white matter disease is present.  Visible mastoid air cells  appear clear. Intracranial atherosclerosis is present. Gray-white differentiation appears normal and symmetric. Unchanged dural calcification over the LEFT tentorium. RIGHT maxillary mucosal thickening is present. Chronic partial LEFT sphenoid sinus opacification with calcifications. Calcification of the transverse ligament of the atlas incidentally noted.  IMPRESSION: 1. Age expected atrophy and chronic ischemic white matter disease without acute intracranial abnormality. 2. Critical Value/emergent results were called by telephone at the time of interpretation on 08/20/2014 at 1:11 pm to Dr. Nicole Kindred , who verbally acknowledged these results.   Electronically Signed   By: Dereck Ligas M.D.   On: 08/20/2014 13:12    EKG: Independently reviewed. Sinus rhythm, rate 88, first-degree AV block PR 203, right bundle  Assessment/Plan  Altered mental status and slurred speech, rule out CVA versus seizure -Patient will be admitted to telemetry, for monitoring any tachycardia or bradycardia arrhythmias -CT of the head: no acute intracranial abnormalities -Neurology consult appreciated and recommended EEG as well as MRI and MRA of the brain -Patient was given Keppra 1 g IV and started on 500 mg twice a day -Will obtain echocardiogram and carotid Doppler if MRI is positive for stroke -Will obtain hemoglobin A1c as well as lipid panel -PT and OT consulted for evaluation and treatment  Diabetes mellitus, type II -Will hold metformin -continue Amaryl -Will obtain hemoglobin A1c and place patient on insulin sliding scale with CBG monitoring  Hypertension -Continue ramipril and amlodipine  GERD -Continue PPI  Osteoarthritis/degenerative joint disease -continue home pain regimen  DVT prophylaxis: Lovenox  Code Status: full  Condition: guarded  Family Communication: none at bedside. Admission, patients condition and plan of care including tests being ordered have been discussed with the patient, who  indicates understanding and agrees with the plan and Code Status.  Disposition Plan: admitted for observation  Time spent: 60 minutes  Pacey Willadsen D.O. [facility] Pager [phone]  If 7PM-7AM, please contact night-coverage [email or web address] Password [email or web address] 08/20/2014, 3:00 PM

## 2014-08-20 NOTE — Progress Notes (Signed)
EEG completed; results pending.    

## 2014-08-20 NOTE — ED Notes (Signed)
Pt was at [place] with her sister when her sister reports pt began to have slurred speech, bowel incontinence and altered mental status.  Pt reports she was recently placed on Metformin and believes that is the reason she lost control of her bowels.  Sister reports that pt began talking about having to go home to bake pies.  Pt does not recall the abnormal behavior but reports that she does remember her trip to [place].

## 2014-08-20 NOTE — ED Notes (Signed)
EEG being preformed at bedside

## 2014-08-20 NOTE — Code Documentation (Signed)
80yo female arriving to [facility] via private vehicle.  Per patient's sister they had gone to breakfast then went to [employer] to shop.  While in [place] at 0900 patient began c/o feeling "drunk".  Patient's sister reports that she was making comments that were unrelated to the situation and was not making any sense.  Patient went to the restroom and was taking a long time so her sister went to check on her and she had been incontinent of bowel and had to be cleaned up.  Patient denies LOC and does not recall acting/speaking unusually.  Patient was transported to her primary care physician then to the hospital by her sister.  Code stroke called in triage.  Patient taken to CT.  Stroke team to the bedside.  NIHSS 1 on arrival for left face decreased sensation which is her baseline from the shingles virus.  Dr. Nicole Kindred to the bedside.  Code Stroke canceled at 1310.  Bedside handoff with ED RNs [place] and Abigail Butts.

## 2014-08-20 NOTE — ED Notes (Signed)
CBG 111 

## 2014-08-21 ENCOUNTER — Inpatient Hospital Stay (HOSPITAL_COMMUNITY): Payer: Medicare Other

## 2014-08-21 DIAGNOSIS — Z9842 Cataract extraction status, left eye: Secondary | ICD-10-CM | POA: Diagnosis not present

## 2014-08-21 DIAGNOSIS — Z79899 Other long term (current) drug therapy: Secondary | ICD-10-CM | POA: Diagnosis not present

## 2014-08-21 DIAGNOSIS — Z85820 Personal history of malignant melanoma of skin: Secondary | ICD-10-CM | POA: Diagnosis not present

## 2014-08-21 DIAGNOSIS — R404 Transient alteration of awareness: Secondary | ICD-10-CM | POA: Diagnosis present

## 2014-08-21 DIAGNOSIS — M109 Gout, unspecified: Secondary | ICD-10-CM | POA: Diagnosis present

## 2014-08-21 DIAGNOSIS — K210 Gastro-esophageal reflux disease with esophagitis: Secondary | ICD-10-CM

## 2014-08-21 DIAGNOSIS — I10 Essential (primary) hypertension: Secondary | ICD-10-CM | POA: Diagnosis present

## 2014-08-21 DIAGNOSIS — Z96652 Presence of left artificial knee joint: Secondary | ICD-10-CM | POA: Diagnosis present

## 2014-08-21 DIAGNOSIS — E119 Type 2 diabetes mellitus without complications: Secondary | ICD-10-CM | POA: Diagnosis present

## 2014-08-21 DIAGNOSIS — Z7982 Long term (current) use of aspirin: Secondary | ICD-10-CM | POA: Diagnosis not present

## 2014-08-21 DIAGNOSIS — Z981 Arthrodesis status: Secondary | ICD-10-CM | POA: Diagnosis not present

## 2014-08-21 DIAGNOSIS — M1990 Unspecified osteoarthritis, unspecified site: Secondary | ICD-10-CM | POA: Diagnosis present

## 2014-08-21 DIAGNOSIS — K219 Gastro-esophageal reflux disease without esophagitis: Secondary | ICD-10-CM | POA: Diagnosis present

## 2014-08-21 DIAGNOSIS — Z961 Presence of intraocular lens: Secondary | ICD-10-CM | POA: Diagnosis present

## 2014-08-21 DIAGNOSIS — R569 Unspecified convulsions: Secondary | ICD-10-CM | POA: Diagnosis present

## 2014-08-21 DIAGNOSIS — Z9841 Cataract extraction status, right eye: Secondary | ICD-10-CM | POA: Diagnosis not present

## 2014-08-21 LAB — GLUCOSE, CAPILLARY
GLUCOSE-CAPILLARY: 201 mg/dL — AB (ref 70–99)
Glucose-Capillary: 132 mg/dL — ABNORMAL HIGH (ref 70–99)
Glucose-Capillary: 164 mg/dL — ABNORMAL HIGH (ref 70–99)
Glucose-Capillary: 150 mg/dL — ABNORMAL HIGH (ref 70–99)

## 2014-08-21 LAB — LIPID PANEL
Cholesterol: 204 mg/dL — ABNORMAL HIGH (ref 0–200)
HDL: 41 mg/dL (ref >39)
LDL Cholesterol: 103 mg/dL — ABNORMAL HIGH (ref 0–99)
TRIGLYCERIDES: 302 mg/dL — AB (ref <150)
Total CHOL/HDL Ratio: 5 RATIO
VLDL: 60 mg/dL — AB (ref 0–40)

## 2014-08-21 LAB — HEMOGLOBIN A1C
Hgb A1c MFr Bld: 7.9 % — ABNORMAL HIGH (ref <5.7)
MEAN PLASMA GLUCOSE: 180 mg/dL — AB (ref <117)

## 2014-08-21 MED ORDER — MAGNESIUM SULFATE 2 GM/50ML IV SOLN
2.0000 g | Freq: Once | INTRAVENOUS | Status: AC
  Administered 2014-08-21: 2 g via INTRAVENOUS
  Filled 2014-08-21: qty 50

## 2014-08-21 MED ORDER — ATORVASTATIN CALCIUM 10 MG PO TABS
10.0000 mg | ORAL_TABLET | Freq: Every day | ORAL | Status: DC
Start: 2014-08-21 — End: 2014-08-22
  Administered 2014-08-21: 10 mg via ORAL
  Filled 2014-08-21: qty 1

## 2014-08-21 MED ORDER — GADOBENATE DIMEGLUMINE 529 MG/ML IV SOLN
15.0000 mL | Freq: Once | INTRAVENOUS | Status: AC | PRN
  Administered 2014-08-21: 15 mL via INTRAVENOUS

## 2014-08-21 MED ORDER — LEVETIRACETAM IN NACL 500 MG/100ML IV SOLN
500.0000 mg | Freq: Two times a day (BID) | INTRAVENOUS | Status: DC
  Administered 2014-08-21 – 2014-08-22 (×2): 500 mg via INTRAVENOUS
  Filled 2014-08-21 (×5): qty 100

## 2014-08-21 NOTE — Progress Notes (Signed)
UR completed 

## 2014-08-21 NOTE — Progress Notes (Signed)
Patient Demographics  [name], is a 80 y.o. female, DOB - 08/14/1928, VXB:[id number]  Admit date - 08/20/2014   Admitting Physician Cristal Ford, DO  Outpatient Primary MD for the patient is Jani Gravel, MD  LOS - 1   Chief Complaint  Patient presents with  . Code Stroke        Subjective:   [name] today has, No headache, No chest pain, No abdominal pain - No Nausea, No new weakness tingling or numbness, No Cough - SOB.    Assessment & Plan     1.Slurred Speech / Confusion - CVA vs Seizure - head CT stable, full stroke workup underway, MRI-Echo-Carotids, A1c pending, Neuro following, on ASA, add statin as LDL >70. Keppra per neuro, EEG pending.    2. DM2 - A1c pending, Amaryl + ISS  No results found for: HGBA1C  CBG (last 3)   Recent Labs  08/20/14 1300 08/20/14 2240 08/21/14 0725  GLUCAP 111* 174* 132*     3.HTN - continue ACE-Norvasc.    4.GERD - PPI    5.Low Mag - replaced     Code Status: Full  Family Communication: none present  Disposition Plan: Home   Procedures CT head, MRI Brain, Echo-Carotids   Consults  Neuro   Medications  Scheduled Meds: . amLODipine  5 mg Oral Daily  . aspirin EC  81 mg Oral Daily  . calcium carbonate  1 tablet Oral Q breakfast  . enoxaparin (LOVENOX) injection  40 mg Subcutaneous Q24H  . glimepiride  4 mg Oral BID  . insulin aspart  0-9 Units Subcutaneous TID WC  . levETIRAcetam  500 mg Intravenous Q12H  . magnesium sulfate 1 - 4 g bolus IVPB  2 g Intravenous Once  . pantoprazole  40 mg Oral Daily  . ramipril  10 mg Oral Daily   Continuous Infusions:  PRN Meds:.HYDROcodone-acetaminophen, senna-docusate  DVT Prophylaxis  Lovenox   Lab Results  Component Value Date   PLT 222 08/20/2014     Antibiotics    Anti-infectives    None          Objective:   Filed Vitals:   08/21/14 0200 08/21/14 0400 08/21/14 0600 08/21/14 0800  BP: 140/60 136/70 139/65 164/63  Pulse: 74 72 73 78  Temp: 98.2 F (36.8 C) 97.7 F (36.5 C) 97.7 F (36.5 C) 97.2 F (36.2 C)  TempSrc: Oral Oral Oral Axillary  Resp: 16 16 20 20   Height:      Weight:      SpO2: 93% 92% 95% 95%    Wt Readings from Last 3 Encounters:  08/20/14 73.846 kg (162 lb 12.8 oz)  06/12/12 71.668 kg (158 lb)  10/25/11 68.221 kg (150 lb 6.4 oz)    No intake or output data in the 24 hours ending 08/21/14 0947   Physical Exam  Awake Alert, Oriented X 3, No new F.N deficits, Normal affect [place].AT,PERRAL Supple Neck,No JVD, No cervical lymphadenopathy appriciated.  Symmetrical Chest wall movement, Good air movement bilaterally, CTAB RRR,No Gallops,Rubs or new Murmurs, No Parasternal Heave +ve B.Sounds, Abd Soft, No tenderness, No organomegaly appriciated, No rebound - guarding or rigidity. No Cyanosis, Clubbing or edema, No new Rash or bruise  Data Review   Micro Results No results found for this or any previous visit (from the past 240 hour(s)).  Radiology Reports Ct Head (brain) Wo Contrast  08/20/2014   CLINICAL DATA:  Code stroke. Slurred speech. Altered mental status. Incontinence. Loss of bowel function. Initial encounter. Altered mental status.  EXAM: CT HEAD WITHOUT CONTRAST  TECHNIQUE: Contiguous axial images were obtained from the base of the skull through the vertex without intravenous contrast.  COMPARISON:  02/24/2013.  FINDINGS: No mass lesion, mass effect, midline shift, hydrocephalus, hemorrhage. No acute territorial cortical ischemia/infarct. Atrophy and chronic ischemic white matter disease is present.  Visible mastoid air cells appear clear. Intracranial atherosclerosis is present. Gray-white differentiation appears normal and symmetric. Unchanged dural calcification over the LEFT  tentorium. RIGHT maxillary mucosal thickening is present. Chronic partial LEFT sphenoid sinus opacification with calcifications. Calcification of the transverse ligament of the atlas incidentally noted.  IMPRESSION: 1. Age expected atrophy and chronic ischemic white matter disease without acute intracranial abnormality. 2. Critical Value/emergent results were called by telephone at the time of interpretation on 08/20/2014 at 1:11 pm to Dr. Nicole Kindred , who verbally acknowledged these results.   Electronically Signed   By: Dereck Ligas M.D.   On: 08/20/2014 13:12     CBC  Recent Labs Lab 08/20/14 1300  WBC 10.4  HGB 12.7  HCT 37.2  PLT 222  MCV 87.9  MCH 30.0  MCHC 34.1  RDW 14.1  LYMPHSABS 2.8  MONOABS 0.7  EOSABS 0.4  BASOSABS 0.1    Chemistries   Recent Labs Lab 08/20/14 1300 08/20/14 1324  NA 141  --   K 4.2  --   CL 102  --   CO2 22  --   GLUCOSE 109*  --   BUN 15  --   CREATININE 0.74  --   CALCIUM 9.7  --   MG  --  1.3*  AST 38*  --   ALT 28  --   ALKPHOS 59  --   BILITOT 0.4  --    ------------------------------------------------------------------------------------------------------------------ estimated creatinine clearance is 52.4 mL/min (by C-G formula based on Cr of 0.74). ------------------------------------------------------------------------------------------------------------------ No results for input(s): HGBA1C in the last 72 hours. ------------------------------------------------------------------------------------------------------------------  Recent Labs  08/21/14 0531  CHOL 204*  HDL 41  LDLCALC 103*  TRIG 302*  CHOLHDL 5.0   ------------------------------------------------------------------------------------------------------------------ No results for input(s): TSH, T4TOTAL, T3FREE, THYROIDAB in the last 72 hours.  Invalid input(s):  FREET3 ------------------------------------------------------------------------------------------------------------------ No results for input(s): VITAMINB12, FOLATE, FERRITIN, TIBC, IRON, RETICCTPCT in the last 72 hours.  Coagulation profile  Recent Labs Lab 08/20/14 1300  INR 1.03    No results for input(s): DDIMER in the last 72 hours.  Cardiac Enzymes No results for input(s): CKMB, TROPONINI, MYOGLOBIN in the last 168 hours.  Invalid input(s): CK ------------------------------------------------------------------------------------------------------------------ Invalid input(s): POCBNP     Time Spent in minutes   35   Leonette Tischer K M.D on 08/21/2014 at 9:47 AM  Between 7am to 7pm - Pager - [phone]  After 7pm go to [email or web address] - password [email or web address]  And look for the night coverage person covering for me after hours  [facility] Office  [phone]

## 2014-08-21 NOTE — Plan of Care (Signed)
Problem: Acute Treatment Outcomes Goal: Neuro exam at baseline or improved Outcome: Progressing Goal: BP within ordered parameters Outcome: Completed/Met Date Met:  08/21/14 Goal: Airway maintained/protected Outcome: Completed/Met Date Met:  08/21/14 Goal: 02 Sats > 94% Outcome: Completed/Met Date Met:  08/21/14 Goal: Hemodynamically stable Outcome: Completed/Met Date Met:  08/21/14 Goal: Prognosis discussed with family/patient as appropriate Outcome: Progressing

## 2014-08-21 NOTE — Procedures (Signed)
ELECTROENCEPHALOGRAM REPORT  Patient: [name]       Room #: 9J24 EEG No. ID: [id number] Age: 80 y.o.        Sex: female Referring Physician: Ronnie Derby Report Date:  08/21/2014        Interpreting Physician: Anthony Sar  History: [name] is an 80 y.o. female who presented following acute onset of confusion, dizziness and bowel incontinence, likely a manifestation of complex partial seizure.  Indications for study:  Rule out focal seizure activity.  Technique: This is an 18 channel routine scalp EEG performed at the bedside with bipolar and monopolar montages arranged in accordance to the international 10/20 system of electrode placement.   Description: This EEG recording was performed during wakefulness and during sleep. Dominant background activity consisted of diffuse low amplitude beta activity with minimal posterior alpha activity during wakefulness. Photic stimulation and hyperventilation were not performed. There was slowing of background activity diffusely during sleep with mixed irregular delta and theta activity. Symmetrical sleep spindles are recorded during stage II sleep. No epileptiform discharges were recorded during wakefulness nor during sleep. There was no abnormal slowing of cerebral activity.  Interpretation: This is a normal EEG recording during wakefulness and during sleep.   Rush Farmer M.D. [facility] [phone]

## 2014-08-21 NOTE — Evaluation (Signed)
Speech Language Pathology Evaluation Patient Details Name: [name] MRN: [id number] DOB: July 16, 1928 Today's Date: 08/21/2014 Time: 4098-1191 SLP Time Calculation (min): 14 min  Problem List:  Patient Active Problem List   Diagnosis Date Noted  . Altered mental status 08/20/2014  . Slurred speech 08/20/2014  . PE (pulmonary embolism) 08/25/2011  . Spinal stenosis of lumbar region at multiple levels 08/23/2011  . Diabetes mellitus 08/23/2011  . Shingles   . Esophageal reflux 07/25/2011  . Unspecified constipation 07/25/2011   Past Medical History:  Past Medical History  Diagnosis Date  . Osteoarthritis   . Degenerative joint disease   . Lumbar spinal stenosis   . Diabetes mellitus, type 2   . Hypertension   . Gout   . GERD (gastroesophageal reflux disease)   . Fundic gland polyps of stomach, benign   . Helicobacter pylori gastritis   . Shingles     current shingles- x4 yrs., on L side of face     . Cancer     melanoma- R arm , area excised   . Tubular adenoma of colon 07/2011  . Hiatal hernia    Past Surgical History:  Past Surgical History  Procedure Laterality Date  . Total knee arthroplasty      left  . Lumbar laminectomy      6 prior back surgery   . Cholecystectomy  1998  . Vaginal hysterectomy  1990's  . Cataract extraction      bilateral, /w IOL  . Retinal laser procedure      bilateral  . Rotator cuff repair      bilateral  . Carpal tunnel release      bilateral  . Hemorrhoid surgery      x 2  . Tonsillectomy    . Benign br. biopsy- many yrs. ago    . Spinal fusion  08/22/2011    Procedure: FUSION POSTERIOR SPINAL MULTILEVEL/SCOLIOSIS;  Surgeon: Gunnar Bulla;  Location: [facility];  Service: Orthopedics;  Laterality: N/A;  LUMBAR LAMINECTOMY, EXTENSION OF POSTERIOR LUMBAR FUSION TO T10 WITH K2M RODS, SCREWS, CONNECTORS, HOOKS, ILIAC CREST BONE GRAFT   HPI:  [name] is an 80 y.o. female who was noted by sister to be not acting right on  the way to [facility]. She states she was picking through her purse and her speech was nonsensical. When they arrived at [facility] she remained in a confused state then proceeded to say she had to go to the bathroom. While in the ladies room she had defecated on herself and called for her sisters assistance. Her sister felt she was significantly wrse, talking nonsensically and not following commands well. Fire dept happened to be in facility and told her to bring her to the hospital On arrival patient remains slow with mentation but showing no focal deficits. MD feels pt has had a seizure.    Assessment / Plan / Recommendation Clinical Impression  Pt demonstrates slow processing with more complex verbal reasoning and problem solving. She is easily frustrated and defensive, masking deficits. Discussed purposed of assessment with pt and sister and encouraged f/u after d/c if cognitive deficits persist. Would recommend outpatient SLP. Will f/u acutely during admission.     SLP Assessment  Patient needs continued Speech Lanaguage Pathology Services    Follow Up Recommendations  Outpatient SLP    Frequency and Duration min 1 x/week  2 weeks   Pertinent Vitals/Pain Pain Assessment: No/denies pain   SLP Goals  Potential to Achieve  Goals: Good Potential Considerations: Cooperation/participation level  SLP Evaluation Prior Functioning  Cognitive/Linguistic Baseline: Within functional limits  Lives With: Alone Available Help at Discharge: Family   Cognition  Overall Cognitive Status: Impaired/Different from baseline Arousal/Alertness: Awake/alert Orientation Level: Oriented X4 Attention: Focused;Sustained Focused Attention: Appears intact Sustained Attention: Appears intact Memory: Appears intact Awareness: Appears intact Problem Solving: Impaired Problem Solving Impairment: Verbal complex Executive Function: Reasoning Reasoning: Impaired Reasoning Impairment: Verbal complex Behaviors:  Poor frustration tolerance Safety/Judgment: Appears intact    Comprehension  Auditory Comprehension Overall Auditory Comprehension: Appears within functional limits for tasks assessed    Expression Verbal Expression Overall Verbal Expression: Appears within functional limits for tasks assessed   Oral / Motor Oral Motor/Sensory Function Overall Oral Motor/Sensory Function: Appears within functional limits for tasks assessed Motor Speech Overall Motor Speech: Appears within functional limits for tasks assessed   GO    Herbie Baltimore, MA CCC-SLP [phone]  Lynann Beaver 08/21/2014, 1:18 PM

## 2014-08-21 NOTE — Progress Notes (Signed)
Occupational Therapy Evaluation Patient Details Name: SAMANVITHA GERMANY MRN: [id number] DOB: 29-Jan-1928 Today's Date: 08/21/2014    History of Present Illness Patient is a 80 y/o female presents with slurred speech and AMS. On the day of admission, pt was with her sister at [facility] for grocery shopping, when she noticed nonsensical speech. While in the ladies room, pt noticed that she had defecated on herself and called sister for assistance. Patient's sister noticed that she was progressively worsening and still had nonsensical speech so called EMS. Head CT- (-). EEG- shows no epileptiform activity.  Per MD, likely new onset seizure. MRI pending.    Clinical Impression   PTA pt lived at home alone and reports independence with ADLs and IADLs. Pt is irritable and rolls her eyes at therapist, and was also noncooperative with PT earlier. Pt demonstrates decreased cognition and impaired safety awareness. Pt is perseverating on returning home, but would benefit from acute OT to address cognition and safety with ADLs.     Follow Up Recommendations  Home health OT;Supervision/Assistance - 24 hour (pending progress)   Equipment Recommendations  None recommended by OT    Recommendations for Other Services       Precautions / Restrictions Precautions Precautions: Fall Restrictions Weight Bearing Restrictions: No      Mobility Bed Mobility Overal bed mobility: Needs Assistance Bed Mobility: Supine to Sit     Supine to sit: Supervision     General bed mobility comments: Supervision due to impulsivity.  Transfers Overall transfer level: Needs assistance Equipment used: None Transfers: Sit to/from Stand Sit to Stand: Min guard         General transfer comment: Min guard for safety due to impulsivity and unsteadiness.      Balance Overall balance assessment: Needs assistance Sitting-balance support: No upper extremity supported;Feet supported Sitting balance-Leahy Scale: Good      Standing balance support: No upper extremity supported;During functional activity Standing balance-Leahy Scale: Fair Standing balance comment: Able to perform dynamic standing activities without UE support - donning underwear, washing hands. Mild drifting noted.                            ADL Overall ADL's : Needs assistance/impaired Eating/Feeding: Independent;Sitting   Grooming: Min guard;Standing;Wash/dry hands   Upper Body Bathing: Set up;Sitting;Cueing for safety   Lower Body Bathing: Min guard;Sit to/from stand;Cueing for safety   Upper Body Dressing : Set up;Sitting;Cueing for safety   Lower Body Dressing: Min guard;Sit to/from stand;Cueing for safety   Toilet Transfer: Min guard;Ambulation;Comfort height toilet   Toileting- Clothing Manipulation and Hygiene: Min guard;Sit to/from stand       Functional mobility during ADLs: Min guard General ADL Comments: Pt impulsive and irritated, then later apologized for being "ill." Pt appears to have cognitive deficits, however PT note reports that sister states pt appears close to cognitive baseline. Pt requires min guard for safety due to unsteadiness during ambulation and impulsivity. Pt perseverating on going home.      Vision                 Additional Comments: Pt reports no change from baseline, and states that her vision is poor but would not participate in vision screen and rolled her eyes often at OT.    Perception Perception Perception Tested?: No   Praxis      Pertinent Vitals/Pain Pain Assessment: No/denies pain     Hand Dominance Right  Extremity/Trunk Assessment Upper Extremity Assessment Upper Extremity Assessment: Overall WFL for tasks assessed   Lower Extremity Assessment Lower Extremity Assessment: Defer to PT evaluation RLE Deficits / Details: Numbness in right toes.   Cervical / Trunk Assessment Cervical / Trunk Assessment: Normal   Communication  Communication Communication: No difficulties   Cognition Arousal/Alertness: Awake/alert Behavior During Therapy: Impulsive (irritated, rolling eyes, then apologized for being "ill") Overall Cognitive Status: Impaired/Different from baseline Area of Impairment: Following commands;Memory;Safety/judgement;Problem solving;Awareness     Memory: Decreased short-term memory Following Commands: Follows multi-step commands with increased time;Follows multi-step commands inconsistently Safety/Judgement: Decreased awareness of safety;Decreased awareness of deficits Awareness: Emergent Problem Solving: Slow processing;Requires verbal cues General Comments: No family in room when OT was present. Pt irritated and rolling eyes. She appeared to have difficulty understanding directions at times.    General Comments    Pt not very cooperative asking "now what do you want me to do?"            Home Living Family/patient expects to be discharged to:: Private residence Living Arrangements: Alone Available Help at Discharge: Family;Available PRN/intermittently Type of Home: House Home Access: Stairs to enter [employer] of Steps: 4-5 Entrance Stairs-Rails: Right Home Layout: One level     Bathroom Shower/Tub: Teacher, early years/pre: Standard     Home Equipment: Environmental consultant - 2 wheels;Bedside commode;Cane - single point      Lives With: Alone    Prior Functioning/Environment Level of Independence: Independent        Comments: Per PT note: Pt is (I) for ADLs and IADLs. Sister drives and assists with grocery shopping. No falls reported recently per pt report.    OT Diagnosis: Cognitive deficits   OT Problem List: Impaired balance (sitting and/or standing);Decreased cognition;Decreased safety awareness   OT Treatment/Interventions: Self-care/ADL training;Energy conservation;DME and/or AE instruction;Therapeutic activities;Cognitive remediation/compensation;Patient/family  education;Balance training    OT Goals(Current goals can be found in the care plan section) Acute Rehab OT Goals Patient Stated Goal: to go home and sleep in my bed OT Goal Formulation: With patient Time For Goal Achievement: 09/04/14 Potential to Achieve Goals: Good ADL Goals Pt Will Perform Grooming: with modified independence;standing Pt Will Perform Lower Body Bathing: with modified independence;sit to/from stand Pt Will Perform Lower Body Dressing: with modified independence;sit to/from stand Pt Will Transfer to Toilet: with modified independence;ambulating Additional ADL Goal #1: Pt will attend to and complete 3 ADL tasks safely without VC's to increase safety at home.  OT Frequency: Min 2X/week   Barriers to D/C: Decreased caregiver support             End of Session Nurse Communication: Other (comment) (pt sitting in recliner with alarm on)  Activity Tolerance: Patient tolerated treatment well Patient left: in chair;with call bell/phone within reach;with chair alarm set   Time: 1557-1620 OT Time Calculation (min): 23 min Charges:  OT General Charges $OT Visit: 1 Procedure OT Evaluation $Initial OT Evaluation Tier I: 1 Procedure OT Treatments $Self Care/Home Management : 8-22 mins  Villa Herb M 08/21/2014, 5:38 PM   Secundino Ginger Lynetta Mare, OTR/L Occupational Therapist [phone] (pager)

## 2014-08-21 NOTE — Evaluation (Signed)
Physical Therapy Evaluation Patient Details Name: [name] MRN: [id number] DOB: [birth year] Today's Date: 08/21/2014   History of Present Illness  Patient is a 80 y/o female presents with slurred speech and AMS. On the day of admission, pt was with her sister at [facility] for grocery shopping, when she noticed nonsensical speech. While in the ladies room, pt noticed that she had defecated on herself and called sister for assistance. Patient's sister noticed that she was progressively worsening and still had nonsensical speech so called EMS. Head CT- (-). EEG- shows no epileptiform activity.  Per MD, likely new onset seizure. MRI pending.     Clinical Impression  Patient presents with functional limitations due to deficits listed in PT problem list (see below). Pt with balance deficits and impaired safety awareness limiting safe mobility. May benefit from use of AD for safety/support. Pt very upset and irritated during PT evaluation. Will need to assess ability to negotiate steps prior to discharge. Concerned as pt lives alone. Pt would benefit from acute PT to improve gait, balance and overall safe mobility so pt can maximize independence and return to PLOF.    Follow Up Recommendations Supervision/Assistance - 24 hour (pending improvement.)    Equipment Recommendations  None recommended by PT    Recommendations for Other Services OT consult     Precautions / Restrictions Precautions Precautions: Fall Restrictions Weight Bearing Restrictions: No      Mobility  Bed Mobility Overal bed mobility: Needs Assistance Bed Mobility: Supine to Sit     Supine to sit: HOB elevated;Supervision     General bed mobility comments: Supervision due to impulsivity.  Transfers Overall transfer level: Needs assistance Equipment used: None Transfers: Sit to/from Stand Sit to Stand: Min guard         General transfer comment: Min guard for safety due to impulsivity. Stood from [employer],  from toilet x1.  Ambulation/Gait Ambulation/Gait assistance: Min assist Ambulation Distance (Feet): 150 Feet Assistive device: None Gait Pattern/deviations: Step-through pattern;Decreased stride length;Trunk flexed;Shuffle Gait velocity: increased   General Gait Details: Pt with forward flexed posture resulting in increased gait speed and forward momentum. Unsteady. Impulsive with poor safety awareness. Initially pt pushing IV pole however progressed to no support per therapist request.   Stairs            Wheelchair Mobility    Modified Rankin (Stroke Patients Only)       Balance Overall balance assessment: Needs assistance Sitting-balance support: Feet supported;No upper extremity supported Sitting balance-Leahy Scale: Good     Standing balance support: During functional activity Standing balance-Leahy Scale: Fair Standing balance comment: Able to perform dynamic standing activities without UE support - donning underwear, washing hands. Mild drifting noted.                             Pertinent Vitals/Pain Pain Assessment: No/denies pain    Home Living Family/patient expects to be discharged to:: Private residence Living Arrangements: Alone Available Help at Discharge: Family;Available PRN/intermittently Type of Home: House Home Access: Stairs to enter Entrance Stairs-Rails: Right Entrance Stairs-Number of Steps: 4-5 Home Layout: One level Home Equipment: Walker - 2 wheels;Bedside commode;Cane - single point      Prior Function Level of Independence: Independent         Comments: Pt is (I) for ADLs and IADLs. Sister drives and assists with grocery shopping. No falls reported recently per pt report.     Hand  Dominance        Extremity/Trunk Assessment   Upper Extremity Assessment: Defer to OT evaluation           Lower Extremity Assessment: Overall WFL for tasks assessed;RLE deficits/detail RLE Deficits / Details: Numbness in  right toes.       Communication   Communication: No difficulties  Cognition Arousal/Alertness: Awake/alert Behavior During Therapy: Agitated;Impulsive (irritated and agitated about not having gone down for MRI yet.) Overall Cognitive Status: Impaired/Different from baseline Area of Impairment: Following commands;Memory;Safety/judgement;Problem solving     Memory: Decreased short-term memory Following Commands: Follows multi-step commands with increased time;Follows multi-step commands inconsistently Safety/Judgement: Decreased awareness of safety;Decreased awareness of deficits   Problem Solving: Slow processing;Requires verbal cues General Comments: Pt's sister reports she seems close to baseline cognitively. Difficult to assess as pt very irritated. Pt not able to remember events leading to hospitalization.     General Comments General comments (skin integrity, edema, etc.): Pt not very cooperative during PT evaluation - very irritated and agitated about being in the hospital, "If you really wanted to know if I was okay, they would have done the MRI already."     Exercises        Assessment/Plan    PT Assessment Patient needs continued PT services  PT Diagnosis Abnormality of gait   PT Problem List Decreased cognition;Impaired sensation;Decreased balance;Decreased safety awareness;Decreased activity tolerance  PT Treatment Interventions Balance training;Gait training;DME instruction;Stair training;Cognitive remediation;Patient/family education;Functional mobility training;Therapeutic activities;Therapeutic exercise;Neuromuscular re-education   PT Goals (Current goals can be found in the Care Plan section) Acute Rehab PT Goals Patient Stated Goal: to get my MRI done and go home PT Goal Formulation: With patient Time For Goal Achievement: 09/04/14 Potential to Achieve Goals: Fair    Frequency Min 4X/week   Barriers to discharge Decreased caregiver support Pt lives alone     Co-evaluation               End of Session Equipment Utilized During Treatment: Gait belt Activity Tolerance: Other (comment) (Limited due to irritation.) Patient left: in bed;with call bell/phone within reach;with family/visitor present;with bed alarm set Nurse Communication: Mobility status;Precautions    Functional Assessment Tool Used: Clinical judgment Functional Limitation: Mobility: Walking and moving around Mobility: Walking and Moving Around Current Status ([id number]): At least 20 percent but less than 40 percent impaired, limited or restricted Mobility: Walking and Moving Around Goal Status [phone]): At least 1 percent but less than 20 percent impaired, limited or restricted    Time: 1320-1340 PT Time Calculation (min): 20 min   Charges:   PT Evaluation $Initial PT Evaluation Tier I: 1 Procedure PT Treatments $Gait Training: 8-22 mins   PT G Codes:   Functional Assessment Tool Used: Clinical judgment Functional Limitation: Mobility: Walking and moving around    [place], [place] 08/21/2014, 1:55 PM  Candy Sledge, PT, DPT [phone]

## 2014-08-21 NOTE — Progress Notes (Signed)
Subjective: Patient is amnestic of event yesterday.  She now feels back to her baseline but has many questions about how she could have had a seizure.   Objective: Current vital signs: BP 164/63 mmHg  Pulse 78  Temp(Src) 97.2 F (36.2 C) (Axillary)  Resp 20  Ht 5\' 4"  (1.626 m)  Wt 73.846 kg (162 lb 12.8 oz)  BMI 27.93 kg/m2  SpO2 95% Vital signs in last 24 hours: Temp:  [97.2 F (36.2 C)-98.6 F (37 C)] 97.2 F (36.2 C) (11/06 0800) Pulse Rate:  [72-88] 78 (11/06 0800) Resp:  [14-25] 20 (11/06 0800) BP: (106-164)/(51-92) 164/63 mmHg (11/06 0800) SpO2:  [90 %-99 %] 95 % (11/06 0800) Weight:  [68.04 kg (150 lb)-73.846 kg (162 lb 12.8 oz)] 73.846 kg (162 lb 12.8 oz) (11/05 1302)  Intake/Output from previous day:   Intake/Output this shift:   Nutritional status: Diet heart healthy/carb modified  Neurologic Exam: General: NAD Mental Status: Alert, oriented, thought content appropriate.  Speech fluent without evidence of aphasia.  Able to follow 3 step commands without difficulty. Cranial Nerves: II:  Visual fields grossly normal, pupils equal, round, reactive to light and accommodation III,IV, VI: ptosis not present, extra-ocular motions intact bilaterally V,VII: smile symmetric, facial light touch sensation normal bilaterally VIII: hearing normal bilaterally IX,X: gag reflex present XI: bilateral shoulder shrug XII: midline tongue extension without atrophy or fasciculations  Motor: Right : Upper extremity   5/5    Left:     Upper extremity   5/5  Lower extremity   5/5     Lower extremity   5/5 Tone and bulk:normal tone throughout; no atrophy noted Sensory: Pinprick and light touch intact throughout, bilaterally Deep Tendon Reflexes:  1+ throughout UE and in KJ, no AJ  Plantars: Right: downgoing   Left: downgoing Cerebellar: normal finger-to-nose,  normal heel-to-shin test    Lab Results: Basic Metabolic Panel:  Recent Labs Lab 08/20/14 1300 08/20/14 1324   NA 141  --   K 4.2  --   CL 102  --   CO2 22  --   GLUCOSE 109*  --   BUN 15  --   CREATININE 0.74  --   CALCIUM 9.7  --   MG  --  1.3*  PHOS  --  3.5    Liver Function Tests:  Recent Labs Lab 08/20/14 1300  AST 38*  ALT 28  ALKPHOS 59  BILITOT 0.4  PROT 6.8  ALBUMIN 3.9   No results for input(s): LIPASE, AMYLASE in the last 168 hours. No results for input(s): AMMONIA in the last 168 hours.  CBC:  Recent Labs Lab 08/20/14 1300  WBC 10.4  NEUTROABS 6.4  HGB 12.7  HCT 37.2  MCV 87.9  PLT 222    Cardiac Enzymes: No results for input(s): CKTOTAL, CKMB, CKMBINDEX, TROPONINI in the last 168 hours.  Lipid Panel:  Recent Labs Lab 08/21/14 0531  CHOL 204*  TRIG 302*  HDL 41  CHOLHDL 5.0  VLDL 60*  LDLCALC 103*    CBG:  Recent Labs Lab 08/20/14 1300 08/20/14 2240 08/21/14 0725  GLUCAP 111* 174* 132*    Microbiology: Results for orders placed or performed in visit on 11/28/11  Clostridium Difficile by PCR     Status: None   Collection Time: 11/28/11 11:50 AM  Result Value Ref Range Status   C difficile by pcr Not Detected Not Detected Final    Comment:   This assay detects the presence of Clostridium  difficile DNA coding for toxin B (tcdB) by real-time polymerase chain reaction (PCR) amplification. This test was developed and its performance characteristics have been determined by [facility]. Performance characteristics refer to the analytical performance of the test. This test has not been cleared or approved by the [place] Food and Drug Administration. The FDA has determined that such clearance or approval is not necessary. This laboratory is certified under the [place] as qualified to perform high complexity clinical laboratory testing.    Coagulation Studies:  Recent Labs  08/20/14 1300  LABPROT 13.6  INR 1.03    Imaging: Ct Head (brain) Wo Contrast  08/20/2014   CLINICAL DATA:   Code stroke. Slurred speech. Altered mental status. Incontinence. Loss of bowel function. Initial encounter. Altered mental status.  EXAM: CT HEAD WITHOUT CONTRAST  TECHNIQUE: Contiguous axial images were obtained from the base of the skull through the vertex without intravenous contrast.  COMPARISON:  02/24/2013.  FINDINGS: No mass lesion, mass effect, midline shift, hydrocephalus, hemorrhage. No acute territorial cortical ischemia/infarct. Atrophy and chronic ischemic white matter disease is present.  Visible mastoid air cells appear clear. Intracranial atherosclerosis is present. Gray-white differentiation appears normal and symmetric. Unchanged dural calcification over the LEFT tentorium. RIGHT maxillary mucosal thickening is present. Chronic partial LEFT sphenoid sinus opacification with calcifications. Calcification of the transverse ligament of the atlas incidentally noted.  IMPRESSION: 1. Age expected atrophy and chronic ischemic white matter disease without acute intracranial abnormality. 2. Critical Value/emergent results were called by telephone at the time of interpretation on 08/20/2014 at 1:11 pm to Dr. Nicole Kindred , who verbally acknowledged these results.   Electronically Signed   By: Dereck Ligas M.D.   On: 08/20/2014 13:12    Medications:  Scheduled: . amLODipine  5 mg Oral Daily  . aspirin EC  81 mg Oral Daily  . atorvastatin  10 mg Oral q1800  . calcium carbonate  1 tablet Oral Q breakfast  . enoxaparin (LOVENOX) injection  40 mg Subcutaneous Q24H  . glimepiride  4 mg Oral BID  . insulin aspart  0-9 Units Subcutaneous TID WC  . levETIRAcetam  500 mg Intravenous Q12H  . magnesium sulfate 1 - 4 g bolus IVPB  2 g Intravenous Once  . pantoprazole  40 mg Oral Daily  . ramipril  10 mg Oral Daily   EEG: Interpretation: This is a normal EEG recording during wakefulness and during sleep.  Assessment/Plan:  Likely new onset seizure.   EEG shows no epileptiform activity.   Awaiting  MRI brain  Recommend: 1) Continue Keppra 500 mg BID  2) Will need out patient neurology follow up on Discharge 3) will follow MRI results     Etta Quill PA-C [facility] [phone]  08/21/2014, 10:28 AM

## 2014-08-22 LAB — GLUCOSE, CAPILLARY
GLUCOSE-CAPILLARY: 154 mg/dL — AB (ref 70–99)
GLUCOSE-CAPILLARY: 197 mg/dL — AB (ref 70–99)

## 2014-08-22 LAB — MAGNESIUM: Magnesium: 1.7 mg/dL (ref 1.5–2.5)

## 2014-08-22 MED ORDER — LEVETIRACETAM 500 MG PO TABS: 500.0000 mg | ORAL_TABLET | Freq: Two times a day (BID) | ORAL | Status: DC

## 2014-08-22 MED ORDER — ATORVASTATIN CALCIUM 10 MG PO TABS: 10.0000 mg | ORAL_TABLET | Freq: Every day | ORAL | Status: DC

## 2014-08-22 NOTE — Progress Notes (Signed)
Physical Therapy Treatment Patient Details Name: [name] MRN: [id number] DOB: 05-24-1928 Today's Date: 08/22/2014    History of Present Illness Patient is a 80 y/o female presents with slurred speech and AMS. On the day of admission, pt was with her sister at [facility] for grocery shopping, when she noticed nonsensical speech. While in the ladies room, pt noticed that she had defecated on herself and called sister for assistance. Patient's sister noticed that she was progressively worsening and still had nonsensical speech so called EMS. Head CT- (-). EEG- shows no epileptiform activity.  Per MD, likely new onset seizure. MRI pending.     PT Comments    Patient able to progress this session with mobility and safety. Patient still with some impulsivity but appears to be improving. Able to practice some steps as well today. Patient voicing concerns about the ongoings in her life causing the stress that caused this event. Son came in at end of session and stated that patient will be staying with her sister or the sister will be stayin with her. Continue to recommend 24/7 supervision for patients safety.   Follow Up Recommendations  Supervision/Assistance - 24 hour     Equipment Recommendations  None recommended by PT    Recommendations for Other Services       Precautions / Restrictions Precautions Precautions: Fall Restrictions Weight Bearing Restrictions: No    Mobility  Bed Mobility Overal bed mobility: Needs Assistance Bed Mobility: Supine to Sit     Supine to sit: Supervision        Transfers Overall transfer level: Needs assistance Equipment used: None Transfers: Sit to/from Stand Sit to Stand: Supervision         General transfer comment: Supervision for safety  Ambulation/Gait Ambulation/Gait assistance: Min guard Ambulation Distance (Feet): 400 Feet Assistive device: None Gait Pattern/deviations: Step-through pattern;Decreased stride length Gait  velocity: increased   General Gait Details: Continued with forwards flexed posture and increased gait speed but no LOB. HR into the 140s with gait so required seated rest break.    Stairs Stairs: Yes Stairs assistance: Min guard Stair Management: Step to pattern;Forwards;One rail Right Number of Stairs: 3 General stair comments: Cues for safety and technique. Step to pattern taught for increased balance  Wheelchair Mobility    Modified Rankin (Stroke Patients Only)       Balance                                    Cognition Arousal/Alertness: Awake/alert Behavior During Therapy: Impulsive Overall Cognitive Status: Impaired/Different from baseline Area of Impairment: Following commands;Memory;Safety/judgement     Memory: Decreased short-term memory Following Commands: Follows multi-step commands with increased time;Follows multi-step commands inconsistently Safety/Judgement: Decreased awareness of safety;Decreased awareness of deficits   Problem Solving: Slow processing;Requires verbal cues      Exercises      General Comments        Pertinent Vitals/Pain Pain Assessment: No/denies pain    Home Living                      Prior Function            PT Goals (current goals can now be found in the care plan section) Progress towards PT goals: Progressing toward goals    Frequency  Min 4X/week    PT Plan Current plan remains appropriate    Co-evaluation  End of Session Equipment Utilized During Treatment: Gait belt Activity Tolerance: Patient tolerated treatment well Patient left: in chair;with call bell/phone within reach;with chair alarm set;with family/visitor present     Time: 1884-1660 PT Time Calculation (min): 25 min  Charges:  $Gait Training: 8-22 mins $Therapeutic Activity: 8-22 mins                    G Codes:      Jacqualyn Posey 08/22/2014, 11:56 AM 08/22/2014 Jacqualyn Posey PTA [phone] pager [phone] office

## 2014-08-22 NOTE — Plan of Care (Signed)
Problem: Progression Outcomes Goal: Pain controlled Outcome: Completed/Met Date Met:  08/22/14 Goal: Bowel & Bladder Continence Outcome: Completed/Met Date Met:  08/22/14

## 2014-08-22 NOTE — Care Management Note (Addendum)
CARE MANAGEMENT NOTE 08/22/2014  Patient:  [name],[name]   Account Number:  [id number]  Date Initiated:  08/22/2014  Documentation initiated by:  Oliveras-Aizpurua,Jeannette  Subjective/Objective Assessment:   80 yo admitted with slurred speech and confusion. CT showed no acute abnormalities.     Action/Plan:   Met with pt and sister. She plans to return home by herself. Her sister lives 3 miles away and she has 2 sons who live in [place].   Anticipated DC Date:  08/22/2014   Anticipated DC Plan:  HOME W HOME HEALTH SERVICES      DC Planning Services  CM consult      PAC Choice  HOME HEALTH   Choice offered to / List presented to:  C-1 Patient        HH arranged  HH-2 PT  HH-3 OT  HH-4 NURSE'S AIDE      HH agency  [facility]   Status of service:  Completed, signed off Medicare Important Message given?   (If response is "NO", the following Medicare IM given date fields will be blank) Date Medicare IM given:   Medicare IM given by:   Date Additional Medicare IM given:   Additional Medicare IM given by:    Discharge Disposition:    Per UR Regulation:    If discussed at Long Length of Stay Meetings, dates discussed:    Comments:  Discussed with pt and sister PT recommendation of 24 hr supervision (pending improvement). She stated that the doctor informed her that she did not have a stroke or a seizure. She is walking and she doesn't use any DME. Pt is alert and oriented x 3. She agreed with HHC order but she feels she doesn't need it. Explained home safety eval by HHPT. She has used [facility] in the past but she doesn't want to use them again. She used another HH agency but she doesn't remember the name. Showed pt a list of HH agencies. She agreed to use [facility] for referral. Contacted Miranda at [facility] for referral.  

## 2014-08-22 NOTE — Discharge Instructions (Signed)
Follow with Primary MD Jani Gravel, MD in 7 days   Get CBC, CMP, 2 view Chest X ray checked  by Primary MD next visit.    Do not drive, operating heavy machinery, perform activities at heights, swimming or participation in water activities or provide baby sitting services until you have seen by Primary MD or a Neurologist and advised to do so again.  Activity: As tolerated with Full fall precautions use walker/cane & assistance as needed   Disposition Home     Diet: Heart Healthy Low carb.  For Heart failure patients - Check your Weight same time everyday, if you gain over 2 pounds, or you develop in leg swelling, experience more shortness of breath or chest pain, call your Primary MD immediately. Follow Cardiac Low Salt Diet and 1.8 lit/day fluid restriction.   On your next visit with your primary care physician please Get Medicines reviewed and adjusted.   Please request your Prim.MD to go over all Hospital Tests and Procedure/Radiological results at the follow up, please get all Hospital records sent to your Prim MD by signing hospital release before you go home.   If you experience worsening of your admission symptoms, develop shortness of breath, life threatening emergency, suicidal or homicidal thoughts you must seek medical attention immediately by calling 911 or calling your MD immediately  if symptoms less severe.  You Must read complete instructions/literature along with all the possible adverse reactions/side effects for all the Medicines you take and that have been prescribed to you. Take any new Medicines after you have completely understood and accpet all the possible adverse reactions/side effects.   Do not drive when taking Pain medications.    Do not take more than prescribed Pain, Sleep and Anxiety Medications  Special Instructions: If you have smoked or chewed Tobacco  in the last 2 yrs please stop smoking, stop any regular Alcohol  and or any Recreational drug  use.  Wear Seat belts while driving.   Please note  You were cared for by a hospitalist during your hospital stay. If you have any questions about your discharge medications or the care you received while you were in the hospital after you are discharged, you can call the unit and asked to speak with the hospitalist on call if the hospitalist that took care of you is not available. Once you are discharged, your primary care physician will handle any further medical issues. Please note that NO REFILLS for any discharge medications will be authorized once you are discharged, as it is imperative that you return to your primary care physician (or establish a relationship with a primary care physician if you do not have one) for your aftercare needs so that they can reassess your need for medications and monitor your lab values.  Transient Ischemic Attack A transient ischemic attack (TIA) is a "warning stroke" that causes stroke-like symptoms. Unlike a stroke, a TIA does not cause permanent damage to the brain. The symptoms of a TIA can happen very fast and do not last long. It is important to know the symptoms of a TIA and what to do. This can help prevent a major stroke or death. CAUSES   A TIA is caused by a temporary blockage in an artery in the brain or neck (carotid artery). The blockage does not allow the brain to get the blood supply it needs and can cause different symptoms. The blockage can be caused by either:  A blood clot.  Fatty buildup (  plaque) in a neck or brain artery. RISK FACTORS  High blood pressure (hypertension).  High cholesterol.  Diabetes mellitus.  Heart disease.  The build up of plaque in the blood vessels (peripheral artery disease or atherosclerosis).  The build up of plaque in the blood vessels providing blood and oxygen to the brain (carotid artery stenosis).  An abnormal heart rhythm (atrial fibrillation).  Obesity.  Smoking.  Taking oral  contraceptives (especially in combination with smoking).  Physical inactivity.  A diet high in fats, salt (sodium), and calories.  Alcohol use.  Use of illegal drugs (especially cocaine and methamphetamine).  Being female.  Being African American.  Being over the age of 35.  Family history of stroke.  Previous history of blood clots, stroke, TIA, or heart attack.  Sickle cell disease. SYMPTOMS  TIA symptoms are the same as a stroke but are temporary. These symptoms usually develop suddenly, or may be newly present upon awakening from sleep:  Sudden weakness or numbness of the face, arm, or leg, especially on one side of the body.  Sudden trouble walking or difficulty moving arms or legs.  Sudden confusion.  Sudden personality changes.  Trouble speaking (aphasia) or understanding.  Difficulty swallowing.  Sudden trouble seeing in one or both eyes.  Double vision.  Dizziness.  Loss of balance or coordination.  Sudden severe headache with no known cause.  Trouble reading or writing.  Loss of bowel or bladder control.  Loss of consciousness. DIAGNOSIS  Your caregiver may be able to determine the presence or absence of a TIA based on your symptoms, history, and physical exam. Computed tomography (CT scan) of the brain is usually performed to help identify a TIA. Other tests may be done to diagnose a TIA. These tests may include:  Electrocardiography.  Continuous heart monitoring.  Echocardiography.  Carotid ultrasonography.  Magnetic resonance imaging (MRI).  A scan of the brain circulation.  Blood tests. PREVENTION  The risk of a TIA can be decreased by appropriately treating high blood pressure, high cholesterol, diabetes, heart disease, and obesity and by quitting smoking, limiting alcohol, and staying physically active. TREATMENT  Time is of the essence. Since the symptoms of TIA are the same as a stroke, it is important to seek treatment as soon as  possible because you may need a medicine to dissolve the clot (thrombolytic) that cannot be given if too much time has passed. Treatment options vary. Treatment options may include rest, oxygen, intravenous (IV) fluids, and medicines to thin the blood (anticoagulants). Medicines and diet may be used to address diabetes, high blood pressure, and other risk factors. Measures will be taken to prevent short-term and long-term complications, including infection from breathing foreign material into the lungs (aspiration pneumonia), blood clots in the legs, and falls. Treatment options include procedures to either remove plaque in the carotid arteries or dilate carotid arteries that have narrowed due to plaque. Those procedures are:  Carotid endarterectomy.  Carotid angioplasty and stenting. HOME CARE INSTRUCTIONS   Take all medicines prescribed by your caregiver. Follow the directions carefully. Medicines may be used to control risk factors for a stroke. Be sure you understand all your medicine instructions.  You may be told to take aspirin or the anticoagulant warfarin. Warfarin needs to be taken exactly as instructed.  Taking too much or too little warfarin is dangerous. Too much warfarin increases the risk of bleeding. Too little warfarin continues to allow the risk for blood clots. While taking warfarin, you will need  to have regular blood tests to measure your blood clotting time. A PT blood test measures how long it takes for blood to clot. Your PT is used to calculate another value called an INR. Your PT and INR help your caregiver to adjust your dose of warfarin. The dose can change for many reasons. It is critically important that you take warfarin exactly as prescribed.  Many foods, especially foods high in vitamin K can interfere with warfarin and affect the PT and INR. Foods high in vitamin K include spinach, kale, broccoli, cabbage, collard and turnip greens, brussels sprouts, peas, cauliflower,  seaweed, and parsley as well as beef and pork liver, green tea, and soybean oil. You should eat a consistent amount of foods high in vitamin K. Avoid major changes in your diet, or notify your caregiver before changing your diet. Arrange a visit with a dietitian to answer your questions.  Many medicines can interfere with warfarin and affect the PT and INR. You must tell your caregiver about any and all medicines you take, this includes all vitamins and supplements. Be especially cautious with aspirin and anti-inflammatory medicines. Do not take or discontinue any prescribed or over-the-counter medicine except on the advice of your caregiver or pharmacist.  Warfarin can have side effects, such as excessive bruising or bleeding. You will need to hold pressure over cuts for longer than usual. Your caregiver or pharmacist will discuss other potential side effects.  Avoid sports or activities that may cause injury or bleeding.  Be mindful when shaving, flossing your teeth, or handling sharp objects.  Alcohol can change the body's ability to handle warfarin. It is best to avoid alcoholic drinks or consume only very small amounts while taking warfarin. Notify your caregiver if you change your alcohol intake.  Notify your dentist or other caregivers before procedures.  Eat a diet that includes 5 or more servings of fruits and vegetables each day. This may reduce the risk of stroke. Certain diets may be prescribed to address high blood pressure, high cholesterol, diabetes, or obesity.  A low-sodium, low-saturated fat, low-trans fat, low-cholesterol diet is recommended to manage high blood pressure.  A low-saturated fat, low-trans fat, low-cholesterol, and high-fiber diet may control cholesterol levels.  A controlled-carbohydrate, controlled-sugar diet is recommended to manage diabetes.  A reduced-calorie, low-sodium, low-saturated fat, low-trans fat, low-cholesterol diet is recommended to manage  obesity.  Maintain a healthy weight.  Stay physically active. It is recommended that you get at least 30 minutes of activity on most or all days.  Do not smoke.  Limit alcohol use even if you are not taking warfarin. Moderate alcohol use is considered to be:  No more than 2 drinks each day for men.  No more than 1 drink each day for nonpregnant women.  Stop drug abuse.  Home safety. A safe home environment is important to reduce the risk of falls. Your caregiver may arrange for specialists to evaluate your home. Having grab bars in the bedroom and bathroom is often important. Your caregiver may arrange for equipment to be used at home, such as raised toilets and a seat for the shower.  Follow all instructions for follow-up with your caregiver. This is very important. This includes any referrals and lab tests. Proper follow up can prevent a stroke or another TIA from occurring. SEEK MEDICAL CARE IF:  You have personality changes.  You have difficulty swallowing.  You are seeing double.  You have dizziness.  You have a fever.  You  have skin breakdown. SEEK IMMEDIATE MEDICAL CARE IF:  Any of these symptoms may represent a serious problem that is an emergency. Do not wait to see if the symptoms will go away. Get medical help right away. Call your local emergency services (911 in [place] Do not drive yourself to the hospital.  You have sudden weakness or numbness of the face, arm, or leg, especially on one side of the body.  You have sudden trouble walking or difficulty moving arms or legs.  You have sudden confusion.  You have trouble speaking (aphasia) or understanding.  You have sudden trouble seeing in one or both eyes.  You have a loss of balance or coordination.  You have a sudden, severe headache with no known cause.  You have new chest pain or an irregular heartbeat.  You have a partial or total loss of consciousness. MAKE SURE YOU:   Understand these  instructions.  Will watch your condition.  Will get help right away if you are not doing well or get worse. Document Released: 07/12/2005 Document Revised: 10/07/2013 Document Reviewed: 01/07/2014 [facility] Patient Information 2015 [place], [place]. This information is not intended to replace advice given to you by your health care provider. Make sure you discuss any questions you have with your health care provider. Nonepileptic Seizures Nonepileptic seizures are seizures that are not caused by abnormal electrical signals in your brain. These seizures often seem like epileptic seizures, but they are not caused by epilepsy.  There are two types of nonepileptic seizures:  A physiologic nonepileptic seizure results from a disruption in your brain.  A psychogenic seizure results from emotional stress. These seizures are sometimes called pseudoseizures. CAUSES  Causes of physiologic nonepileptic seizures include:   Sudden drop in blood pressure.  Low blood sugar.  Low levels of salt (sodium) in your blood.  Low levels of calcium in your blood.  Migraine.  Heart rhythm problems.  Sleep disorders.  Drug and alcohol abuse. Common causes of psychogenic nonepileptic seizures include:  Stress.  Emotional trauma.  Sexual or physical abuse.  Major life events, such as divorce or the death of a loved one.  Mental health disorders, including panic attack and hyperactivity disorder. SIGNS AND SYMPTOMS A nonepileptic seizure can look like an epileptic seizure, including uncontrollable shaking (convulsions), or changes in attention, behavior, or the ability to remain awake and alert. However, there are some differences. Nonepileptic seizures usually:  Do not cause physical injuries.  Start slowly.  Include crying or shrieking.  Last longer than 2 minutes.  Have a short recovery time without headache or exhaustion. DIAGNOSIS  Your health care provider can usually diagnose  nonepileptic seizures after taking your medical history and giving you a physical exam. Your health care provider may want to talk to your friends or relatives who have seen you have a seizure.  You may also need to have tests to look for causes of physiologic nonepileptic seizures. This may include an electroencephalogram (EEG), which is a test that measures electrical activity in your brain. If you have had an epileptic seizure, the results of your EEG will be abnormal. If your health care provider thinks you have had a psychogenic nonepileptic seizure, you may need to see a mental health specialist for an evaluation. TREATMENT  Treatment depends on the type and cause of your seizures.  For physiologic nonepileptic seizures, treatment is aimed at addressing the underlying condition that caused the seizures. These seizures usually stop when the underlying condition is properly  treated.  Nonepileptic seizures do not respond to the seizure medicines used to treat epilepsy.  For psychogenic seizures, you may need to work with a mental health specialist. [place] care will depend on the type of nonepileptic seizures you have.   Follow all your health care provider's instructions.  Keep all your follow-up appointments. SEEK MEDICAL CARE IF: You continue to have seizures after treatment. SEEK IMMEDIATE MEDICAL CARE IF:  Your seizures change or become more frequent.  You injure yourself during a seizure.  You have one seizure after another.  You have trouble recovering from a seizure.  You have chest pain or trouble breathing. MAKE SURE YOU:  Understand these instructions.  Will watch your condition.  Will get help right away if you are not doing well or get worse. Document Released: 11/17/2005 Document Revised: 02/16/2014 Document Reviewed: 07/29/2013 [facility] Patient Information 2015 [place], [place]. This information is not intended to replace advice given to you  by your health care provider. Make sure you discuss any questions you have with your health care provider.

## 2014-08-22 NOTE — Discharge Summary (Signed)
[name], is a 80 y.o. female  DOB Dec 22, 1927  MRN [id number].  Admission date:  08/20/2014  Admitting Physician  Cristal Ford, DO  Discharge Date:  08/22/2014   Primary MD  Jani Gravel, MD  Recommendations for primary care physician for things to follow:   Follow glycemic control and lipid panel closely.   Admission Diagnosis  Transient alteration of awareness [R40.4] Seizure [R56.9]   Discharge Diagnosis  Transient alteration of awareness [R40.4] Seizure [R56.9]     Active Problems:   Esophageal reflux   Diabetes mellitus   PE (pulmonary embolism)   Altered mental status   Slurred speech      Past Medical History  Diagnosis Date  . Osteoarthritis   . Degenerative joint disease   . Lumbar spinal stenosis   . Diabetes mellitus, type 2   . Hypertension   . Gout   . GERD (gastroesophageal reflux disease)   . Fundic gland polyps of stomach, benign   . Helicobacter pylori gastritis   . Shingles     current shingles- x4 yrs., on L side of face     . Cancer     melanoma- R arm , area excised   . Tubular adenoma of colon 07/2011  . Hiatal hernia     Past Surgical History  Procedure Laterality Date  . Total knee arthroplasty      left  . Lumbar laminectomy      6 prior back surgery   . Cholecystectomy  1998  . Vaginal hysterectomy  1990's  . Cataract extraction      bilateral, /w IOL  . Retinal laser procedure      bilateral  . Rotator cuff repair      bilateral  . Carpal tunnel release      bilateral  . Hemorrhoid surgery      x 2  . Tonsillectomy    . Benign br. biopsy- many yrs. ago    . Spinal fusion  08/22/2011    Procedure: FUSION POSTERIOR SPINAL MULTILEVEL/SCOLIOSIS;  Surgeon: Gunnar Bulla;  Location: [facility];  Service: Orthopedics;  Laterality: N/A;  LUMBAR LAMINECTOMY, EXTENSION OF  POSTERIOR LUMBAR FUSION TO T10 WITH K2M RODS, SCREWS, CONNECTORS, HOOKS, ILIAC CREST BONE GRAFT       History of present illness and  Hospital Course:     Kindly see H&P for history of present illness and admission details, please review complete Labs, Consult reports and Test reports for all details in brief  HPI  from the history and physical done on the day of admission  [name] is a 80 y.o. female with a history of hypertension, diabetes mellitus type 2, who presented to the emergency department with complaints of slurred speech. On the day of admission, patient was with her sister at [facility] for grocery shopping. Information was obtained from EDP and patient. Patient sister who was not at bedside, noticed that the patient was acting differently and had slurred speech that was nonsensical. Patient remained confused  while at [facility] however stated to her sister she needed to go to the bathroom. While in the ladies room, patient noticed that she had defecated on herself and called sister for assistance. Patient's sister noticed that she was progressively worsening and still had nonsensical speech. On arrival to the emergency department patient was noted to be confused on admission however patient's symptoms seem to have resolved. CT of the brain showed no acute abnormalities. Code stroke was called and neurology was consulted.    Hospital Course    1.Slurred Speech / Confusion - CVA vs Seizure - head CT An brain MRI both nonacute, EEG unremarkable, seen by neuro discussed with Dr. Nicole Kindred neurologist today he is convinced that patient likely had an episode of seizure and should be discharged on Keppra, she stable and back to baseline, we will continue her home dose 81 mg aspirin, must follow with neurology outpatient post discharge. Seizure precaution and activity instructions given in writing.     2. DM2 -  A1c was 7.9, at this point home regimen is resumed request PCP to  monitor glycemic control and A1c closely.    3.HTN - continue ACE-Norvasc.    4.GERD - PPI    5.Low Mag - replaced and stable       Discharge Condition: stable   Follow UP  Follow-up Information    Follow up with Jani Gravel, MD. Schedule an appointment as soon as possible for a visit in 1 week.   Specialty:  Internal Medicine   Contact information:   [address] [place] [address] [phone]       Follow up with [facility]. Schedule an appointment as soon as possible for a visit in 1 week.   Contact information:   [address]     [place] [address] [phone]        Discharge Instructions  and  Discharge Medications      Discharge Instructions    Discharge instructions    Complete by:  As directed   Follow with Primary MD Jani Gravel, MD in 7 days   Get CBC, CMP, 2 view Chest X ray checked  by Primary MD next visit.    Do not drive, operating heavy machinery, perform activities at heights, swimming or participation in water activities or provide baby sitting services until you have seen by Primary MD or a Neurologist and advised to do so again.  Activity: As tolerated with Full fall precautions use walker/cane & assistance as needed   Disposition Home     Diet: Heart Healthy Low carb.  For Heart failure patients - Check your Weight same time everyday, if you gain over 2 pounds, or you develop in leg swelling, experience more shortness of breath or chest pain, call your Primary MD immediately. Follow Cardiac Low Salt Diet and 1.8 lit/day fluid restriction.   On your next visit with your primary care physician please Get Medicines reviewed and adjusted.   Please request your Prim.MD to go over all Hospital Tests and Procedure/Radiological results at the follow up, please get all Hospital records sent to your Prim MD by signing hospital release before you go home.   If you  experience worsening of your admission symptoms, develop shortness of breath, life threatening emergency, suicidal or homicidal thoughts you must seek medical attention immediately by calling 911 or calling your MD immediately  if symptoms less severe.  You Must read complete instructions/literature along with all  the possible adverse reactions/side effects for all the Medicines you take and that have been prescribed to you. Take any new Medicines after you have completely understood and accpet all the possible adverse reactions/side effects.   Do not drive when taking Pain medications.    Do not take more than prescribed Pain, Sleep and Anxiety Medications  Special Instructions: If you have smoked or chewed Tobacco  in the last 2 yrs please stop smoking, stop any regular Alcohol  and or any Recreational drug use.  Wear Seat belts while driving.   Please note  You were cared for by a hospitalist during your hospital stay. If you have any questions about your discharge medications or the care you received while you were in the hospital after you are discharged, you can call the unit and asked to speak with the hospitalist on call if the hospitalist that took care of you is not available. Once you are discharged, your primary care physician will handle any further medical issues. Please note that NO REFILLS for any discharge medications will be authorized once you are discharged, as it is imperative that you return to your primary care physician (or establish a relationship with a primary care physician if you do not have one) for your aftercare needs so that they can reassess your need for medications and monitor your lab values.     Increase activity slowly    Complete by:  As directed             Medication List    TAKE these medications        amLODipine 5 MG tablet  Commonly known as:  NORVASC  Take 5 mg by mouth daily.     aspirin EC 81 MG tablet  Take 81 mg by mouth daily.      atorvastatin 10 MG tablet  Commonly known as:  LIPITOR  Take 1 tablet (10 mg total) by mouth daily at 6 PM.     calcium carbonate 600 MG Tabs tablet  Commonly known as:  OS-CAL  Take 600 mg by mouth daily with breakfast.     glimepiride 4 MG tablet  Commonly known as:  AMARYL  Take 4 mg by mouth 2 (two) times daily.     HYDROcodone-acetaminophen 5-325 MG per tablet  Commonly known as:  NORCO/VICODIN  Take 1 tablet by mouth every 6 (six) hours as needed for pain.     levETIRAcetam 500 MG tablet  Commonly known as:  KEPPRA  Take 1 tablet (500 mg total) by mouth 2 (two) times daily.     metFORMIN 500 MG tablet  Commonly known as:  GLUCOPHAGE  Take 500 mg by mouth 4 (four) times daily.     omeprazole 20 MG capsule  Commonly known as:  PRILOSEC  Take 20 mg by mouth daily.     ramipril 10 MG capsule  Commonly known as:  ALTACE  Take 10 mg by mouth daily.     SYSTANE OP  Apply 1 drop to eye daily as needed. For dry eyes          Diet and Activity recommendation: See Discharge Instructions above   Consults obtained - Neuro   Major procedures and Radiology Reports - PLEASE review detailed and final reports for all details, in brief -   EEG  Interpretation: This is a normal EEG recording during wakefulness and during sleep.   Ct Head (brain) Wo Contrast  08/20/2014   CLINICAL DATA:  Code  stroke. Slurred speech. Altered mental status. Incontinence. Loss of bowel function. Initial encounter. Altered mental status.  EXAM: CT HEAD WITHOUT CONTRAST  TECHNIQUE: Contiguous axial images were obtained from the base of the skull through the vertex without intravenous contrast.  COMPARISON:  02/24/2013.  FINDINGS: No mass lesion, mass effect, midline shift, hydrocephalus, hemorrhage. No acute territorial cortical ischemia/infarct. Atrophy and chronic ischemic white matter disease is present.  Visible mastoid air cells appear clear. Intracranial atherosclerosis is present. Gray-white  differentiation appears normal and symmetric. Unchanged dural calcification over the LEFT tentorium. RIGHT maxillary mucosal thickening is present. Chronic partial LEFT sphenoid sinus opacification with calcifications. Calcification of the transverse ligament of the atlas incidentally noted.  IMPRESSION: 1. Age expected atrophy and chronic ischemic white matter disease without acute intracranial abnormality. 2. Critical Value/emergent results were called by telephone at the time of interpretation on 08/20/2014 at 1:11 pm to Dr. Nicole Kindred , who verbally acknowledged these results.   Electronically Signed   By: Dereck Ligas M.D.   On: 08/20/2014 13:12   Mr Jeri Cos FB Contrast  08/21/2014   CLINICAL DATA:  Slurred speech with confusion beginning 24 hr earlier. Symptoms have now resolved. Suspected seizure. Initial encounter.  EXAM: MRI HEAD WITHOUT AND WITH CONTRAST  TECHNIQUE: Multiplanar, multiecho pulse sequences of the brain and surrounding structures were obtained without and with intravenous contrast.  CONTRAST:  48mL MULTIHANCE GADOBENATE DIMEGLUMINE 529 MG/ML IV SOLN  COMPARISON:  CT head 08/20/2014.  FINDINGS: The patient was unable to remain motionless for the exam. Small or subtle lesions could be overlooked.  No evidence for acute infarction, hemorrhage, mass lesion, hydrocephalus, or extra-axial fluid. No evidence for gyriform restricted diffusion to suggest postictal phenomenon.  Moderate cerebral and cerebellar atrophy. Mild subcortical and periventricular T2 and FLAIR hyperintensities, likely chronic microvascular ischemic change. Scattered areas of lacunar infarction are chronic. Flow voids are maintained throughout the carotid, basilar, and vertebral arteries. There are no areas of chronic hemorrhage. Pituitary, pineal, and cerebellar tonsils unremarkable. No upper cervical lesions.  Coronal images performed with small field-of-view and thin sections demonstrates no temporal lobe mass or  inflammatory process.  Post infusion, no abnormal enhancement of the brain or meninges. Visualized calvarium, skull base, and upper cervical osseous structures unremarkable. Scalp and extracranial soft tissues, orbits, sinuses, and mastoids show no acute process.  Good general agreement prior CT.  IMPRESSION: Atrophy and small vessel disease.  No evidence for acute stroke, abnormal postcontrast enhancement, or post-ictal sequelae.   Electronically Signed   By: Rolla Flatten M.D.   On: 08/21/2014 15:33    Micro Results      No results found for this or any previous visit (from the past 240 hour(s)).     Today   Subjective:   [name] today has no headache,no chest abdominal pain,no new weakness tingling or numbness, feels much better wants to go home today.    Objective:   Blood pressure 150/65, pulse 86, temperature 97.9 F (36.6 C), temperature source Oral, resp. rate 18, height 5\' 4"  (1.626 m), weight 73.846 kg (162 lb 12.8 oz), SpO2 95 %.   Intake/Output Summary (Last 24 hours) at 08/22/14 1044 Last data filed at 08/22/14 0815  Gross per 24 hour  Intake    660 ml  Output      0 ml  Net    660 ml    Exam Awake Alert, Oriented x 3, No new F.N deficits, Normal affect [place].AT,PERRAL Supple Neck,No JVD, No cervical  lymphadenopathy appriciated.  Symmetrical Chest wall movement, Good air movement bilaterally, CTAB RRR,No Gallops,Rubs or new Murmurs, No Parasternal Heave +ve B.Sounds, Abd Soft, Non tender, No organomegaly appriciated, No rebound -guarding or rigidity. No Cyanosis, Clubbing or edema, No new Rash or bruise  Data Review   CBC w Diff: Lab Results  Component Value Date   WBC 10.4 08/20/2014   HGB 12.7 08/20/2014   HCT 37.2 08/20/2014   PLT 222 08/20/2014   LYMPHOPCT 27 08/20/2014   MONOPCT 6 08/20/2014   EOSPCT 4 08/20/2014   BASOPCT 1 08/20/2014    CMP: Lab Results  Component Value Date   NA 141 08/20/2014   K 4.2 08/20/2014   CL 102 08/20/2014    CO2 22 08/20/2014   BUN 15 08/20/2014   CREATININE 0.74 08/20/2014   PROT 6.8 08/20/2014   ALBUMIN 3.9 08/20/2014   BILITOT 0.4 08/20/2014   ALKPHOS 59 08/20/2014   AST 38* 08/20/2014   ALT 28 08/20/2014   Lab Results  Component Value Date   HGBA1C 7.9* 08/21/2014    Lab Results  Component Value Date   CHOL 204* 08/21/2014   HDL 41 08/21/2014   LDLCALC 103* 08/21/2014   TRIG 302* 08/21/2014   CHOLHDL 5.0 08/21/2014      Total Time in preparing paper work, data evaluation and todays exam - 35 minutes  Lala Lund K M.D on 08/22/2014 at 10:44 AM  [facility] Office  [phone]

## 2014-08-22 NOTE — Progress Notes (Signed)
Patient was discharged home with her sister. IV removed, discharge instructions and medications given and discussed with patient. Patient states she understands the discharge instructions and medications.

## 2014-08-31 ENCOUNTER — Other Ambulatory Visit (HOSPITAL_COMMUNITY): Payer: Self-pay | Admitting: Internal Medicine

## 2014-08-31 DIAGNOSIS — Z1231 Encounter for screening mammogram for malignant neoplasm of breast: Secondary | ICD-10-CM

## 2014-09-02 ENCOUNTER — Encounter: Payer: Self-pay | Admitting: Neurology

## 2014-09-08 ENCOUNTER — Encounter: Payer: Self-pay | Admitting: Neurology

## 2014-09-17 ENCOUNTER — Ambulatory Visit (HOSPITAL_COMMUNITY)
Admission: RE | Admit: 2014-09-17 | Discharge: 2014-09-17 | Disposition: A | Discharge status: Home / Self Care | Payer: Medicare Other | Source: Ambulatory Visit | Attending: Internal Medicine | Admitting: Internal Medicine

## 2014-09-17 DIAGNOSIS — Z1231 Encounter for screening mammogram for malignant neoplasm of breast: Secondary | ICD-10-CM

## 2015-02-11 ENCOUNTER — Other Ambulatory Visit: Payer: Self-pay | Admitting: Orthopaedic Surgery

## 2015-02-11 DIAGNOSIS — M461 Sacroiliitis, not elsewhere classified: Secondary | ICD-10-CM

## 2015-02-23 ENCOUNTER — Ambulatory Visit
Admission: RE | Admit: 2015-02-23 | Discharge: 2015-02-23 | Disposition: A | Discharge status: Home / Self Care | Payer: Medicare Other | Source: Ambulatory Visit | Attending: Orthopaedic Surgery | Admitting: Orthopaedic Surgery

## 2015-02-23 DIAGNOSIS — M461 Sacroiliitis, not elsewhere classified: Secondary | ICD-10-CM

## 2015-02-23 MED ORDER — GADOBENATE DIMEGLUMINE 529 MG/ML IV SOLN
14.0000 mL | Freq: Once | INTRAVENOUS | Status: AC | PRN
  Administered 2015-02-23: 14 mL via INTRAVENOUS

## 2015-02-25 ENCOUNTER — Ambulatory Visit (INDEPENDENT_AMBULATORY_CARE_PROVIDER_SITE_OTHER): Payer: Medicare Other | Admitting: Neurology

## 2015-02-25 ENCOUNTER — Encounter: Payer: Self-pay | Admitting: Neurology

## 2015-02-25 ENCOUNTER — Ambulatory Visit (INDEPENDENT_AMBULATORY_CARE_PROVIDER_SITE_OTHER): Payer: Self-pay | Admitting: Neurology

## 2015-02-25 DIAGNOSIS — M5431 Sciatica, right side: Secondary | ICD-10-CM

## 2015-02-25 DIAGNOSIS — M5430 Sciatica, unspecified side: Secondary | ICD-10-CM

## 2015-02-25 DIAGNOSIS — R404 Transient alteration of awareness: Secondary | ICD-10-CM

## 2015-02-25 DIAGNOSIS — E1342 Other specified diabetes mellitus with diabetic polyneuropathy: Secondary | ICD-10-CM

## 2015-02-25 DIAGNOSIS — G629 Polyneuropathy, unspecified: Secondary | ICD-10-CM | POA: Diagnosis not present

## 2015-02-25 DIAGNOSIS — E1142 Type 2 diabetes mellitus with diabetic polyneuropathy: Secondary | ICD-10-CM

## 2015-02-25 DIAGNOSIS — M5432 Sciatica, left side: Secondary | ICD-10-CM

## 2015-02-25 NOTE — Progress Notes (Signed)
Please refer to EMG and NCV procedure note. 

## 2015-02-25 NOTE — Procedures (Signed)
HISTORY:  [name] is an 81 year old patient with a history of 7 lumbar spine procedures in the past. The patient has developed onset of low back pain, with sciatica type pain down both legs, right greater than left. The patient has a history of diabetes over the last 15 years. She is being evaluated for possible neuropathy or a lumbar radiculopathy.  NERVE CONDUCTION STUDIES:  Nerve conduction studies were performed on the right upper extremity. The distal motor latency for the right median nerve was prolonged, with a normal motor amplitude. The distal motor latency and motor amplitude for the right ulnar nerve was normal. The F wave latencies and nerve conduction velocities for the right median and ulnar nerves were normal. The sensory latencies for the right median and ulnar nerves were normal.  Nerve conduction studies were performed on both lower extremities. The distal motor latency for the right peroneal nerve was normal, with a low motor amplitude. No response was seen on the left, but pick up to the tibialis anterior muscle reveals a normal distal motor latency with a low motor amplitude. The nerve conduction velocities for the peroneal nerves were normal bilaterally. The distal motor latencies for the posterior tibial nerves were normal bilaterally, with a normal motor amplitude on the right, low on the left. The nerve conduction velocities for the posterior tibial nerves were normal bilaterally. The H reflex latencies were unobtainable bilaterally. The peroneal sensory latencies were unobtainable bilaterally.  EMG STUDIES:  EMG study was performed on the right lower extremity:  The tibialis anterior muscle reveals 2 to 8K motor units with markedly reduced recruitment. 2+ fibrillations and positive waves were seen. The peroneus tertius muscle reveals 2 to 10K motor units with markedly reduced recruitment. 1+ fibrillations and positive waves were seen. The medial gastrocnemius  muscle reveals 1 to 3K motor units with decreased recruitment. 1+ fibrillations and positive waves were seen. The vastus lateralis muscle reveals 2 to 5K motor units with slightly decreased recruitment. No fibrillations or positive waves were seen. The iliopsoas muscle reveals 2 to 4K motor units with full recruitment. No fibrillations or positive waves were seen. Complex repetitive discharges were seen. The biceps femoris muscle (long head) reveals 2 to 6K motor units with decreased recruitment. No fibrillations or positive waves were seen. The lumbosacral paraspinal muscles were tested at 3 levels, and revealed decreased insertional activity at all 3 levels tested. There was good relaxation.  EMG study was performed on the left lower extremity:  The tibialis anterior muscle reveals 2 to 6K motor units with decreased recruitment. No fibrillations or positive waves were seen. The peroneus tertius muscle reveals 2 to 7K motor units with moderately decreased recruitment. No fibrillations or positive waves were seen. The medial gastrocnemius muscle reveals 1 to 4K motor units with slightly decreased recruitment. No fibrillations or positive waves were seen. The vastus lateralis muscle reveals 2 to 4K motor units with full recruitment. No fibrillations or positive waves were seen. The iliopsoas muscle reveals 2 to 4K motor units with full recruitment. No fibrillations or positive waves were seen. Complex repetitive discharges were seen. The biceps femoris muscle (long head) reveals 2 to 4K motor units with full recruitment. No fibrillations or positive waves were seen. The lumbosacral paraspinal muscles were tested at 3 levels, and revealed decreased insertional activity at all 3 levels tested. There was good relaxation.   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 There is evidence of a borderline right carpal tunnel syndrome. EMG evaluation of the right lower extremity shows distal acute and chronic denervation consistent with peripheral neuropathy, with evidence of an overlying primarily chronic L5 radiculopathy. EMG evaluation of the left lower extremity shows distal primarily chronic denervation consistent with a peripheral neuropathy. There is no clear evidence of an overlying lumbosacral radiculopathy on the left.  Jill Alexanders MD 02/25/2015 10:47 AM  [facility] [address] [place], [place] [address]  Phone [phone] Fax [phone]

## 2015-07-22 ENCOUNTER — Ambulatory Visit (INDEPENDENT_AMBULATORY_CARE_PROVIDER_SITE_OTHER): Payer: Medicare Other | Admitting: Ophthalmology

## 2015-07-22 DIAGNOSIS — H35033 Hypertensive retinopathy, bilateral: Secondary | ICD-10-CM

## 2015-07-22 DIAGNOSIS — H43813 Vitreous degeneration, bilateral: Secondary | ICD-10-CM

## 2015-07-22 DIAGNOSIS — H348122 Central retinal vein occlusion, left eye, stable: Secondary | ICD-10-CM | POA: Diagnosis not present

## 2015-07-22 DIAGNOSIS — H348111 Central retinal vein occlusion, right eye, with retinal neovascularization: Secondary | ICD-10-CM | POA: Diagnosis not present

## 2015-07-22 DIAGNOSIS — H353134 Nonexudative age-related macular degeneration, bilateral, advanced atrophic with subfoveal involvement: Secondary | ICD-10-CM

## 2015-07-22 DIAGNOSIS — I10 Essential (primary) hypertension: Secondary | ICD-10-CM

## 2015-10-15 ENCOUNTER — Encounter (HOSPITAL_BASED_OUTPATIENT_CLINIC_OR_DEPARTMENT_OTHER): Payer: Self-pay | Admitting: *Deleted

## 2015-10-15 ENCOUNTER — Emergency Department (HOSPITAL_BASED_OUTPATIENT_CLINIC_OR_DEPARTMENT_OTHER)
Admission: EM | Admit: 2015-10-15 | Discharge: 2015-10-15 | Disposition: A | Discharge status: Home / Self Care | Payer: Medicare Other | Attending: Emergency Medicine | Admitting: Emergency Medicine

## 2015-10-15 DIAGNOSIS — M25511 Pain in right shoulder: Secondary | ICD-10-CM | POA: Diagnosis not present

## 2015-10-15 DIAGNOSIS — M62830 Muscle spasm of back: Secondary | ICD-10-CM | POA: Insufficient documentation

## 2015-10-15 DIAGNOSIS — Y9389 Activity, other specified: Secondary | ICD-10-CM | POA: Diagnosis not present

## 2015-10-15 DIAGNOSIS — Y9289 Other specified places as the place of occurrence of the external cause: Secondary | ICD-10-CM | POA: Diagnosis not present

## 2015-10-15 DIAGNOSIS — Z7982 Long term (current) use of aspirin: Secondary | ICD-10-CM | POA: Diagnosis not present

## 2015-10-15 DIAGNOSIS — M1990 Unspecified osteoarthritis, unspecified site: Secondary | ICD-10-CM | POA: Diagnosis not present

## 2015-10-15 DIAGNOSIS — Z85820 Personal history of malignant melanoma of skin: Secondary | ICD-10-CM | POA: Insufficient documentation

## 2015-10-15 DIAGNOSIS — X58XXXA Exposure to other specified factors, initial encounter: Secondary | ICD-10-CM | POA: Insufficient documentation

## 2015-10-15 DIAGNOSIS — I10 Essential (primary) hypertension: Secondary | ICD-10-CM | POA: Diagnosis not present

## 2015-10-15 DIAGNOSIS — S161XXA Strain of muscle, fascia and tendon at neck level, initial encounter: Secondary | ICD-10-CM | POA: Insufficient documentation

## 2015-10-15 DIAGNOSIS — Z79899 Other long term (current) drug therapy: Secondary | ICD-10-CM | POA: Diagnosis not present

## 2015-10-15 DIAGNOSIS — M25512 Pain in left shoulder: Secondary | ICD-10-CM | POA: Diagnosis not present

## 2015-10-15 DIAGNOSIS — S199XXA Unspecified injury of neck, initial encounter: Secondary | ICD-10-CM | POA: Diagnosis present

## 2015-10-15 DIAGNOSIS — Y998 Other external cause status: Secondary | ICD-10-CM | POA: Diagnosis not present

## 2015-10-15 DIAGNOSIS — Z86018 Personal history of other benign neoplasm: Secondary | ICD-10-CM | POA: Diagnosis not present

## 2015-10-15 DIAGNOSIS — E119 Type 2 diabetes mellitus without complications: Secondary | ICD-10-CM | POA: Insufficient documentation

## 2015-10-15 DIAGNOSIS — K219 Gastro-esophageal reflux disease without esophagitis: Secondary | ICD-10-CM | POA: Insufficient documentation

## 2015-10-15 DIAGNOSIS — Z8619 Personal history of other infectious and parasitic diseases: Secondary | ICD-10-CM | POA: Diagnosis not present

## 2015-10-15 MED ORDER — CAPSAICIN-MENTHOL-METHYL SAL 0.025-1-12 % EX CREA: 1.0000 "application " | TOPICAL_CREAM | Freq: Three times a day (TID) | CUTANEOUS | Status: DC | PRN

## 2015-10-15 MED ORDER — BUPIVACAINE HCL 0.5 % IJ SOLN
20.0000 mL | Freq: Once | INTRAMUSCULAR | Status: AC
  Administered 2015-10-15: 1 mL
  Filled 2015-10-15: qty 1

## 2015-10-15 NOTE — Discharge Instructions (Signed)

## 2015-10-15 NOTE — ED Provider Notes (Signed)
CSN: [id number]     Arrival date & time 10/15/15  1108 History   First MD Initiated Contact with Patient 10/15/15 1126     Chief Complaint  Patient presents with  . Muscle Pain     (Consider location/radiation/quality/duration/timing/severity/associated sxs/prior Treatment) Patient is a 81 y.o. female presenting with back pain. The history is provided by the patient.  Back Pain Pain location: bilateral shoulders and neck. Quality:  Aching Radiates to:  Does not radiate Pain severity:  Moderate Pain is:  Same all the time Onset quality:  Gradual Duration:  2 days Timing:  Constant Progression:  Unchanged Chronicity:  New Context: emotional stress   Context: not physical stress, not recent illness and not recent injury   Relieved by:  Nothing Worsened by:  Nothing tried Ineffective treatments:  None tried Associated symptoms: no fever, no headaches, no numbness and no weakness   Risk factors: lack of exercise     Past Medical History  Diagnosis Date  . Osteoarthritis   . Degenerative joint disease   . Lumbar spinal stenosis   . Diabetes mellitus, type 2 ([place])   . Hypertension   . Gout   . GERD (gastroesophageal reflux disease)   . Fundic gland polyps of stomach, benign   . Helicobacter pylori gastritis   . Shingles     current shingles- x4 yrs., on L side of face     . Cancer (HCC)     melanoma- R arm , area excised   . Tubular adenoma of colon 07/2011  . Hiatal hernia    Past Surgical History  Procedure Laterality Date  . Total knee arthroplasty      left  . Lumbar laminectomy      6 prior back surgery   . Cholecystectomy  1998  . Vaginal hysterectomy  1990's  . Cataract extraction      bilateral, /w IOL  . Retinal laser procedure      bilateral  . Rotator cuff repair      bilateral  . Carpal tunnel release      bilateral  . Hemorrhoid surgery      x 2  . Tonsillectomy    . Benign br. biopsy- many yrs. ago    . Spinal fusion  08/22/2011   Procedure: FUSION POSTERIOR SPINAL MULTILEVEL/SCOLIOSIS;  Surgeon: Gunnar Bulla;  Location: [facility];  Service: Orthopedics;  Laterality: N/A;  LUMBAR LAMINECTOMY, EXTENSION OF POSTERIOR LUMBAR FUSION TO T10 WITH K2M RODS, SCREWS, CONNECTORS, HOOKS, ILIAC CREST BONE GRAFT   Family History  Problem Relation Age of Onset  . Colon cancer Neg Hx   . Anesthesia problems Neg Hx   . Hypotension Neg Hx   . Malignant hyperthermia Neg Hx   . Pseudochol deficiency Neg Hx   . Diabetes Mother   . Cancer Mother     mother  . Diabetes Paternal Aunt     x 1  . Diabetes Paternal Uncle     x 3  . Diabetes Brother   . Diabetes Other     neice  . Heart disease Father   . Cancer Father     gallbladder  . Liver cancer Father   . Heart disease Brother   . Colon polyps Sister    Social History  Substance Use Topics  . Smoking status: Never Smoker   . Smokeless tobacco: Never Used  . Alcohol Use: No   OB History    No data available  Review of Systems  Constitutional: Negative for fever.  Musculoskeletal: Positive for back pain.  Neurological: Negative for weakness, numbness and headaches.  All other systems reviewed and are negative.     Allergies  Celebrex; Morphine sulfate; and Percocet  Home Medications   Prior to Admission medications   Medication Sig Start Date End Date Taking? Authorizing Provider  amLODipine (NORVASC) 5 MG tablet Take 5 mg by mouth daily.      Historical Provider, MD  aspirin EC 81 MG tablet Take 81 mg by mouth daily.    Historical Provider, MD  atorvastatin (LIPITOR) 10 MG tablet Take 1 tablet (10 mg total) by mouth daily at 6 PM. 08/22/14   Thurnell Lose, MD  calcium carbonate (OS-CAL) 600 MG TABS tablet Take 600 mg by mouth daily with breakfast.    Historical Provider, MD  glimepiride (AMARYL) 4 MG tablet Take 4 mg by mouth 2 (two) times daily.    Historical Provider, MD  HYDROcodone-acetaminophen (NORCO/VICODIN) 5-325 MG per tablet Take 1 tablet by  mouth every 6 (six) hours as needed for pain.    Historical Provider, MD  levETIRAcetam (KEPPRA) 500 MG tablet Take 1 tablet (500 mg total) by mouth 2 (two) times daily. 08/22/14   Thurnell Lose, MD  metFORMIN (GLUCOPHAGE) 500 MG tablet Take 500 mg by mouth 4 (four) times daily.     Historical Provider, MD  omeprazole (PRILOSEC) 20 MG capsule Take 20 mg by mouth daily.    Historical Provider, MD  Polyethyl Glycol-Propyl Glycol (SYSTANE OP) Apply 1 drop to eye daily as needed. For dry eyes    Historical Provider, MD  ramipril (ALTACE) 10 MG capsule Take 10 mg by mouth daily.      Historical Provider, MD   BP 175/77 mmHg  Pulse 100  Temp(Src) 98.2 F (36.8 C) (Oral)  Resp 18  Ht 5\' 2"  (1.575 m)  Wt 162 lb (73.483 kg)  BMI 29.62 kg/m2  SpO2 95% Physical Exam  Constitutional: She is oriented to person, place, and time. She appears well-developed and well-nourished. No distress.  HENT:  Head: Normocephalic.  Eyes: Conjunctivae are normal.  Neck: Neck supple. No tracheal deviation present.  Cardiovascular: Normal rate and regular rhythm.   Pulmonary/Chest: Effort normal. No respiratory distress.  Abdominal: Soft. She exhibits no distension.  Musculoskeletal:       Cervical back: She exhibits tenderness (over bilateral neck muscles and superior trapezius muscles R>L). She exhibits no bony tenderness.       Back:  Neurological: She is alert and oriented to person, place, and time. She has normal strength. No cranial nerve deficit or sensory deficit. Coordination normal.  Skin: Skin is warm and dry.  Psychiatric: She has a normal mood and affect.    ED Course  Procedures (including critical care time)  Procedure Note: Trigger Point Injection for Myofascial pain  Performed by Dr. Laneta Simmers Indication: muscle/myofascial pain Muscle body and tendon sheath of the right trapezius, left trapezius, and right posterior cervical paraspinal muscle(s) were injected with 0.5% bupivacaine under  sterile technique for release of muscle spasm/pain. Patient tolerated well with immediate improvement of symptoms and no immediate complications following procedure.  CPT Code:   3 or more muscle bodies: 20553    Labs Review Labs Reviewed - No data to display  Imaging Review No results found. I have personally reviewed and evaluated these images and lab results as part of my medical decision-making.   EKG Interpretation None  MDM   Final diagnoses:  Neck strain, initial encounter  Spasm of back muscles    81 y.o. female presents with bilateral neck and shoulder pain. Has been under increased emotional stress with recent difficulty with grandson and L-spine injection that she is worried may be related. Pt focally tender over musculature of shoulders and neck suggestive of MSK pain. Offered trigger point injections for symptomatic relief of muscle spasm. Recommended topical therapy with capsaicin as Pt was concerned about cost of voltaren gel. Has not tolerated oral NSAIDs well previously. Plan to follow up with PCP as needed and return precautions discussed for worsening or new concerning symptoms.     Leo Grosser, MD 10/15/15 2109

## 2015-10-15 NOTE — ED Notes (Signed)
Pain in her left scapula into her neck x 2 days. Today she woke with the same pain in her right scapula into the right side of her neck. Painful with movement.

## 2015-10-21 ENCOUNTER — Ambulatory Visit (INDEPENDENT_AMBULATORY_CARE_PROVIDER_SITE_OTHER): Payer: Medicare Other

## 2015-10-21 ENCOUNTER — Ambulatory Visit (INDEPENDENT_AMBULATORY_CARE_PROVIDER_SITE_OTHER): Payer: Medicare Other | Admitting: Podiatry

## 2015-10-21 ENCOUNTER — Encounter: Payer: Self-pay | Admitting: Podiatry

## 2015-10-21 DIAGNOSIS — B353 Tinea pedis: Secondary | ICD-10-CM

## 2015-10-21 DIAGNOSIS — M79672 Pain in left foot: Secondary | ICD-10-CM | POA: Diagnosis not present

## 2015-10-21 DIAGNOSIS — Q828 Other specified congenital malformations of skin: Secondary | ICD-10-CM | POA: Diagnosis not present

## 2015-10-21 DIAGNOSIS — M79671 Pain in right foot: Secondary | ICD-10-CM

## 2015-10-21 DIAGNOSIS — E119 Type 2 diabetes mellitus without complications: Secondary | ICD-10-CM

## 2015-10-21 NOTE — Progress Notes (Signed)
   Subjective:    Patient ID: [name], female    DOB: 10-Aug-1928, 82 y.o.   MRN: [id number]  HPI: She presents today as a diabetic stating that my right foot has been hurting for the past 2 days however today feels much better. She states that she has a calcified plantar aspect of the fifth metatarsal head of the right foot which she can no longer reach to smooth down. She states that she awoke the other morning with severe pain at the fifth metatarsal head and was concerned about a fracture.    Review of Systems  All other systems reviewed and are negative.      Objective:   Physical Exam: 82 year old female presents with her sister today vital signs stable alert and oriented 3 pulses are palpable. Neurologic sensorium is intact. Deep tendon reflexes are intact. Muscle strength is normal bilateral. Orthopedic evaluation demonstrates all joints distal to the ankle range of motion without crepitation. Mild hammertoe deformities are noted. Tenderness bunion deformities bilateral. Right greater than left. Reactive hyperkeratosis is noted sub-fifth bilaterally. Her toenails are slightly elongated and thickened as well.        Assessment & Plan:  Assessment: Pain in limb segment onychomycosis and callus with history of diabetes non-complicated.  Plan: Debridement of all reactive hyperkeratotic tissue debridement of nails 1 through 5 bilateral. Follow up with her in 3 months.

## 2015-11-08 ENCOUNTER — Other Ambulatory Visit: Payer: Self-pay

## 2015-11-08 DIAGNOSIS — Z1231 Encounter for screening mammogram for malignant neoplasm of breast: Secondary | ICD-10-CM

## 2015-12-02 ENCOUNTER — Ambulatory Visit
Admission: RE | Admit: 2015-12-02 | Discharge: 2015-12-02 | Disposition: A | Discharge status: Home / Self Care | Payer: Medicare Other | Source: Ambulatory Visit

## 2015-12-02 DIAGNOSIS — Z1231 Encounter for screening mammogram for malignant neoplasm of breast: Secondary | ICD-10-CM

## 2016-01-20 ENCOUNTER — Ambulatory Visit: Payer: Medicare Other | Admitting: Podiatry

## 2016-01-27 ENCOUNTER — Ambulatory Visit (INDEPENDENT_AMBULATORY_CARE_PROVIDER_SITE_OTHER): Payer: Medicare Other | Admitting: Podiatry

## 2016-01-27 DIAGNOSIS — M79676 Pain in unspecified toe(s): Secondary | ICD-10-CM | POA: Diagnosis not present

## 2016-01-27 DIAGNOSIS — Q828 Other specified congenital malformations of skin: Secondary | ICD-10-CM | POA: Diagnosis not present

## 2016-01-27 DIAGNOSIS — B351 Tinea unguium: Secondary | ICD-10-CM

## 2016-01-27 NOTE — Progress Notes (Signed)
Patient ID: [name], female   DOB: 06/02/1928, 82 y.o.   MRN: [id number] Complaint:  Visit Type: Patient returns to my office for continued preventative foot care services. Complaint: Patient states" my nails have grown long and thick and become painful to walk and wear shoes" Patient has been diagnosed with DM with no neuropathy.. The patient presents for preventative foot care services. No changes to ROS  Podiatric Exam: Vascular: dorsalis pedis and posterior tibial pulses are palpable bilateral. Capillary return is immediate. Temperature gradient is WNL. Skin turgor WNL  Sensorium: Diminished Semmes Weinstein monofilament test. Normal tactile sensation bilaterally. Nail Exam: Pt has thick disfigured discolored nails with subungual debris noted bilateral entire nail hallux through fifth toenails Ulcer Exam: There is no evidence of ulcer or pre-ulcerative changes or infection. Orthopedic Exam: Muscle tone and strength are WNL. No limitations in general ROM. No crepitus or effusions noted. Foot type and digits show no abnormalities. Bony prominences are unremarkable. Skin:  Porokeratosis 1,5 right. No infection or ulcers P  Diagnosis:  Onychomycosis, , Pain in right toe, pain in left toes, Porokeratosis  Treatment & Plan Procedures and Treatment: Consent by patient was obtained for treatment procedures. The patient understood the discussion of treatment and procedures well. All questions were answered thoroughly reviewed. Debridement of mycotic and hypertrophic toenails, 1 through 5 bilateral and clearing of subungual debris. No ulceration, no infection noted. Debride porokeratosis Return Visit-Office Procedure: Patient instructed to return to the office for a follow up visit 3 months for continued evaluation and treatment.    Gardiner Barefoot DPM

## 2016-02-03 ENCOUNTER — Telehealth: Payer: Self-pay | Admitting: *Deleted

## 2016-02-03 NOTE — Telephone Encounter (Signed)
Pt states the appt she set for 0000000 is in conflict with another appt, please call and reschedule to the following Thursday at the same time.

## 2016-03-01 ENCOUNTER — Inpatient Hospital Stay (HOSPITAL_COMMUNITY)
Admission: EM | Admit: 2016-03-01 | Discharge: 2016-03-07 | DRG: 579 | Disposition: A | Discharge status: Skilled Nursing Facility | Payer: Medicare Other | Attending: Family Medicine | Admitting: Family Medicine

## 2016-03-01 ENCOUNTER — Encounter (HOSPITAL_COMMUNITY): Payer: Self-pay | Admitting: Emergency Medicine

## 2016-03-01 ENCOUNTER — Emergency Department (HOSPITAL_COMMUNITY): Payer: Medicare Other

## 2016-03-01 DIAGNOSIS — Z888 Allergy status to other drugs, medicaments and biological substances status: Secondary | ICD-10-CM | POA: Diagnosis not present

## 2016-03-01 DIAGNOSIS — Z833 Family history of diabetes mellitus: Secondary | ICD-10-CM | POA: Diagnosis not present

## 2016-03-01 DIAGNOSIS — L02612 Cutaneous abscess of left foot: Secondary | ICD-10-CM | POA: Diagnosis present

## 2016-03-01 DIAGNOSIS — I10 Essential (primary) hypertension: Secondary | ICD-10-CM | POA: Diagnosis present

## 2016-03-01 DIAGNOSIS — L03116 Cellulitis of left lower limb: Secondary | ICD-10-CM | POA: Diagnosis present

## 2016-03-01 DIAGNOSIS — S90422A Blister (nonthermal), left great toe, initial encounter: Secondary | ICD-10-CM | POA: Diagnosis present

## 2016-03-01 DIAGNOSIS — S91332A Puncture wound without foreign body, left foot, initial encounter: Secondary | ICD-10-CM

## 2016-03-01 DIAGNOSIS — E119 Type 2 diabetes mellitus without complications: Secondary | ICD-10-CM | POA: Diagnosis not present

## 2016-03-01 DIAGNOSIS — R739 Hyperglycemia, unspecified: Secondary | ICD-10-CM

## 2016-03-01 DIAGNOSIS — Z8249 Family history of ischemic heart disease and other diseases of the circulatory system: Secondary | ICD-10-CM

## 2016-03-01 DIAGNOSIS — L97529 Non-pressure chronic ulcer of other part of left foot with unspecified severity: Secondary | ICD-10-CM | POA: Diagnosis present

## 2016-03-01 DIAGNOSIS — Z79899 Other long term (current) drug therapy: Secondary | ICD-10-CM

## 2016-03-01 DIAGNOSIS — N179 Acute kidney failure, unspecified: Secondary | ICD-10-CM | POA: Diagnosis present

## 2016-03-01 DIAGNOSIS — M1A9XX1 Chronic gout, unspecified, with tophus (tophi): Secondary | ICD-10-CM | POA: Diagnosis present

## 2016-03-01 DIAGNOSIS — Z981 Arthrodesis status: Secondary | ICD-10-CM | POA: Diagnosis not present

## 2016-03-01 DIAGNOSIS — Z85820 Personal history of malignant melanoma of skin: Secondary | ICD-10-CM

## 2016-03-01 DIAGNOSIS — E860 Dehydration: Secondary | ICD-10-CM | POA: Diagnosis present

## 2016-03-01 DIAGNOSIS — E876 Hypokalemia: Secondary | ICD-10-CM | POA: Diagnosis present

## 2016-03-01 DIAGNOSIS — L0390 Cellulitis, unspecified: Secondary | ICD-10-CM | POA: Diagnosis present

## 2016-03-01 DIAGNOSIS — E1310 Other specified diabetes mellitus with ketoacidosis without coma: Secondary | ICD-10-CM | POA: Diagnosis present

## 2016-03-01 DIAGNOSIS — K219 Gastro-esophageal reflux disease without esophagitis: Secondary | ICD-10-CM | POA: Diagnosis present

## 2016-03-01 DIAGNOSIS — B029 Zoster without complications: Secondary | ICD-10-CM | POA: Diagnosis present

## 2016-03-01 DIAGNOSIS — Z7982 Long term (current) use of aspirin: Secondary | ICD-10-CM

## 2016-03-01 DIAGNOSIS — Z96652 Presence of left artificial knee joint: Secondary | ICD-10-CM | POA: Diagnosis present

## 2016-03-01 DIAGNOSIS — E1140 Type 2 diabetes mellitus with diabetic neuropathy, unspecified: Secondary | ICD-10-CM | POA: Diagnosis present

## 2016-03-01 DIAGNOSIS — Z885 Allergy status to narcotic agent status: Secondary | ICD-10-CM | POA: Diagnosis not present

## 2016-03-01 DIAGNOSIS — Z7984 Long term (current) use of oral hypoglycemic drugs: Secondary | ICD-10-CM | POA: Diagnosis not present

## 2016-03-01 LAB — CBC WITH DIFFERENTIAL/PLATELET
BASOS PCT: 0 %
Basophils Absolute: 0.1 10*3/uL (ref 0.0–0.1)
Eosinophils Absolute: 0 10*3/uL (ref 0.0–0.7)
Eosinophils Relative: 0 %
HEMATOCRIT: 42.5 % (ref 36.0–46.0)
HEMOGLOBIN: 14.7 g/dL (ref 12.0–15.0)
LYMPHS ABS: 1.2 10*3/uL (ref 0.7–4.0)
Lymphocytes Relative: 7 %
MCH: 30.1 pg (ref 26.0–34.0)
MCHC: 34.6 g/dL (ref 30.0–36.0)
MCV: 86.9 fL (ref 78.0–100.0)
MONOS PCT: 8 %
Monocytes Absolute: 1.3 10*3/uL — ABNORMAL HIGH (ref 0.1–1.0)
NEUTROS ABS: 14.3 10*3/uL — AB (ref 1.7–7.7)
NEUTROS PCT: 85 %
Platelets: 279 10*3/uL (ref 150–400)
RBC: 4.89 MIL/uL (ref 3.87–5.11)
RDW: 13.3 % (ref 11.5–15.5)
WBC: 16.9 10*3/uL — AB (ref 4.0–10.5)

## 2016-03-01 LAB — CBC
HEMATOCRIT: 34.1 % — AB (ref 36.0–46.0)
HEMOGLOBIN: 12 g/dL (ref 12.0–15.0)
MCH: 30.5 pg (ref 26.0–34.0)
MCHC: 35.2 g/dL (ref 30.0–36.0)
MCV: 86.8 fL (ref 78.0–100.0)
Platelets: 301 10*3/uL (ref 150–400)
RBC: 3.93 MIL/uL (ref 3.87–5.11)
RDW: 13.2 % (ref 11.5–15.5)
WBC: 16.9 10*3/uL — ABNORMAL HIGH (ref 4.0–10.5)

## 2016-03-01 LAB — BASIC METABOLIC PANEL
ANION GAP: 21 — AB (ref 5–15)
Anion gap: 14 (ref 5–15)
BUN: 28 mg/dL — ABNORMAL HIGH (ref 6–20)
BUN: 28 mg/dL — AB (ref 6–20)
CALCIUM: 9.2 mg/dL (ref 8.9–10.3)
CALCIUM: 8.8 mg/dL — AB (ref 8.9–10.3)
CHLORIDE: 96 mmol/L — AB (ref 101–111)
CHLORIDE: 100 mmol/L — AB (ref 101–111)
CO2: 17 mmol/L — AB (ref 22–32)
CO2: 23 mmol/L (ref 22–32)
CREATININE: 1.33 mg/dL — AB (ref 0.44–1.00)
Creatinine, Ser: 1.72 mg/dL — ABNORMAL HIGH (ref 0.44–1.00)
GFR calc non Af Amer: 26 mL/min — ABNORMAL LOW (ref >60)
GFR calc non Af Amer: 35 mL/min — ABNORMAL LOW (ref >60)
GFR, EST AFRICAN AMERICAN: 30 mL/min — AB (ref >60)
GFR, EST AFRICAN AMERICAN: 41 mL/min — AB (ref >60)
GLUCOSE: 114 mg/dL — AB (ref 65–99)
Glucose, Bld: 412 mg/dL — ABNORMAL HIGH (ref 65–99)
Potassium: 3.9 mmol/L (ref 3.5–5.1)
Potassium: 3.7 mmol/L (ref 3.5–5.1)
Sodium: 134 mmol/L — ABNORMAL LOW (ref 135–145)
Sodium: 137 mmol/L (ref 135–145)

## 2016-03-01 LAB — GLUCOSE, CAPILLARY: Glucose-Capillary: 174 mg/dL — ABNORMAL HIGH (ref 65–99)

## 2016-03-01 LAB — CBG MONITORING, ED
GLUCOSE-CAPILLARY: 338 mg/dL — AB (ref 65–99)
GLUCOSE-CAPILLARY: 320 mg/dL — AB (ref 65–99)

## 2016-03-01 MED ORDER — INSULIN ASPART 100 UNIT/ML ~~LOC~~ SOLN
10.0000 [IU] | Freq: Once | SUBCUTANEOUS | Status: AC
  Administered 2016-03-01: 10 [IU] via SUBCUTANEOUS
  Filled 2016-03-01: qty 1

## 2016-03-01 MED ORDER — VANCOMYCIN HCL IN DEXTROSE 1-5 GM/200ML-% IV SOLN
1000.0000 mg | Freq: Once | INTRAVENOUS | Status: AC
  Administered 2016-03-01: 1000 mg via INTRAVENOUS
  Filled 2016-03-01: qty 200

## 2016-03-01 MED ORDER — SODIUM CHLORIDE 0.9 % IV SOLN
500.0000 mg | INTRAVENOUS | Status: DC
Start: 2016-03-02 — End: 2016-03-02
  Filled 2016-03-01: qty 500

## 2016-03-01 MED ORDER — HYDROCODONE-ACETAMINOPHEN 5-325 MG PO TABS
1.0000 | ORAL_TABLET | Freq: Four times a day (QID) | ORAL | Status: DC | PRN
  Administered 2016-03-01 – 2016-03-05 (×8): 1 via ORAL
  Filled 2016-03-01 (×10): qty 1

## 2016-03-01 MED ORDER — INSULIN ASPART 100 UNIT/ML ~~LOC~~ SOLN
0.0000 [IU] | Freq: Three times a day (TID) | SUBCUTANEOUS | Status: DC
Start: 2016-03-02 — End: 2016-03-07
  Administered 2016-03-02: 2 [IU] via SUBCUTANEOUS
  Administered 2016-03-02 (×2): 3 [IU] via SUBCUTANEOUS
  Administered 2016-03-03 – 2016-03-04 (×3): 2 [IU] via SUBCUTANEOUS
  Administered 2016-03-05 (×2): 3 [IU] via SUBCUTANEOUS
  Administered 2016-03-05 – 2016-03-07 (×4): 2 [IU] via SUBCUTANEOUS

## 2016-03-01 MED ORDER — SODIUM CHLORIDE 0.9 % IV BOLUS (SEPSIS)
1000.0000 mL | Freq: Once | INTRAVENOUS | Status: AC
  Administered 2016-03-01: 1000 mL via INTRAVENOUS

## 2016-03-01 MED ORDER — HYDROCODONE-ACETAMINOPHEN 5-325 MG PO TABS
1.0000 | ORAL_TABLET | Freq: Once | ORAL | Status: AC
  Administered 2016-03-01: 1 via ORAL
  Filled 2016-03-01: qty 1

## 2016-03-01 MED ORDER — ENOXAPARIN SODIUM 40 MG/0.4ML ~~LOC~~ SOLN
40.0000 mg | SUBCUTANEOUS | Status: DC
  Administered 2016-03-01 – 2016-03-06 (×6): 40 mg via SUBCUTANEOUS
  Filled 2016-03-01 (×6): qty 0.4

## 2016-03-01 MED ORDER — HYDROMORPHONE HCL 1 MG/ML IJ SOLN
0.5000 mg | Freq: Four times a day (QID) | INTRAMUSCULAR | Status: AC | PRN
Start: 2016-03-01 — End: 2016-03-02
  Administered 2016-03-02 (×2): 0.5 mg via INTRAVENOUS
  Filled 2016-03-01 (×2): qty 1

## 2016-03-01 MED ORDER — PIPERACILLIN-TAZOBACTAM 3.375 G IVPB 30 MIN
3.3750 g | Freq: Once | INTRAVENOUS | Status: AC
  Administered 2016-03-01: 3.375 g via INTRAVENOUS
  Filled 2016-03-01: qty 50

## 2016-03-01 MED ORDER — ASPIRIN EC 81 MG PO TBEC
81.0000 mg | DELAYED_RELEASE_TABLET | Freq: Every day | ORAL | Status: DC
Start: 2016-03-02 — End: 2016-03-07
  Administered 2016-03-02 – 2016-03-07 (×6): 81 mg via ORAL
  Filled 2016-03-01 (×6): qty 1

## 2016-03-01 MED ORDER — PIPERACILLIN-TAZOBACTAM 3.375 G IVPB
3.3750 g | Freq: Three times a day (TID) | INTRAVENOUS | Status: DC
  Administered 2016-03-02 – 2016-03-07 (×17): 3.375 g via INTRAVENOUS
  Filled 2016-03-01 (×19): qty 50

## 2016-03-01 MED ORDER — HYDROCODONE-ACETAMINOPHEN 5-325 MG PO TABS: 1.0000 | ORAL_TABLET | ORAL | Status: DC | PRN

## 2016-03-01 MED ORDER — PANTOPRAZOLE SODIUM 40 MG PO TBEC
40.0000 mg | DELAYED_RELEASE_TABLET | Freq: Every day | ORAL | Status: DC
  Administered 2016-03-02 – 2016-03-07 (×6): 40 mg via ORAL
  Filled 2016-03-01 (×7): qty 1

## 2016-03-01 MED ORDER — SODIUM CHLORIDE 0.9 % IV SOLN: INTRAVENOUS | Status: DC

## 2016-03-01 NOTE — ED Provider Notes (Signed)
CSN: [id number]     Arrival date & time 03/01/16  1545 History   First MD Initiated Contact with Patient 03/01/16 1615     Chief Complaint  Patient presents with  . Cellulitis   Patient gave verbal permission to utilize photo for medical documentation only The image was not stored on any personal device  Patient is a 82 y.o. female presenting with lower extremity pain. The history is provided by the patient.  Foot Pain This is a new problem. The current episode started more than 1 week ago. The problem occurs daily. The problem has been gradually worsening. Pertinent negatives include no chest pain, no abdominal pain and no headaches. The symptoms are aggravated by walking. The symptoms are relieved by rest.  Patient with h/o HTN, arthritis and diabetes presents with left foot pain/redness about 2 weeks ago No trauma but does report stepping on toothpick recently and she had to extract it She reports chills but no fevers.   She reports recently treated for gout but it did not improve  Past Medical History  Diagnosis Date  . Osteoarthritis   . Degenerative joint disease   . Lumbar spinal stenosis   . Diabetes mellitus, type 2 ([place])   . Hypertension   . Gout   . GERD (gastroesophageal reflux disease)   . Fundic gland polyps of stomach, benign   . Helicobacter pylori gastritis   . Shingles     current shingles- x4 yrs., on L side of face     . Cancer (HCC)     melanoma- R arm , area excised   . Tubular adenoma of colon 07/2011  . Hiatal hernia    Past Surgical History  Procedure Laterality Date  . Total knee arthroplasty      left  . Lumbar laminectomy      6 prior back surgery   . Cholecystectomy  1998  . Vaginal hysterectomy  1990's  . Cataract extraction      bilateral, /w IOL  . Retinal laser procedure      bilateral  . Rotator cuff repair      bilateral  . Carpal tunnel release      bilateral  . Hemorrhoid surgery      x 2  . Tonsillectomy    . Benign br.  biopsy- many yrs. ago    . Spinal fusion  08/22/2011    Procedure: FUSION POSTERIOR SPINAL MULTILEVEL/SCOLIOSIS;  Surgeon: Gunnar Bulla;  Location: [facility];  Service: Orthopedics;  Laterality: N/A;  LUMBAR LAMINECTOMY, EXTENSION OF POSTERIOR LUMBAR FUSION TO T10 WITH K2M RODS, SCREWS, CONNECTORS, HOOKS, ILIAC CREST BONE GRAFT   Family History  Problem Relation Age of Onset  . Colon cancer Neg Hx   . Anesthesia problems Neg Hx   . Hypotension Neg Hx   . Malignant hyperthermia Neg Hx   . Pseudochol deficiency Neg Hx   . Diabetes Mother   . Cancer Mother     mother  . Diabetes Paternal Aunt     x 1  . Diabetes Paternal Uncle     x 3  . Diabetes Brother   . Diabetes Other     neice  . Heart disease Father   . Cancer Father     gallbladder  . Liver cancer Father   . Heart disease Brother   . Colon polyps Sister    Social History  Substance Use Topics  . Smoking status: Never Smoker   . Smokeless tobacco: Never  Used  . Alcohol Use: No   OB History    No data available     Review of Systems  Constitutional: Positive for chills.  Cardiovascular: Negative for chest pain.  Gastrointestinal: Negative for abdominal pain.  Musculoskeletal: Positive for arthralgias.  Skin: Positive for rash.  Neurological: Negative for headaches.  All other systems reviewed and are negative.     Allergies  Celebrex; Morphine sulfate; and Percocet  Home Medications   Prior to Admission medications   Medication Sig Start Date End Date Taking? Authorizing Provider  amLODipine (NORVASC) 5 MG tablet Take 5 mg by mouth daily.     Yes Historical Provider, MD  aspirin EC 81 MG tablet Take 81 mg by mouth daily.   Yes Historical Provider, MD  baclofen (LIORESAL) 10 MG tablet  01/26/16  Yes Historical Provider, MD  calcium carbonate (OS-CAL) 600 MG TABS tablet Take 600 mg by mouth daily with breakfast.   Yes Historical Provider, MD  gabapentin (NEURONTIN) 100 MG capsule  01/26/16  Yes Historical  Provider, MD  glimepiride (AMARYL) 4 MG tablet Take 4 mg by mouth 2 (two) times daily.   Yes Historical Provider, MD  HYDROcodone-acetaminophen (NORCO/VICODIN) 5-325 MG per tablet Take 1 tablet by mouth every 6 (six) hours as needed for pain.   Yes Historical Provider, MD  metFORMIN (GLUCOPHAGE) 500 MG tablet Take 500 mg by mouth 4 (four) times daily.    Yes Historical Provider, MD  omeprazole (PRILOSEC) 20 MG capsule Take 20 mg by mouth daily.   Yes Historical Provider, MD  Polyethyl Glycol-Propyl Glycol (SYSTANE OP) Apply 1 drop to eye daily as needed. For dry eyes   Yes Historical Provider, MD  ramipril (ALTACE) 10 MG capsule Take 10 mg by mouth daily.     Yes Historical Provider, MD  BAYER CONTOUR TEST test strip TEST ONCE D UTD 01/12/16   Historical Provider, MD   BP 119/61 mmHg  Pulse 107  Temp(Src) 99 F (37.2 C) (Oral)  Resp 18  SpO2 96% Physical Exam CONSTITUTIONAL: Well developed/well nourished HEAD: Normocephalic/atraumatic EYES: EOMI ENMT: Mucous membranes moist NECK: supple no meningeal signs SPINE/BACK:entire spine nontender CV: S1/S2 noted, no murmurs/rubs/gallops noted LUNGS: Lungs are clear to auscultation bilaterally, no apparent distress ABDOMEN: soft, nontender NEURO: Pt is awake/alert/appropriate, moves all extremitiesx4.  No facial droop.   EXTREMITIES: pulses normal/equal, full ROM, no crepitus or drainage to left foot. See photo SKIN: warm, color normal PSYCH: no abnormalities of mood noted, alert and oriented to situation       ED Course  Procedures  Medications  HYDROcodone-acetaminophen (NORCO/VICODIN) 5-325 MG per tablet 1 tablet (1 tablet Oral Given 03/01/16 1640)  piperacillin-tazobactam (ZOSYN) IVPB 3.375 g (0 g Intravenous Stopped 03/01/16 1727)  vancomycin (VANCOCIN) IVPB 1000 mg/200 mL premix (0 mg Intravenous Stopped 03/01/16 1838)  sodium chloride 0.9 % bolus 1,000 mL (1,000 mLs Intravenous New Bag/Given 03/01/16 1744)  insulin aspart (novoLOG)  injection 10 Units (10 Units Subcutaneous Given 03/01/16 1817)    Labs Review Labs Reviewed  BASIC METABOLIC PANEL - Abnormal; Notable for the following:    Sodium 134 (*)    Chloride 96 (*)    CO2 17 (*)    Glucose, Bld 412 (*)    BUN 28 (*)    Creatinine, Ser 1.72 (*)    GFR calc non Af Amer 26 (*)    GFR calc Af Amer 30 (*)    Anion gap 21 (*)    All other components  within normal limits  CBC WITH DIFFERENTIAL/PLATELET - Abnormal; Notable for the following:    WBC 16.9 (*)    Neutro Abs 14.3 (*)    Monocytes Absolute 1.3 (*)    All other components within normal limits  CBG MONITORING, ED - Abnormal; Notable for the following:    Glucose-Capillary 338 (*)    All other components within normal limits    Imaging Review Dg Foot Complete Left  03/01/2016  CLINICAL DATA:  LEFT foot redness and swelling which has progressively gotten worse over last 2 weeks, pain, history type II diabetes mellitus, hypertension EXAM: LEFT FOOT - COMPLETE 3+ VIEW COMPARISON:  10/21/2015 FINDINGS: Osseous demineralization. Diffuse soft tissue swelling LEFT foot greatest at great toe. Small juxta-articular calcifications at the first MTP joint appear unchanged question degenerative. Soft tissue calcifications at the medial aspect of the LEFT midfoot, uncertain etiology, unchanged. No acute fracture, dislocation, or bone destruction. No definite radiopaque foreign body or soft tissue gas. IMPRESSION: Significant soft tissue swelling without acute bony abnormalities. Electronically Signed   By: Lavonia Dana M.D.   On: 03/01/2016 17:29   I have personally reviewed and evaluated these images and lab results as part of my medical decision-making.   6:50 PM Pt will require admission for IV antibiotics She is stable  Pt with worsening renal insufficieny as well as hyperglycemia though mild DKA but will likely resolve with IV fluids D/w dr Darrick Meigs for admission  MDM   Final diagnoses:  Cellulitis of left foot    Hyperglycemia  Dehydration  AKI (acute kidney injury) [facility])    Nursing notes including past medical history and social history reviewed and considered in documentation Labs/vital reviewed myself and considered during evaluation xrays/imaging reviewed by myself and considered during evaluation     Ripley Fraise, MD 03/01/16 1851

## 2016-03-01 NOTE — ED Notes (Signed)
Pt reports left foot redness and swelling which has progressively gotten worse over the last two weeks. Pt alert x4. NAD at this time.

## 2016-03-01 NOTE — ED Notes (Signed)
Notified admit Doctor of CBG 320.  Ordered additional insulin.

## 2016-03-01 NOTE — Progress Notes (Signed)
Pharmacy Antibiotic Note  [name] is a 82 y.o. female admitted on 03/01/2016 with cellulitis.  Pharmacy has been consulted for vancomycin and zosyn dosing. Pt presents with redness, pain and swelling of LLE.  Pt received vancomycin 1g and zosyn 3.375g IV once in the ED.  Plan: Vancomycin 500mg  IV every 24 hours.  Goal trough 10-15 mcg/mL. Zosyn 3.375g IV q8h (4 hour infusion).  Monitor culture data, renal function and clinical course VT at SS prn     Temp (24hrs), Avg:99 F (37.2 C), Min:99 F (37.2 C), Max:99 F (37.2 C)   Recent Labs Lab 03/01/16 1634  WBC 16.9*  CREATININE 1.72*    CrCl cannot be calculated (Unknown ideal weight.).    Allergies  Allergen Reactions  . Celebrex [Celecoxib] Swelling  . Morphine Sulfate Nausea Only  . Percocet [Oxycodone-Acetaminophen] Itching    Antimicrobials this admission: Vanc 5/17 >>  Zosyn 5/17 >>   Dose adjustments this admission: n/a  Microbiology results:  BCx:   UCx:    Sputum:    MRSA PCR:    Andrey Cota. Diona Foley, PharmD, [place] Clinical Pharmacist Pager [phone] 03/01/2016 7:52 PM

## 2016-03-01 NOTE — ED Notes (Signed)
Spoke with EDP obtaining blood cultures prior to IV antibiotics. No blood cultures needed.

## 2016-03-01 NOTE — ED Notes (Signed)
Joffre called to inform this RN that this patient was on her way here to the ED to be seen for cellulitis to her left leg and foot, possible osteomyelitis and thinks she may need IV abx. They stated she had stepped on a toothpick but was unaware until 3 days later. HR at [facility] was 114, no fever, last Tdap was in 2010.
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
## 2016-03-01 NOTE — ED Notes (Signed)
Patient given food tray

## 2016-03-01 NOTE — ED Notes (Signed)
Admit doctor at bedside

## 2016-03-02 DIAGNOSIS — E119 Type 2 diabetes mellitus without complications: Secondary | ICD-10-CM

## 2016-03-02 LAB — GLUCOSE, CAPILLARY
GLUCOSE-CAPILLARY: 207 mg/dL — AB (ref 65–99)
GLUCOSE-CAPILLARY: 241 mg/dL — AB (ref 65–99)
Glucose-Capillary: 159 mg/dL — ABNORMAL HIGH (ref 65–99)
Glucose-Capillary: 254 mg/dL — ABNORMAL HIGH (ref 65–99)

## 2016-03-02 LAB — COMPREHENSIVE METABOLIC PANEL
ALT: 25 U/L (ref 14–54)
ANION GAP: 16 — AB (ref 5–15)
AST: 55 U/L — ABNORMAL HIGH (ref 15–41)
Albumin: 2.2 g/dL — ABNORMAL LOW (ref 3.5–5.0)
Alkaline Phosphatase: 75 U/L (ref 38–126)
BUN: 21 mg/dL — ABNORMAL HIGH (ref 6–20)
CHLORIDE: 100 mmol/L — AB (ref 101–111)
CO2: 22 mmol/L (ref 22–32)
Calcium: 8.7 mg/dL — ABNORMAL LOW (ref 8.9–10.3)
Creatinine, Ser: 1.09 mg/dL — ABNORMAL HIGH (ref 0.44–1.00)
GFR calc non Af Amer: 45 mL/min — ABNORMAL LOW (ref >60)
GFR, EST AFRICAN AMERICAN: 52 mL/min — AB (ref >60)
Glucose, Bld: 162 mg/dL — ABNORMAL HIGH (ref 65–99)
Potassium: 3.3 mmol/L — ABNORMAL LOW (ref 3.5–5.1)
SODIUM: 138 mmol/L (ref 135–145)
Total Bilirubin: 0.9 mg/dL (ref 0.3–1.2)
Total Protein: 6.1 g/dL — ABNORMAL LOW (ref 6.5–8.1)

## 2016-03-02 LAB — CBC
HCT: 33.6 % — ABNORMAL LOW (ref 36.0–46.0)
Hemoglobin: 11.1 g/dL — ABNORMAL LOW (ref 12.0–15.0)
MCH: 28.9 pg (ref 26.0–34.0)
MCHC: 33 g/dL (ref 30.0–36.0)
MCV: 87.5 fL (ref 78.0–100.0)
PLATELETS: 284 10*3/uL (ref 150–400)
RBC: 3.84 MIL/uL — AB (ref 3.87–5.11)
RDW: 13.4 % (ref 11.5–15.5)
WBC: 15.5 10*3/uL — ABNORMAL HIGH (ref 4.0–10.5)

## 2016-03-02 MED ORDER — VANCOMYCIN HCL IN DEXTROSE 750-5 MG/150ML-% IV SOLN
750.0000 mg | INTRAVENOUS | Status: DC
  Administered 2016-03-02 – 2016-03-05 (×4): 750 mg via INTRAVENOUS
  Filled 2016-03-02 (×6): qty 150

## 2016-03-02 MED ORDER — POTASSIUM CHLORIDE CRYS ER 20 MEQ PO TBCR
40.0000 meq | EXTENDED_RELEASE_TABLET | Freq: Two times a day (BID) | ORAL | Status: AC
Start: 2016-03-02 — End: 2016-03-02
  Administered 2016-03-02 (×2): 40 meq via ORAL
  Filled 2016-03-02 (×2): qty 2

## 2016-03-02 MED ORDER — SODIUM CHLORIDE 0.45 % IV SOLN
INTRAVENOUS | Status: DC
  Administered 2016-03-02 – 2016-03-05 (×2): via INTRAVENOUS
  Filled 2016-03-02 (×11): qty 1000

## 2016-03-02 NOTE — Progress Notes (Signed)
[facility]  PROGRESS NOTE  [name] [id number] DOB: [birth year] DOA: 03/01/2016 PCP: Jani Gravel, MD  Outpatient specialist:  Brief HPI:   Active Problems:   Diabetes mellitus ([place])   Cellulitis of left lower extremity   Cellulitis   Assessment/Plan:  1. Cellulitis of left foot-improving with antibiotics, patient started on vancomycin and Zosyn. Will continue antibiotics per pharmacy consultation.  2. Mild DKA- patient presented with mild DKA with AG of 17. Blood glucose was 412 and repeat blood sugar was 338. Patient given an units of subcutaneous NovoLog in the ED. at this time DKA has resolved. Bicarbonate is 22, AG  is still 16. Will follow BMP in a.m. 3. Hypokalemia- replace potassium and check BMP in a.m. 4. Diabetes mellitus- continue sliding scale insulin with NovoLog. Blood glucose well controlled. 5. Hypertension: blood pressure is in low 100's. Will hold ramipril at this time. 6. Acute kidney injury-resolved, creatinine 1.09 patient's BUN/creatinine on admission was 28/1.72.  DVT prophylaxis: Lovenox Code Status: Full code Family Communication: No family present at bedside Disposition Plan: Pending improvement in left foot cellulitis   Consultants:  None  Procedures:  None  Antibiotics:  Vancomycin  Zosyn   Subjective: Patient seen and examined, left foot redness and swelling has improved with antibiotics. Pain is well controlled. Mild DKA has resolved.  Objective: Filed Vitals:   03/01/16 1745 03/01/16 2131 03/02/16 0602 03/02/16 1329  BP: 120/56 128/52 122/65 125/55  Pulse: 105 103 95 91  Temp:  98.3 F (36.8 C) 99.5 F (37.5 C) 98.3 F (36.8 C)  TempSrc:   Oral   Resp: 18 17 19 18   SpO2: 95% 96% 92% 95%    Intake/Output Summary (Last 24 hours) at 03/02/16 1350 Last data filed at 03/02/16 0650  Gross per 24 hour  Intake    900 ml  Output      0 ml  Net    900 ml   There were no vitals filed for this  visit.  Examination:  General exam: Appears calm and comfortable  Respiratory system: Clear to auscultation. Respiratory effort normal. Cardiovascular system: S1 & S2 heard, RRR. No JVD, murmurs, rubs, gallops or clicks. No pedal edema. Gastrointestinal system: Abdomen is nondistended, soft and nontender. No organomegaly or masses felt. Normal bowel sounds heard. Central nervous system: Alert and oriented. No focal neurological deficits. Extremities: Symmetric 5 x 5 power. Skin: Erythema improving in left foot. Large bulla noted on the ventral aspect of the first metatarsophalangeal joint. Psychiatry: Judgement and insight appear normal. Mood & affect appropriate.    Data Reviewed: I have personally reviewed following labs and imaging studies Basic Metabolic Panel:  Recent Labs Lab 03/01/16 1634 03/01/16 2234 03/02/16 0727  NA 134* 137 138  K 3.9 3.7 3.3*  CL 96* 100* 100*  CO2 17* 23 22  GLUCOSE 412* 114* 162*  BUN 28* 28* 21*  CREATININE 1.72* 1.33* 1.09*  CALCIUM 9.2 8.8* 8.7*   Liver Function Tests:  Recent Labs Lab 03/02/16 0727  AST 55*  ALT 25  ALKPHOS 75  BILITOT 0.9  PROT 6.1*  ALBUMIN 2.2*   No results for input(s): LIPASE, AMYLASE in the last 168 hours. No results for input(s): AMMONIA in the last 168 hours. CBC:  Recent Labs Lab 03/01/16 1634 03/01/16 2234 03/02/16 0727  WBC 16.9* 16.9* 15.5*  NEUTROABS 14.3*  --   --   HGB 14.7 12.0 11.1*  HCT 42.5 34.1* 33.6*  MCV 86.9 86.8 87.5  PLT  279 301 284   Cardiac Enzymes: No results for input(s): CKTOTAL, CKMB, CKMBINDEX, TROPONINI in the last 168 hours. BNP (last 3 results) No results for input(s): BNP in the last 8760 hours.  ProBNP (last 3 results) No results for input(s): PROBNP in the last 8760 hours.  CBG:  Recent Labs Lab 03/01/16 1808 03/01/16 1917 03/01/16 2130 03/02/16 0607 03/02/16 1129  GLUCAP 338* 320* 174* 159* 207*    Recent Results (from the past 240 hour(s))   Culture, blood (Routine X 2) w Reflex to ID Panel     Status: None (Preliminary result)   Collection Time: 03/01/16  7:25 PM  Result Value Ref Range Status   Specimen Description BLOOD RIGHT ANTECUBITAL  Final   Special Requests BOTTLES DRAWN AEROBIC AND ANAEROBIC 10CC  Final   Culture NO GROWTH < 24 HOURS  Final   Report Status PENDING  Incomplete  Culture, blood (Routine X 2) w Reflex to ID Panel     Status: None (Preliminary result)   Collection Time: 03/01/16  7:30 PM  Result Value Ref Range Status   Specimen Description BLOOD RIGHT HAND  Final   Special Requests BOTTLES DRAWN AEROBIC ONLY 5CC  Final   Culture NO GROWTH < 24 HOURS  Final   Report Status PENDING  Incomplete     Studies: Dg Foot Complete Left  03/01/2016  CLINICAL DATA:  LEFT foot redness and swelling which has progressively gotten worse over last 2 weeks, pain, history type II diabetes mellitus, hypertension EXAM: LEFT FOOT - COMPLETE 3+ VIEW COMPARISON:  10/21/2015 FINDINGS: Osseous demineralization. Diffuse soft tissue swelling LEFT foot greatest at great toe. Small juxta-articular calcifications at the first MTP joint appear unchanged question degenerative. Soft tissue calcifications at the medial aspect of the LEFT midfoot, uncertain etiology, unchanged. No acute fracture, dislocation, or bone destruction. No definite radiopaque foreign body or soft tissue gas. IMPRESSION: Significant soft tissue swelling without acute bony abnormalities. Electronically Signed   By: Lavonia Dana M.D.   On: 03/01/2016 17:29    Scheduled Meds: . aspirin EC  81 mg Oral Daily  . enoxaparin (LOVENOX) injection  40 mg Subcutaneous Q24H  . insulin aspart  0-9 Units Subcutaneous TID WC  . pantoprazole  40 mg Oral Daily  . piperacillin-tazobactam (ZOSYN)  IV  3.375 g Intravenous Q8H  . potassium chloride  40 mEq Oral BID  . vancomycin  750 mg Intravenous Q24H   Continuous Infusions: . sodium chloride 0.45 % 1,000 mL infusion 75 mL/hr at  03/02/16 1033       Time spent: 25 min    [facility] Pager [phone]. If 7PM-7AM, please contact night-coverage at [email or web address], Office  [phone]  password [email or web address] 03/02/2016, 1:50 PM  LOS: 1 day

## 2016-03-02 NOTE — Progress Notes (Signed)
Pharmacy Antibiotic Note  [name] is a 82 y.o. female admitted on 03/01/2016 with cellulitis.  Pharmacy has been consulted for vancomycin and zosyn dosing. Pt presents with redness, pain and swelling of LLE.  Pt received vancomycin 1g and zosyn 3.375g IV once in the ED.  Overnight SCr has improved.  Will adjust maintenance dose of Vancomycin.  Plan: Vancomycin 750mg  IV every 24 hours.  Goal trough 10-15 mcg/mL. Zosyn 3.375g IV q8h (4 hour infusion).  Monitor culture data, renal function and clinical course VT at SS prn     Temp (24hrs), Avg:98.9 F (37.2 C), Min:98.3 F (36.8 C), Max:99.5 F (37.5 C)   Recent Labs Lab 03/01/16 1634 03/01/16 2234 03/02/16 0727  WBC 16.9* 16.9* 15.5*  CREATININE 1.72* 1.33* 1.09*    CrCl cannot be calculated (Unknown ideal weight.).    Allergies  Allergen Reactions  . Celebrex [Celecoxib] Swelling  . Morphine Sulfate Nausea Only  . Percocet [Oxycodone-Acetaminophen] Itching    Antimicrobials this admission: Vanc 5/17 >>  Zosyn 5/17 >>   Dose adjustments this admission: n/a  Microbiology results:  BCx:   UCx:    Sputum:    MRSA PCR:    Horton Chin, Pharm.D., BCPS Clinical Pharmacist Pager [phone] 03/02/2016 11:22 AM

## 2016-03-02 NOTE — Progress Notes (Signed)
Inpatient Diabetes Program Recommendations  AACE/ADA: New Consensus Statement on Inpatient Glycemic Control (2015)  Target Ranges:  Prepandial:   less than 140 mg/dL      Peak postprandial:   less than 180 mg/dL (1-2 hours)      Critically ill patients:  140 - 180 mg/dL  Results for [name], [name] (MRN [id number]) as of 03/02/2016 12:31  Ref. Range 03/01/2016 18:08 03/01/2016 19:17 03/01/2016 21:30 03/02/2016 06:07 03/02/2016 11:29  Glucose-Capillary Latest Ref Range: 65-99 mg/dL 338 (H) 320 (H) 174 (H) 159 (H) 207 (H)   Review of Glycemic Control  Diabetes history: DM 2 Outpatient Diabetes medications: Amaryl 4 mg BID, Metformin 500 mg 4x/day Current orders for Inpatient glycemic control: Novolog Sensitive  Inpatient Diabetes Program Recommendations:  Correction (SSI): Glucose increasing into the 200's. Please consider increasing correction to Novolog Moderate Correction TID and add HS coverage.  Thanks,  Tama Headings RN, MSN, [facility] Inpatient Diabetes Coordinator Team Pager [phone] (8a-5p)

## 2016-03-03 ENCOUNTER — Inpatient Hospital Stay (HOSPITAL_COMMUNITY): Payer: Medicare Other

## 2016-03-03 LAB — BASIC METABOLIC PANEL
Anion gap: 12 (ref 5–15)
BUN: 13 mg/dL (ref 6–20)
CALCIUM: 8.8 mg/dL — AB (ref 8.9–10.3)
CO2: 24 mmol/L (ref 22–32)
CREATININE: 0.75 mg/dL (ref 0.44–1.00)
Chloride: 101 mmol/L (ref 101–111)
GFR calc non Af Amer: >60 mL/min (ref >60)
Glucose, Bld: 176 mg/dL — ABNORMAL HIGH (ref 65–99)
Potassium: 4 mmol/L (ref 3.5–5.1)
SODIUM: 137 mmol/L (ref 135–145)

## 2016-03-03 LAB — GLUCOSE, CAPILLARY
GLUCOSE-CAPILLARY: 176 mg/dL — AB (ref 65–99)
GLUCOSE-CAPILLARY: 191 mg/dL — AB (ref 65–99)
Glucose-Capillary: 143 mg/dL — ABNORMAL HIGH (ref 65–99)
Glucose-Capillary: 159 mg/dL — ABNORMAL HIGH (ref 65–99)

## 2016-03-03 LAB — CBC
HCT: 31.8 % — ABNORMAL LOW (ref 36.0–46.0)
Hemoglobin: 10.5 g/dL — ABNORMAL LOW (ref 12.0–15.0)
MCH: 28.7 pg (ref 26.0–34.0)
MCHC: 33 g/dL (ref 30.0–36.0)
MCV: 86.9 fL (ref 78.0–100.0)
Platelets: 307 10*3/uL (ref 150–400)
RBC: 3.66 MIL/uL — ABNORMAL LOW (ref 3.87–5.11)
RDW: 13.4 % (ref 11.5–15.5)
WBC: 15.1 10*3/uL — ABNORMAL HIGH (ref 4.0–10.5)

## 2016-03-03 LAB — HEMOGLOBIN A1C
HEMOGLOBIN A1C: 8.4 % — AB (ref 4.8–5.6)
MEAN PLASMA GLUCOSE: 194 mg/dL

## 2016-03-03 NOTE — Progress Notes (Signed)
[facility]  PROGRESS NOTE  [name] [id number] DOB: Mar 11, 1928 DOA: 03/01/2016 PCP: Jani Gravel, MD  Outpatient specialist:  Brief HPI:   Active Problems:   Diabetes mellitus ([place])   Cellulitis of left lower extremity   Cellulitis   Assessment/Plan:  1. Cellulitis of left foot-improving with antibiotics, patient started on vancomycin and Zosyn. Will continue antibiotics per pharmacy consultation.  2. Blister on foot-  Will consult orthopedics for draining the blister. 3. Mild DKA- patient presented with mild DKA with AG of 17. Blood glucose was 412 and repeat blood sugar was 338. Patient given an units of subcutaneous NovoLog in the ED. at this time DKA has resolved. Bicarbonate is 22, AG  is still 16. Will follow BMP in a.m. 4. Hypokalemia- replaced potassium and check BMP in a.m. 5. Diabetes mellitus- continue sliding scale insulin with NovoLog. Blood glucose well controlled. 6. Hypertension: blood pressure is in low 100's. Will hold ramipril at this time. 7. Acute kidney injury-resolved, creatinine 1.09 patient's BUN/creatinine on admission was 28/1.72.  DVT prophylaxis: Lovenox Code Status: Full code Family Communication: No family present at bedside Disposition Plan: Pending improvement in left foot cellulitis   Consultants:  None  Procedures:  None  Antibiotics:  Vancomycin  Zosyn   Subjective: Patient seen and examined, left foot redness and swelling has improved with antibiotics. Pain is well controlled. Mild DKA has resolved.  Objective: Filed Vitals:   03/02/16 0602 03/02/16 1329 03/02/16 2035 03/03/16 0449  BP: 122/65 125/55 147/55 128/62  Pulse: 95 91 100 98  Temp: 99.5 F (37.5 C) 98.3 F (36.8 C) 98.4 F (36.9 C) 98.2 F (36.8 C)  TempSrc: Oral  Oral Oral  Resp: 19 18 18 16   SpO2: 92% 95% 97% 98%    Intake/Output Summary (Last 24 hours) at 03/03/16 1352 Last data filed at 03/03/16 1200  Gross per 24 hour  Intake   1890  ml  Output      0 ml  Net   1890 ml   There were no vitals filed for this visit.  Examination:  General exam: Appears calm and comfortable  Respiratory system: Clear to auscultation. Respiratory effort normal. Cardiovascular system: S1 & S2 heard, RRR. No JVD, murmurs, rubs, gallops or clicks. No pedal edema. Gastrointestinal system: Abdomen is nondistended, soft and nontender. No organomegaly or masses felt. Normal bowel sounds heard. Central nervous system: Alert and oriented. No focal neurological deficits. Extremities: Symmetric 5 x 5 power. Skin: Erythema improving in left foot. Large blister  noted on the ventral aspect of the first metatarsophalangeal joint. Psychiatry: Judgement and insight appear normal. Mood & affect appropriate.    Data Reviewed: I have personally reviewed following labs and imaging studies Basic Metabolic Panel:  Recent Labs Lab 03/01/16 1634 03/01/16 2234 03/02/16 0727 03/03/16 0627  NA 134* 137 138 137  K 3.9 3.7 3.3* 4.0  CL 96* 100* 100* 101  CO2 17* 23 22 24   GLUCOSE 412* 114* 162* 176*  BUN 28* 28* 21* 13  CREATININE 1.72* 1.33* 1.09* 0.75  CALCIUM 9.2 8.8* 8.7* 8.8*   Liver Function Tests:  Recent Labs Lab 03/02/16 0727  AST 55*  ALT 25  ALKPHOS 75  BILITOT 0.9  PROT 6.1*  ALBUMIN 2.2*   No results for input(s): LIPASE, AMYLASE in the last 168 hours. No results for input(s): AMMONIA in the last 168 hours. CBC:  Recent Labs Lab 03/01/16 1634 03/01/16 2234 03/02/16 0727 03/03/16 0627  WBC 16.9* 16.9* 15.5*  15.1*  NEUTROABS 14.3*  --   --   --   HGB 14.7 12.0 11.1* 10.5*  HCT 42.5 34.1* 33.6* 31.8*  MCV 86.9 86.8 87.5 86.9  PLT 279 301 284 307   Cardiac Enzymes: No results for input(s): CKTOTAL, CKMB, CKMBINDEX, TROPONINI in the last 168 hours. BNP (last 3 results) No results for input(s): BNP in the last 8760 hours.  ProBNP (last 3 results) No results for input(s): PROBNP in the last 8760 hours.  CBG:  Recent  Labs Lab 03/02/16 1129 03/02/16 1608 03/02/16 2155 03/03/16 0631 03/03/16 1140  GLUCAP 207* 241* 254* 176* 191*    Recent Results (from the past 240 hour(s))  Culture, blood (Routine X 2) w Reflex to ID Panel     Status: None (Preliminary result)   Collection Time: 03/01/16  7:25 PM  Result Value Ref Range Status   Specimen Description BLOOD RIGHT ANTECUBITAL  Final   Special Requests BOTTLES DRAWN AEROBIC AND ANAEROBIC 10CC  Final   Culture NO GROWTH < 24 HOURS  Final   Report Status PENDING  Incomplete  Culture, blood (Routine X 2) w Reflex to ID Panel     Status: None (Preliminary result)   Collection Time: 03/01/16  7:30 PM  Result Value Ref Range Status   Specimen Description BLOOD RIGHT HAND  Final   Special Requests BOTTLES DRAWN AEROBIC ONLY 5CC  Final   Culture NO GROWTH < 24 HOURS  Final   Report Status PENDING  Incomplete     Studies: Dg Foot Complete Left  03/01/2016  CLINICAL DATA:  LEFT foot redness and swelling which has progressively gotten worse over last 2 weeks, pain, history type II diabetes mellitus, hypertension EXAM: LEFT FOOT - COMPLETE 3+ VIEW COMPARISON:  10/21/2015 FINDINGS: Osseous demineralization. Diffuse soft tissue swelling LEFT foot greatest at great toe. Small juxta-articular calcifications at the first MTP joint appear unchanged question degenerative. Soft tissue calcifications at the medial aspect of the LEFT midfoot, uncertain etiology, unchanged. No acute fracture, dislocation, or bone destruction. No definite radiopaque foreign body or soft tissue gas. IMPRESSION: Significant soft tissue swelling without acute bony abnormalities. Electronically Signed   By: Lavonia Dana M.D.   On: 03/01/2016 17:29    Scheduled Meds: . aspirin EC  81 mg Oral Daily  . enoxaparin (LOVENOX) injection  40 mg Subcutaneous Q24H  . insulin aspart  0-9 Units Subcutaneous TID WC  . pantoprazole  40 mg Oral Daily  . piperacillin-tazobactam (ZOSYN)  IV  3.375 g  Intravenous Q8H  . vancomycin  750 mg Intravenous Q24H   Continuous Infusions: . sodium chloride 0.45 % 1,000 mL infusion 75 mL/hr at 03/02/16 1033       Time spent: 25 min    [facility] Pager [phone]. If 7PM-7AM, please contact night-coverage at [email or web address], Office  [phone]  password [email or web address] 03/03/2016, 1:52 PM  LOS: 2 days

## 2016-03-03 NOTE — Consult Note (Addendum)
ORTHOPAEDIC CONSULTATION  REQUESTING PHYSICIAN: Oswald Hillock, MD  PCP:  Jani Gravel, MD  Chief Complaint: evaluate left foot  HPI: [name] is a 82 y.o. female with a history of diabetes mellitus type 2, gout, and hypertension who was admitted to the hospital about 2 days ago for left foot cellulitis.The patient states that she stepped on a toothpick about 2 weeks ago. She says that she removed the toothpick in its entirety.She has been having increasing pain and erythema since that time. She was admitted to the hospitalist service 2 days ago and started on IV antibiotics. She does have an elevated white blood cell count. She has been afebrile in the hospital. Per the hospitalist, her cellulitis has improved somewhat over the past couple of days. Orthopedic consultation was obtained for management of her large blister.  Past Medical History  Diagnosis Date  . Osteoarthritis   . Degenerative joint disease   . Lumbar spinal stenosis   . Diabetes mellitus, type 2 ([place])   . Hypertension   . Gout   . GERD (gastroesophageal reflux disease)   . Fundic gland polyps of stomach, benign   . Helicobacter pylori gastritis   . Shingles     current shingles- x4 yrs., on L side of face     . Cancer ([facility])     melanoma- R arm , area excised   . Tubular adenoma of colon 07/2011  . Hiatal hernia    Past Surgical History  Procedure Laterality Date  . Total knee arthroplasty      left  . Lumbar laminectomy      6 prior back surgery   . Cholecystectomy  1998  . Vaginal hysterectomy  1990's  . Cataract extraction      bilateral, /w IOL  . Retinal laser procedure      bilateral  . Rotator cuff repair      bilateral  . Carpal tunnel release      bilateral  . Hemorrhoid surgery      x 2  . Tonsillectomy    . Benign br. biopsy- many yrs. ago    . Spinal fusion  08/22/2011    Procedure: FUSION POSTERIOR SPINAL MULTILEVEL/SCOLIOSIS;  Surgeon: Gunnar Bulla;  Location: [facility];  Service:  Orthopedics;  Laterality: N/A;  LUMBAR LAMINECTOMY, EXTENSION OF POSTERIOR LUMBAR FUSION TO T10 WITH K2M RODS, SCREWS, CONNECTORS, HOOKS, ILIAC CREST BONE GRAFT   Social History   Social History  . Marital Status: Divorced    Spouse Name: N/A  . Number of Children: N/A  . Years of Education: N/A   Social History Main Topics  . Smoking status: Never Smoker   . Smokeless tobacco: Never Used  . Alcohol Use: No  . Drug Use: No  . Sexual Activity: Not Asked   Other Topics Concern  . None   Social History Narrative   Family History  Problem Relation Age of Onset  . Colon cancer Neg Hx   . Anesthesia problems Neg Hx   . Hypotension Neg Hx   . Malignant hyperthermia Neg Hx   . Pseudochol deficiency Neg Hx   . Diabetes Mother   . Cancer Mother     mother  . Diabetes Paternal Aunt     x 1  . Diabetes Paternal Uncle     x 3  . Diabetes Brother   . Diabetes Other     neice  . Heart disease Father   . Cancer Father  gallbladder  . Liver cancer Father   . Heart disease Brother   . Colon polyps Sister    Allergies  Allergen Reactions  . Celebrex [Celecoxib] Swelling  . Morphine Sulfate Nausea Only  . Percocet [Oxycodone-Acetaminophen] Itching   Prior to Admission medications   Medication Sig Start Date End Date Taking? Authorizing Provider  amLODipine (NORVASC) 5 MG tablet Take 5 mg by mouth daily.     Yes Historical Provider, MD  aspirin EC 81 MG tablet Take 81 mg by mouth daily.   Yes Historical Provider, MD  baclofen (LIORESAL) 10 MG tablet  01/26/16  Yes Historical Provider, MD  calcium carbonate (OS-CAL) 600 MG TABS tablet Take 600 mg by mouth daily with breakfast.   Yes Historical Provider, MD  gabapentin (NEURONTIN) 100 MG capsule  01/26/16  Yes Historical Provider, MD  glimepiride (AMARYL) 4 MG tablet Take 4 mg by mouth 2 (two) times daily.   Yes Historical Provider, MD  HYDROcodone-acetaminophen (NORCO/VICODIN) 5-325 MG per tablet Take 1 tablet by mouth every 6  (six) hours as needed for pain.   Yes Historical Provider, MD  metFORMIN (GLUCOPHAGE) 500 MG tablet Take 500 mg by mouth 4 (four) times daily.    Yes Historical Provider, MD  omeprazole (PRILOSEC) 20 MG capsule Take 20 mg by mouth daily.   Yes Historical Provider, MD  Polyethyl Glycol-Propyl Glycol (SYSTANE OP) Apply 1 drop to eye daily as needed. For dry eyes   Yes Historical Provider, MD  ramipril (ALTACE) 10 MG capsule Take 10 mg by mouth daily.     Yes Historical Provider, MD  BAYER CONTOUR TEST test strip TEST ONCE D UTD 01/12/16   Historical Provider, MD   Dg Foot Complete Left  03/01/2016  CLINICAL DATA:  LEFT foot redness and swelling which has progressively gotten worse over last 2 weeks, pain, history type II diabetes mellitus, hypertension EXAM: LEFT FOOT - COMPLETE 3+ VIEW COMPARISON:  10/21/2015 FINDINGS: Osseous demineralization. Diffuse soft tissue swelling LEFT foot greatest at great toe. Small juxta-articular calcifications at the first MTP joint appear unchanged question degenerative. Soft tissue calcifications at the medial aspect of the LEFT midfoot, uncertain etiology, unchanged. No acute fracture, dislocation, or bone destruction. No definite radiopaque foreign body or soft tissue gas. IMPRESSION: Significant soft tissue swelling without acute bony abnormalities. Electronically Signed   By: Lavonia Dana M.D.   On: 03/01/2016 17:29    Positive ROS: All other systems have been reviewed and were otherwise negative with the exception of those mentioned in the HPI and as above.  Physical Exam: General: Alert, no acute distress Cardiovascular: No pedal edema Respiratory: No cyanosis, no use of accessory musculature GI: No organomegaly, abdomen is soft and non-tender Skin: No lesions in the area of chief complaint Neurologic: Sensation intact distally Psychiatric: Patient is competent for consent with normal mood and affect Lymphatic: No axillary or cervical  lymphadenopathy  MUSCULOSKELETAL: examination of the left lower extremity reveals that she does have cellulitis about the dorsum of the foot. She has a large blister essentially encircling the MTP joint of the great toe with approximately 8 mm of dorsal skin that is spared.She appears to have pus within the blister. She has a 1+ DP pulse. She has positive motor function dorsiflexion, plantarflexion, and great toe extension. She reports subjective sensory change stocking glove distribution.  Assessment: Diabetic left foot wound with cellulitis and probable abscess  Plan: I discussed the findings with the patient. It sounds like her cellulitis has  improved on IV antibiotics.  I recommend that we unroof her blister to have a closer look. After verbal consent was obtained,I prepped her left foot with Betadine.I used a sterile pickup and Iris scissor to remove the superficial layer of skin over her blister. She had purulent material within the blister that I sent for a wound culture. I was able to identify a puncture hole on the plantar aspect of the foot near the MTP joint.The base of her blister was erythematous. She did have a small amount of fluctuance palpable over the plantar aspect of the first ray. I told her that we need to obtain an MRI of the left foot with and without IV contrast to search for abscess and osteomyelitis. I discussed the findings with the hospitalist. NPO after midnight. Dr. Sharol Given to evaluate patient in the am.    Lyla Glassing Horald Pollen, MD Cell [phone]    03/03/2016 4:30 PM

## 2016-03-03 NOTE — Consult Note (Signed)
WOC wound consult note Reason for Consult: Right foot blistering. Patient reports possibly stepping on toothpick about 2 weeks ago. She has since developed blistering between the great toe and 2nd toe web space that extends on the dorsal foot about 3 cm and extends over the medial and plantar surface about 10cm.  The area is not open and I have probed between the toes for any openings. It is apparent there is a fluid collection under the bullous area.  The foot is erythematous and painful. She reports ambulation is painful  Wound type: unclear etiology, possible trauma to the foot in the presence of DM Pressure Ulcer POA: No Measurement:15cm x 6cm from plantar surface to dorsal surface between the great toe and 2nd toe.  Wound bed: not open, intact serous filled blister Drainage (amount, consistency, odor) none Periwound: erythema  Dressing procedure/placement/frequency: I will order topical care only with xeroform which will offer antibacterial protection should the areas open spontaneously.  If opened can certainly open new portal for infection, however with patients pain with ambulation the swelling and fluid in the blister may benefit the patient to have drained and then use antibacterial cover dressing to protect at that time.  Opening an intact blister is considered outside the scope of practice for the [facility] nurse, for that reason I have notified the Hospitalist that a surgical consult would be needed.  I have also explained this to the patient.  Discussed POC with patient and bedside nurse.  Re consult if needed, will not follow at this time. Thanks  Kamalani Mastro [place], [place] [phone])

## 2016-03-04 ENCOUNTER — Inpatient Hospital Stay (HOSPITAL_COMMUNITY): Payer: Medicare Other | Admitting: Anesthesiology

## 2016-03-04 ENCOUNTER — Encounter (HOSPITAL_COMMUNITY)
Admission: EM | Disposition: A | Discharge status: Skilled Nursing Facility | Payer: Self-pay | Source: Home / Self Care | Attending: Family Medicine

## 2016-03-04 DIAGNOSIS — L02612 Cutaneous abscess of left foot: Secondary | ICD-10-CM

## 2016-03-04 HISTORY — PX: AMPUTATION: SHX166

## 2016-03-04 LAB — CBC
HCT: 31.6 % — ABNORMAL LOW (ref 36.0–46.0)
Hemoglobin: 10.2 g/dL — ABNORMAL LOW (ref 12.0–15.0)
MCH: 28 pg (ref 26.0–34.0)
MCHC: 32.3 g/dL (ref 30.0–36.0)
MCV: 86.8 fL (ref 78.0–100.0)
Platelets: 332 10*3/uL (ref 150–400)
RBC: 3.64 MIL/uL — ABNORMAL LOW (ref 3.87–5.11)
RDW: 13.5 % (ref 11.5–15.5)
WBC: 16.1 10*3/uL — ABNORMAL HIGH (ref 4.0–10.5)

## 2016-03-04 LAB — GLUCOSE, CAPILLARY
GLUCOSE-CAPILLARY: 163 mg/dL — AB (ref 65–99)
GLUCOSE-CAPILLARY: 281 mg/dL — AB (ref 65–99)
Glucose-Capillary: 160 mg/dL — ABNORMAL HIGH (ref 65–99)
Glucose-Capillary: 174 mg/dL — ABNORMAL HIGH (ref 65–99)
Glucose-Capillary: 158 mg/dL — ABNORMAL HIGH (ref 65–99)

## 2016-03-04 LAB — MRSA PCR SCREENING: MRSA BY PCR: NEGATIVE

## 2016-03-04 LAB — SYNOVIAL FLUID, CRYSTAL: Crystals, Fluid: NONE SEEN

## 2016-03-04 LAB — URIC ACID: URIC ACID, SERUM: 3 mg/dL (ref 2.3–6.6)

## 2016-03-04 SURGERY — AMPUTATION, FOOT, RAY
Anesthesia: General | Laterality: Left

## 2016-03-04 MED ORDER — FENTANYL CITRATE (PF) 100 MCG/2ML IJ SOLN
INTRAMUSCULAR | Status: AC
  Administered 2016-03-04: 100 ug via INTRAVENOUS
  Filled 2016-03-04: qty 2

## 2016-03-04 MED ORDER — DEXTROSE 5 % IV SOLN
500.0000 mg | Freq: Four times a day (QID) | INTRAVENOUS | Status: DC | PRN
  Filled 2016-03-04: qty 5

## 2016-03-04 MED ORDER — LACTATED RINGERS IV SOLN
INTRAVENOUS | Status: DC | PRN
  Administered 2016-03-04: 16:00:00 via INTRAVENOUS

## 2016-03-04 MED ORDER — SUCCINYLCHOLINE CHLORIDE 200 MG/10ML IV SOSY
PREFILLED_SYRINGE | INTRAVENOUS | Status: AC
  Filled 2016-03-04: qty 20

## 2016-03-04 MED ORDER — METHOCARBAMOL 500 MG PO TABS
500.0000 mg | ORAL_TABLET | Freq: Four times a day (QID) | ORAL | Status: DC | PRN
  Administered 2016-03-04 – 2016-03-07 (×5): 500 mg via ORAL
  Filled 2016-03-04 (×5): qty 1

## 2016-03-04 MED ORDER — METOCLOPRAMIDE HCL 5 MG PO TABS: 5.0000 mg | ORAL_TABLET | Freq: Three times a day (TID) | ORAL | Status: DC | PRN

## 2016-03-04 MED ORDER — CEFAZOLIN SODIUM-DEXTROSE 2-4 GM/100ML-% IV SOLN
2.0000 g | INTRAVENOUS | Status: DC
  Filled 2016-03-04: qty 100

## 2016-03-04 MED ORDER — 0.9 % SODIUM CHLORIDE (POUR BTL) OPTIME
TOPICAL | Status: DC | PRN
  Administered 2016-03-04: 1000 mL

## 2016-03-04 MED ORDER — ACETAMINOPHEN 325 MG PO TABS: 650.0000 mg | ORAL_TABLET | Freq: Four times a day (QID) | ORAL | Status: DC | PRN

## 2016-03-04 MED ORDER — SODIUM CHLORIDE 0.9 % IV SOLN: INTRAVENOUS | Status: DC

## 2016-03-04 MED ORDER — PROPOFOL 10 MG/ML IV BOLUS
INTRAVENOUS | Status: DC | PRN
  Administered 2016-03-04: 10 mg via INTRAVENOUS
  Administered 2016-03-04 (×2): 20 mg via INTRAVENOUS

## 2016-03-04 MED ORDER — FENTANYL CITRATE (PF) 100 MCG/2ML IJ SOLN
100.0000 ug | Freq: Once | INTRAMUSCULAR | Status: AC
  Administered 2016-03-04: 100 ug via INTRAVENOUS
  Filled 2016-03-04: qty 2

## 2016-03-04 MED ORDER — ACETAMINOPHEN 650 MG RE SUPP: 650.0000 mg | Freq: Four times a day (QID) | RECTAL | Status: DC | PRN

## 2016-03-04 MED ORDER — LIDOCAINE-EPINEPHRINE (PF) 1.5 %-1:200000 IJ SOLN
INTRAMUSCULAR | Status: DC | PRN
  Administered 2016-03-04: 5 mL via PERINEURAL

## 2016-03-04 MED ORDER — FENTANYL CITRATE (PF) 250 MCG/5ML IJ SOLN
INTRAMUSCULAR | Status: AC
  Filled 2016-03-04: qty 5

## 2016-03-04 MED ORDER — HYDROCODONE-ACETAMINOPHEN 5-325 MG PO TABS
1.0000 | ORAL_TABLET | ORAL | Status: DC | PRN
  Administered 2016-03-05 (×2): 1 via ORAL
  Administered 2016-03-05 (×2): 2 via ORAL
  Administered 2016-03-06: 1 via ORAL
  Administered 2016-03-06 – 2016-03-07 (×3): 2 via ORAL
  Filled 2016-03-04: qty 1
  Filled 2016-03-04 (×4): qty 2
  Filled 2016-03-04 (×2): qty 1
  Filled 2016-03-04: qty 2

## 2016-03-04 MED ORDER — SODIUM CHLORIDE 0.45 % IV SOLN: INTRAVENOUS | Status: DC

## 2016-03-04 MED ORDER — ETOMIDATE 2 MG/ML IV SOLN
INTRAVENOUS | Status: AC
  Filled 2016-03-04: qty 10

## 2016-03-04 MED ORDER — BUPIVACAINE-EPINEPHRINE (PF) 0.5% -1:200000 IJ SOLN
INTRAMUSCULAR | Status: DC | PRN
  Administered 2016-03-04: 20 mL via PERINEURAL

## 2016-03-04 MED ORDER — ONDANSETRON HCL 4 MG/2ML IJ SOLN: 4.0000 mg | Freq: Four times a day (QID) | INTRAMUSCULAR | Status: DC | PRN

## 2016-03-04 MED ORDER — EPHEDRINE 5 MG/ML INJ
INTRAVENOUS | Status: AC
  Filled 2016-03-04: qty 20

## 2016-03-04 MED ORDER — ROCURONIUM BROMIDE 50 MG/5ML IV SOLN
INTRAVENOUS | Status: AC
  Filled 2016-03-04: qty 2

## 2016-03-04 MED ORDER — MIDAZOLAM HCL 5 MG/5ML IJ SOLN
INTRAMUSCULAR | Status: DC | PRN
  Administered 2016-03-04 (×2): 1 mg via INTRAVENOUS

## 2016-03-04 MED ORDER — MIDAZOLAM HCL 2 MG/2ML IJ SOLN
INTRAMUSCULAR | Status: AC
  Filled 2016-03-04: qty 2

## 2016-03-04 MED ORDER — PHENYLEPHRINE 40 MCG/ML (10ML) SYRINGE FOR IV PUSH (FOR BLOOD PRESSURE SUPPORT)
PREFILLED_SYRINGE | INTRAVENOUS | Status: AC
  Filled 2016-03-04: qty 20

## 2016-03-04 MED ORDER — METOCLOPRAMIDE HCL 5 MG/ML IJ SOLN
5.0000 mg | Freq: Three times a day (TID) | INTRAMUSCULAR | Status: DC | PRN
Start: 2016-03-04 — End: 2016-03-07

## 2016-03-04 MED ORDER — ONDANSETRON HCL 4 MG PO TABS: 4.0000 mg | ORAL_TABLET | Freq: Four times a day (QID) | ORAL | Status: DC | PRN

## 2016-03-04 MED ORDER — LIDOCAINE 2% (20 MG/ML) 5 ML SYRINGE
INTRAMUSCULAR | Status: AC
  Filled 2016-03-04: qty 5

## 2016-03-04 SURGICAL SUPPLY — 26 items
BLADE SAW SGTL MED 73X18.5 STR (BLADE) ×2 IMPLANT
COVER SURGICAL LIGHT HANDLE (MISCELLANEOUS) ×4 IMPLANT
DRAPE INCISE IOBAN 66X45 STRL (DRAPES) ×2 IMPLANT
DRAPE U-SHAPE 47X51 STRL (DRAPES) ×4 IMPLANT
DURAPREP 26ML APPLICATOR (WOUND CARE) ×3 IMPLANT
ELECT REM PT RETURN 9FT ADLT (ELECTROSURGICAL) ×3
ELECTRODE REM PT RTRN 9FT ADLT (ELECTROSURGICAL) ×1 IMPLANT
GLOVE BIOGEL PI IND STRL 9 (GLOVE) ×1 IMPLANT
GLOVE BIOGEL PI INDICATOR 9 (GLOVE) ×2
GLOVE SURG ORTHO 9.0 STRL STRW (GLOVE) ×3 IMPLANT
GOWN STRL REUS W/ TWL LRG LVL3 (GOWN DISPOSABLE) ×1 IMPLANT
GOWN STRL REUS W/ TWL XL LVL3 (GOWN DISPOSABLE) ×2 IMPLANT
GOWN STRL REUS W/TWL LRG LVL3 (GOWN DISPOSABLE) ×3
GOWN STRL REUS W/TWL XL LVL3 (GOWN DISPOSABLE) ×3
KIT BASIN OR (CUSTOM PROCEDURE TRAY) ×3 IMPLANT
KIT PREVENA INCISION MGT20CM45 (CANNISTER) ×2 IMPLANT
KIT ROOM TURNOVER OR (KITS) ×3 IMPLANT
NS IRRIG 1000ML POUR BTL (IV SOLUTION) ×3 IMPLANT
PACK ORTHO EXTREMITY (CUSTOM PROCEDURE TRAY) ×3 IMPLANT
PAD ARMBOARD 7.5X6 YLW CONV (MISCELLANEOUS) ×4 IMPLANT
SPONGE LAP 18X18 X RAY DECT (DISPOSABLE) ×3 IMPLANT
SUT ETHILON 2 0 PSLX (SUTURE) ×6 IMPLANT
SWAB COLLECTION DEVICE MRSA (MISCELLANEOUS) ×2 IMPLANT
SWAB CULTURE ESWAB REG 1ML (MISCELLANEOUS) ×2 IMPLANT
TOWEL OR 17X24 6PK STRL BLUE (TOWEL DISPOSABLE) ×3 IMPLANT
TOWEL OR 17X26 10 PK STRL BLUE (TOWEL DISPOSABLE) ×3 IMPLANT

## 2016-03-04 NOTE — Op Note (Signed)
03/01/2016 - 03/04/2016  4:40 PM  PATIENT:  [name]    PRE-OPERATIVE DIAGNOSIS:  cellulitis of foot  POST-OPERATIVE DIAGNOSIS:  Same  PROCEDURE:  FOOT FIRST RAY AMPUTATION Local tissue rearrangement for wound closure 3 x 8 cm. Application of Prevena wound VAC.  SURGEON:  Newt Minion, MD  PHYSICIAN ASSISTANT:None ANESTHESIA:   General  PREOPERATIVE INDICATIONS:  [name] is a  83 y.o. female with a diagnosis of cellulitis of foot who failed conservative measures and elected for surgical management.    The risks benefits and alternatives were discussed with the patient preoperatively including but not limited to the risks of infection, bleeding, nerve injury, cardiopulmonary complications, the need for revision surgery, among others, and the patient was willing to proceed.  OPERATIVE IMPLANTS: None  OPERATIVE FINDINGS: Tophaceous gout with large abscess  OPERATIVE PROCEDURE: Patient brought the operating room after undergoing a regional anesthetic. After adequate levels anesthesia obtained patient's left lower extremity was prepped using DuraPrep draped into a sterile field. A timeout was called. An elliptical incision was made along the medial column to encompass the ulcerative tissue and the first ray. The first ray and ulcerative tissue were resected in 1 block of tissue. Electrocautery was used for hemostasis. Further debridement of infected tissue that was involved with both tophaceous gout as well as infection was debrided. Local tissue rearrangement was used for wound closure 4 x 8 cm. The wound edges approximated well. A Prevena wound VAC was applied this had a good suction fit patient was taken to the PACU in stable condition.

## 2016-03-04 NOTE — Progress Notes (Signed)
Pt transported off unit to OR for procedure. P. Amo Arien Morine RN 

## 2016-03-04 NOTE — Anesthesia Postprocedure Evaluation (Signed)
Anesthesia Post Note  Patient: [name]  Procedure(s) Performed: Procedure(s) (LRB): FOOT FIRST RAY AMPUTATION (Left)  Patient location during evaluation: PACU Anesthesia Type: MAC and Regional Level of consciousness: awake Pain management: pain level controlled Vital Signs Assessment: post-procedure vital signs reviewed and stable Respiratory status: spontaneous breathing Cardiovascular status: stable Postop Assessment: no signs of nausea or vomiting Anesthetic complications: no    Last Vitals:  Filed Vitals:   03/04/16 1740 03/04/16 1757  BP: 135/63 151/98  Pulse: 95   Temp: 37.1 C 37 C  Resp: 21 20    Last Pain:  Filed Vitals:   03/04/16 1800  PainSc: Asleep                 Jourdyn Ferrin

## 2016-03-04 NOTE — Anesthesia Procedure Notes (Signed)
Anesthesia Regional Block:  Popliteal block  Pre-Anesthetic Checklist: ,, timeout performed, Correct Patient, Correct Site, Correct Laterality, Correct Procedure, Correct Position, site marked, Risks and benefits discussed,  Surgical consent,  Pre-op evaluation,  At surgeon's request and post-op pain management  Laterality: Lower and Left  Prep: chloraprep       Needles:  Injection technique: Single-shot  Needle Type: Echogenic Stimulator Needle          Additional Needles:  Procedures: ultrasound guided (picture in chart) Popliteal block Narrative:  Injection made incrementally with aspirations every 5 mL.  Performed by: Personally  Anesthesiologist: Chirstopher Iovino  Additional Notes: H+P and labs reviewed, risks and benefits discussed with patient, procedure tolerated well without complications

## 2016-03-04 NOTE — Consult Note (Signed)
ORTHOPAEDIC CONSULTATION  REQUESTING PHYSICIAN: Oswald Hillock, MD  Chief Complaint: Purulent draining abscess left great toe MTP joint  HPI: [name] is a 82 y.o. female who presents with diabetic insensate neuropathy with a acute history of cellulitis and draining from an ulcer left great toe  Past Medical History  Diagnosis Date  . Osteoarthritis   . Degenerative joint disease   . Lumbar spinal stenosis   . Diabetes mellitus, type 2 ([place])   . Hypertension   . Gout   . GERD (gastroesophageal reflux disease)   . Fundic gland polyps of stomach, benign   . Helicobacter pylori gastritis   . Shingles     current shingles- x4 yrs., on L side of face     . Cancer ([facility])     melanoma- R arm , area excised   . Tubular adenoma of colon 07/2011  . Hiatal hernia    Past Surgical History  Procedure Laterality Date  . Total knee arthroplasty      left  . Lumbar laminectomy      6 prior back surgery   . Cholecystectomy  1998  . Vaginal hysterectomy  1990's  . Cataract extraction      bilateral, /w IOL  . Retinal laser procedure      bilateral  . Rotator cuff repair      bilateral  . Carpal tunnel release      bilateral  . Hemorrhoid surgery      x 2  . Tonsillectomy    . Benign br. biopsy- many yrs. ago    . Spinal fusion  08/22/2011    Procedure: FUSION POSTERIOR SPINAL MULTILEVEL/SCOLIOSIS;  Surgeon: Gunnar Bulla;  Location: [facility];  Service: Orthopedics;  Laterality: N/A;  LUMBAR LAMINECTOMY, EXTENSION OF POSTERIOR LUMBAR FUSION TO T10 WITH K2M RODS, SCREWS, CONNECTORS, HOOKS, ILIAC CREST BONE GRAFT   Social History   Social History  . Marital Status: Divorced    Spouse Name: N/A  . Number of Children: N/A  . Years of Education: N/A   Social History Main Topics  . Smoking status: Never Smoker   . Smokeless tobacco: Never Used  . Alcohol Use: No  . Drug Use: No  . Sexual Activity: Not Asked   Other Topics Concern  . None   Social History Narrative     Family History  Problem Relation Age of Onset  . Colon cancer Neg Hx   . Anesthesia problems Neg Hx   . Hypotension Neg Hx   . Malignant hyperthermia Neg Hx   . Pseudochol deficiency Neg Hx   . Diabetes Mother   . Cancer Mother     mother  . Diabetes Paternal Aunt     x 1  . Diabetes Paternal Uncle     x 3  . Diabetes Brother   . Diabetes Other     neice  . Heart disease Father   . Cancer Father     gallbladder  . Liver cancer Father   . Heart disease Brother   . Colon polyps Sister    - negative except otherwise stated in the family history section Allergies  Allergen Reactions  . Celebrex [Celecoxib] Swelling  . Morphine Sulfate Nausea Only  . Percocet [Oxycodone-Acetaminophen] Itching   Prior to Admission medications   Medication Sig Start Date End Date Taking? Authorizing Provider  amLODipine (NORVASC) 5 MG tablet Take 5 mg by mouth daily.     Yes Historical Provider,  MD  aspirin EC 81 MG tablet Take 81 mg by mouth daily.   Yes Historical Provider, MD  baclofen (LIORESAL) 10 MG tablet  01/26/16  Yes Historical Provider, MD  calcium carbonate (OS-CAL) 600 MG TABS tablet Take 600 mg by mouth daily with breakfast.   Yes Historical Provider, MD  gabapentin (NEURONTIN) 100 MG capsule  01/26/16  Yes Historical Provider, MD  glimepiride (AMARYL) 4 MG tablet Take 4 mg by mouth 2 (two) times daily.   Yes Historical Provider, MD  HYDROcodone-acetaminophen (NORCO/VICODIN) 5-325 MG per tablet Take 1 tablet by mouth every 6 (six) hours as needed for pain.   Yes Historical Provider, MD  metFORMIN (GLUCOPHAGE) 500 MG tablet Take 500 mg by mouth 4 (four) times daily.    Yes Historical Provider, MD  omeprazole (PRILOSEC) 20 MG capsule Take 20 mg by mouth daily.   Yes Historical Provider, MD  Polyethyl Glycol-Propyl Glycol (SYSTANE OP) Apply 1 drop to eye daily as needed. For dry eyes   Yes Historical Provider, MD  ramipril (ALTACE) 10 MG capsule Take 10 mg by mouth daily.     Yes  Historical Provider, MD  BAYER CONTOUR TEST test strip TEST ONCE D UTD 01/12/16   Historical Provider, MD   No results found. - pertinent xrays, CT, MRI studies were reviewed and independently interpreted  Positive ROS: All other systems have been reviewed and were otherwise negative with the exception of those mentioned in the HPI and as above.  Physical Exam: General: Alert, no acute distress Cardiovascular: No pedal edema Respiratory: No cyanosis, no use of accessory musculature GI: No organomegaly, abdomen is soft and non-tender Skin: Cellulitis left great toe with purulent draining ulcer Neurologic: Patient does not have protective sensation. Psychiatric: Patient is competent for consent with normal mood and affect Lymphatic: No axillary or cervical lymphadenopathy  MUSCULOSKELETAL:  Patient has a strong dorsalis pedis pulse she has cellulitis around the great toe MTP joint. There is purulent drainage. Review of the MRI scan shows an abscess around the MTP joint.  Assessment: Assessment: Diabetic insensate neuropathy with good pulse with purulent abscess left great toe MTP joint.  Plan: Plan: We'll plan for a first ray amputation. Discussed with the patient this may require more removal of tissue and bone and the first ray depending on findings intraoperatively. Risks were discussed including persistent infection nonhealing the wound need for higher level amputation. Patient states she understands wish to proceed at this time. We will plan for surgery this afternoon.  Thank you for the consult and the opportunity to see [name], [place] [phone] 7:44 AM

## 2016-03-04 NOTE — Progress Notes (Signed)
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 Uric acid level was ordered. Most likely patient will need gout medication either colchicine plus allopurinol or Uloric. Would continue IV antibiotics for 3 days and then discharged on 2 weeks of oral antibiotics.

## 2016-03-04 NOTE — Transfer of Care (Signed)
Immediate Anesthesia Transfer of Care Note  Patient: [name]  Procedure(s) Performed: Procedure(s): FOOT FIRST RAY AMPUTATION (Left)  Patient Location: PACU  Anesthesia Type:MAC combined with regional for post-op pain  Level of Consciousness: awake, alert  and oriented  Airway & Oxygen Therapy: Patient Spontanous Breathing  Post-op Assessment: Report given to RN and Post -op Vital signs reviewed and stable  Post vital signs: Reviewed and stable  Last Vitals:  Filed Vitals:   03/04/16 1543 03/04/16 1544  BP:    Pulse: 99 97  Temp:    Resp: 16 17    Last Pain:  Filed Vitals:   03/04/16 1544  PainSc: Asleep      Patients Stated Pain Goal: 0 ([id number])  Complications: No apparent anesthesia complications

## 2016-03-04 NOTE — Progress Notes (Signed)
Orthopedic Tech Progress Note Patient Details:  [name] [birth year] [id number] Left post-op shoe with pt.'s nurse for use in AM. Patient ID: [name], female   DOB: 06/18/1928, 82 y.o.   MRN: [id number]   [name] 03/04/2016, 10:49 PM
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
## 2016-03-04 NOTE — Anesthesia Preprocedure Evaluation (Addendum)
Anesthesia Evaluation  Patient identified by MRN, date of birth, ID band Patient awake    Reviewed: Allergy & Precautions, NPO status , Patient's Chart, lab work & pertinent test results  History of Anesthesia Complications Negative for: history of anesthetic complications  Airway Mallampati: II  TM Distance: >3 FB Neck ROM: Full    Dental  (+) Teeth Intact, Dental Advisory Given   Pulmonary neg pulmonary ROS,    breath sounds clear to auscultation       Cardiovascular hypertension, Pt. on medications (-) angina(-) Past MI and (-) CHF  Rhythm:Regular     Neuro/Psych neg Seizures  Neuromuscular disease negative psych ROS   GI/Hepatic Neg liver ROS, hiatal hernia, GERD  Medicated and Controlled,  Endo/Other  diabetes, Type 2, Oral Hypoglycemic Agents  Renal/GU negative Renal ROS     Musculoskeletal  (+) Arthritis ,   Abdominal   Peds  Hematology  (+) anemia ,   Anesthesia Other Findings   Reproductive/Obstetrics                            Anesthesia Physical Anesthesia Plan  ASA: III  Anesthesia Plan: MAC and Regional   Post-op Pain Management:    Induction: Intravenous  Airway Management Planned: Natural Airway, Nasal Cannula and Simple Face Mask  Additional Equipment: None  Intra-op Plan:   Post-operative Plan:   Informed Consent: I have reviewed the patients History and Physical, chart, labs and discussed the procedure including the risks, benefits and alternatives for the proposed anesthesia with the patient or authorized representative who has indicated his/her understanding and acceptance.   Dental advisory given  Plan Discussed with: CRNA, Surgeon and Anesthesiologist  Anesthesia Plan Comments:        Anesthesia Quick Evaluation

## 2016-03-05 DIAGNOSIS — L02612 Cutaneous abscess of left foot: Secondary | ICD-10-CM

## 2016-03-05 LAB — BASIC METABOLIC PANEL
ANION GAP: 11 (ref 5–15)
BUN: 11 mg/dL (ref 6–20)
CHLORIDE: 101 mmol/L (ref 101–111)
CO2: 24 mmol/L (ref 22–32)
Calcium: 8.5 mg/dL — ABNORMAL LOW (ref 8.9–10.3)
Creatinine, Ser: 0.81 mg/dL (ref 0.44–1.00)
GFR calc non Af Amer: >60 mL/min (ref >60)
Glucose, Bld: 235 mg/dL — ABNORMAL HIGH (ref 65–99)
POTASSIUM: 3.6 mmol/L (ref 3.5–5.1)
SODIUM: 136 mmol/L (ref 135–145)

## 2016-03-05 LAB — CBC
HEMATOCRIT: 31.1 % — AB (ref 36.0–46.0)
HEMOGLOBIN: 10 g/dL — AB (ref 12.0–15.0)
MCH: 28 pg (ref 26.0–34.0)
MCHC: 32.2 g/dL (ref 30.0–36.0)
MCV: 87.1 fL (ref 78.0–100.0)
Platelets: 315 10*3/uL (ref 150–400)
RBC: 3.57 MIL/uL — AB (ref 3.87–5.11)
RDW: 13.6 % (ref 11.5–15.5)
WBC: 14.8 10*3/uL — AB (ref 4.0–10.5)

## 2016-03-05 LAB — GLUCOSE, CAPILLARY
GLUCOSE-CAPILLARY: 212 mg/dL — AB (ref 65–99)
GLUCOSE-CAPILLARY: 217 mg/dL — AB (ref 65–99)
GLUCOSE-CAPILLARY: 203 mg/dL — AB (ref 65–99)
GLUCOSE-CAPILLARY: 167 mg/dL — AB (ref 65–99)
Glucose-Capillary: 179 mg/dL — ABNORMAL HIGH (ref 65–99)

## 2016-03-05 MED ORDER — ALLOPURINOL 100 MG PO TABS
100.0000 mg | ORAL_TABLET | Freq: Every day | ORAL | Status: DC
  Administered 2016-03-05 – 2016-03-07 (×3): 100 mg via ORAL
  Filled 2016-03-05 (×3): qty 1

## 2016-03-05 MED ORDER — COLCHICINE 0.6 MG PO TABS
0.6000 mg | ORAL_TABLET | Freq: Two times a day (BID) | ORAL | Status: DC
Start: 2016-03-05 — End: 2016-03-07
  Administered 2016-03-05 – 2016-03-07 (×5): 0.6 mg via ORAL
  Filled 2016-03-05 (×5): qty 1
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
## 2016-03-05 NOTE — Progress Notes (Signed)
Patient ID: [name], female   DOB: 01/28/1928, 82 y.o.   MRN: [id number] Post operative day 1 first ray amputation. Small amount of drainage in the wound VAC. Patient did have increased pain last night. This may be due to her gout. Her uric acid was 3.0 postoperatively most likely due to precipitation of the uric acid. Patient may benefit from a long-term treatment for her gout medically. Plan for physical therapy progressive ambulation nonweightbearing on the left. Orders written for evaluation for skilled nursing placement.

## 2016-03-05 NOTE — Progress Notes (Signed)
Pharmacy Antibiotic Note EDEN SARAVIA is a 82 y.o. 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   S/p first ray amputation and drainage of abscess. Per post op notes from 5/20 plan to continue abx for 3 more days and then convert to oral antibiotics for two more weeks.   Plan: 1. Continue Zosyn and vancomycin at present doses; will add stop dates IV abx 2. Following along with you daily   Temp (24hrs), Avg:99 F (37.2 C), Min:98.2 F (36.8 C), Max:100.1 F (37.8 C)   Recent Labs Lab 03/01/16 1634 03/01/16 2234 03/02/16 0727 03/03/16 0627 03/04/16 0604 03/05/16 0415  WBC 16.9* 16.9* 15.5* 15.1* 16.1* 14.8*  CREATININE 1.72* 1.33* 1.09* 0.75  --  0.81    CrCl cannot be calculated (Unknown ideal weight.).    Allergies  Allergen Reactions  . Celebrex [Celecoxib] Swelling  . Morphine Sulfate Nausea Only  . Percocet [Oxycodone-Acetaminophen] Itching   Antimicrobials this admission: 5/17 Zosyn >>  5/17 Vancomycin  >>   Microbiology results: 5/17 BCx: ngtd 5/19 wound cx: ngtd 5/20 Op cx: px  Thank you for allowing pharmacy to be a part of this patient's care.  Vincenza Hews, PharmD, BCPS 03/05/2016, 12:58 PM Pager: [phone]

## 2016-03-05 NOTE — Evaluation (Signed)
Physical Therapy Evaluation Patient Details Name: [name] MRN: [id number] DOB: 1928-07-02 Today's Date: 03/05/2016   History of Present Illness  [name] is a 82 y.o. female, With history of osteoarthritis, diabetes mellitus, hypertension, gout, GERD who came to the hospital with redness pain and swelling of left lower extremity. Now s/p L first ray amputation and placement of VAC  Clinical Impression   Patient is s/p above surgery resulting in functional limitations due to the deficits listed below (see PT Problem List).  Patient will benefit from skilled PT to increase their independence and safety with mobility to allow discharge to the venue listed below.       Follow Up Recommendations SNF    Equipment Recommendations  Wheelchair (measurements PT);Wheelchair cushion (measurements PT)    Recommendations for Other Services Other (comment) (If pt insists on going home, will need OT for ADLs)     Precautions / Restrictions Precautions Precautions: Fall Precaution Comments: VAC Restrictions LLE Weight Bearing: Non weight bearing      Mobility  Bed Mobility Overal bed mobility: Needs Assistance Bed Mobility: Supine to Sit     Supine to sit: Min guard     General bed mobility comments: Cues for technqiue and to scoot all the way to the EOB; very slow moving; L foot pain incr while in dependent postition  Transfers Overall transfer level: Needs assistance Equipment used: Rolling walker (2 wheeled) Transfers: Sit to/from Stand Sit to Stand: Min assist         General transfer comment: Cues for hand placement and safety  Ambulation/Gait Ambulation/Gait assistance: Mod assist Ambulation Distance (Feet):  (Heel-toe pivot steps on L foot bed to [facility] to recliner) Assistive device: Rolling walker (2 wheeled) Gait Pattern/deviations:  (heel-toe pivot and a few hops)     General Gait Details: Cues to support self on RW; verbal and demo cues for heel-to  pivot on R foot; noted occasional touchdown of L foot  Stairs            Wheelchair Mobility    Modified Rankin (Stroke Patients Only)       Balance Overall balance assessment: Needs assistance   Sitting balance-Leahy Scale: Good     Standing balance support: Bilateral upper extremity supported Standing balance-Leahy Scale: Poor                               Pertinent Vitals/Pain Pain Assessment: 0-10 Pain Score: 10-Worst pain ever Pain Location: L foot when in dependent position Pain Descriptors / Indicators: Aching Pain Intervention(s): Limited activity within patient's tolerance;Monitored during session;Repositioned    Home Living Family/patient expects to be discharged to:: Private residence Living Arrangements: Alone Available Help at Discharge: Family;Friend(s);Available PRN/intermittently (indicated she does not have 24 hour assist) Type of Home: House Home Access: Stairs to enter Entrance Stairs-Rails: Right Entrance Stairs-Number of Steps: 6 Home Layout: One level Home Equipment: Walker - 2 wheels;Bedside commode;Cane - single point      Prior Function Level of Independence: Independent         Comments: Has been using cane more since foot has been sore     Hand Dominance        Extremity/Trunk Assessment   Upper Extremity Assessment: Overall WFL for tasks assessed           Lower Extremity Assessment: Generalized weakness;LLE deficits/detail   LLE Deficits / Details: decr ankle ROM; able to lift LLE up  for straight leg raise against gravity; VAC applied to L foot     Communication   Communication: No difficulties  Cognition Arousal/Alertness: Awake/alert Behavior During Therapy: WFL for tasks assessed/performed Overall Cognitive Status: Within Functional Limits for tasks assessed                      General Comments      Exercises        Assessment/Plan    PT Assessment Patient needs continued PT  services  PT Diagnosis Difficulty walking;Generalized weakness;Acute pain   PT Problem List Decreased strength;Decreased range of motion;Decreased activity tolerance;Decreased balance;Decreased mobility;Decreased coordination;Decreased knowledge of use of DME;Decreased safety awareness;Decreased knowledge of precautions;Pain  PT Treatment Interventions DME instruction;Gait training;Stair training;Functional mobility training;Therapeutic activities;Therapeutic exercise;Balance training;Patient/family education;Wheelchair mobility training   PT Goals (Current goals can be found in the Care Plan section) Acute Rehab PT Goals Patient Stated Goal: Very much wants to get home PT Goal Formulation: With patient Time For Goal Achievement: 03/19/16 Potential to Achieve Goals: Fair    Frequency Min 3X/week   Barriers to discharge Decreased caregiver support      Co-evaluation               End of Session Equipment Utilized During Treatment: Gait belt Activity Tolerance: Patient tolerated treatment well Patient left: in chair;with call bell/phone within reach;with family/visitor present Nurse Communication: Mobility status         Time: [id number] PT Time Calculation (min) (ACUTE ONLY): 34 min   Charges:   PT Evaluation $PT Eval Moderate Complexity: 1 Procedure PT Treatments $Therapeutic Activity: 8-22 mins   PT G Codes:        Quin Hoop 03/05/2016, 5:35 PM  [place], [place]  Acute Rehabilitation Services Pager [phone] Office [phone]

## 2016-03-06 LAB — GLUCOSE, CAPILLARY
GLUCOSE-CAPILLARY: 160 mg/dL — AB (ref 65–99)
GLUCOSE-CAPILLARY: 123 mg/dL — AB (ref 65–99)
GLUCOSE-CAPILLARY: 168 mg/dL — AB (ref 65–99)

## 2016-03-06 LAB — CULTURE, BLOOD (ROUTINE X 2)
CULTURE: NO GROWTH
Culture: NO GROWTH

## 2016-03-06 LAB — VANCOMYCIN, TROUGH: Vancomycin Tr: <4 ug/mL — ABNORMAL LOW (ref 10.0–20.0)

## 2016-03-06 LAB — WOUND CULTURE

## 2016-03-06 MED ORDER — POLYETHYLENE GLYCOL 3350 17 G PO PACK
17.0000 g | PACK | Freq: Every day | ORAL | Status: DC
  Administered 2016-03-06 – 2016-03-07 (×2): 17 g via ORAL
  Filled 2016-03-06: qty 1

## 2016-03-06 MED ORDER — VANCOMYCIN HCL IN DEXTROSE 750-5 MG/150ML-% IV SOLN
750.0000 mg | Freq: Two times a day (BID) | INTRAVENOUS | Status: DC
  Administered 2016-03-06 – 2016-03-07 (×2): 750 mg via INTRAVENOUS
  Filled 2016-03-06 (×3): qty 150
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
## 2016-03-06 NOTE — NC FL2 (Signed)
[place] LEVEL OF CARE SCREENING TOOL     IDENTIFICATION  Patient Name: [name] Birthdate: May 07, 1928 Sex: female Admission Date (Current Location): 03/01/2016  [facility] [place] Number:  Herbalist and Address:  The . [facility], [place] [address], [place], [place] [address]      Provider Number: [id number]  Attending Physician Name and Address:  Oswald Hillock, MD  Relative Name and Phone Number:       Current Level of Care: Hospital Recommended Level of Care: [place] Prior Approval Number:    Date Approved/Denied:   PASRR Number: [id number]  Discharge Plan: SNF    Current Diagnoses: Patient Active Problem List   Diagnosis Date Noted  . Foot abscess, left 03/04/2016  . Cellulitis of left lower extremity 03/01/2016  . Cellulitis 03/01/2016  . Altered mental status 08/20/2014  . Slurred speech 08/20/2014  . PE (pulmonary embolism) 08/25/2011  . Spinal stenosis of lumbar region at multiple levels 08/23/2011  . Diabetes mellitus ([place]) 08/23/2011  . Shingles   . Esophageal reflux 07/25/2011  . Unspecified constipation 07/25/2011    Orientation RESPIRATION BLADDER Height & Weight     Self, Time, Situation, Place  Normal Continent Weight:   Height:     BEHAVIORAL SYMPTOMS/MOOD NEUROLOGICAL BOWEL NUTRITION STATUS      Continent    AMBULATORY STATUS COMMUNICATION OF NEEDS Skin   Limited Assist Verbally Surgical wounds                       Personal Care Assistance Level of Assistance  Dressing, Bathing Bathing Assistance: Limited assistance   Dressing Assistance: Limited assistance     Functional Limitations Info             SPECIAL CARE FACTORS FREQUENCY  PT (By licensed PT), OT (By licensed OT)     PT Frequency: daily OT Frequency: daily            Contractures Contractures Info: Not present    Additional Factors Info  Allergies   Allergies Info: Celebrex, Morphine  Sulfate, Percocet           Current Medications (03/06/2016):  This is the current hospital active medication list Current Facility-Administered Medications  Medication Dose Route Frequency Provider Last Rate Last Dose  . 0.9 %  sodium chloride infusion   Intravenous Continuous [place] Score V, MD      . acetaminophen (TYLENOL) tablet 650 mg  650 mg Oral Q6H PRN Newt Minion, MD       Or  . acetaminophen (TYLENOL) suppository 650 mg  650 mg Rectal Q6H PRN Newt Minion, MD      . allopurinol (ZYLOPRIM) tablet 100 mg  100 mg Oral Daily Oswald Hillock, MD   100 mg at 03/06/16 [id number]  . aspirin EC tablet 81 mg  81 mg Oral Daily Oswald Hillock, MD   81 mg at 03/06/16 [id number]  . colchicine tablet 0.6 mg  0.6 mg Oral BID Oswald Hillock, MD   0.6 mg at 03/06/16 [id number]  . enoxaparin (LOVENOX) injection 40 mg  40 mg Subcutaneous Q24H Oswald Hillock, MD   40 mg at 03/05/16 2308  . HYDROcodone-acetaminophen (NORCO/VICODIN) 5-325 MG per tablet 1 tablet  1 tablet Oral Q6H PRN Oswald Hillock, MD   1 tablet at 03/05/16 [phone]  . HYDROcodone-acetaminophen (NORCO/VICODIN) 5-325 MG per tablet 1-2 tablet  1-2 tablet Oral Q4H  PRN Newt Minion, MD   1 tablet at 03/06/16 0840  . insulin aspart (novoLOG) injection 0-9 Units  0-9 Units Subcutaneous TID WC Oswald Hillock, MD   2 Units at 03/06/16 [phone]  . methocarbamol (ROBAXIN) tablet 500 mg  500 mg Oral Q6H PRN Newt Minion, MD   500 mg at 03/06/16 [id number]   Or  . methocarbamol (ROBAXIN) 500 mg in dextrose 5 % 50 mL IVPB  500 mg Intravenous Q6H PRN Newt Minion, MD      . metoCLOPramide (REGLAN) tablet 5-10 mg  5-10 mg Oral Q8H PRN Newt Minion, MD       Or  . metoCLOPramide (REGLAN) injection 5-10 mg  5-10 mg Intravenous Q8H PRN Newt Minion, MD      . ondansetron [facility]) tablet 4 mg  4 mg Oral Q6H PRN Newt Minion, MD       Or  . ondansetron [facility]) injection 4 mg  4 mg Intravenous Q6H PRN Newt Minion, MD      . pantoprazole (PROTONIX) EC tablet 40 mg  40 mg Oral Daily Oswald Hillock, MD   40 mg at 03/06/16 [id number]  . piperacillin-tazobactam (ZOSYN) IVPB 3.375 g  3.375 g Intravenous Q8H Oswald Hillock, MD   3.375 g at 03/06/16 [id number]  . polyethylene glycol (MIRALAX / GLYCOLAX) packet 17 g  17 g Oral Daily Oswald Hillock, MD   17 g at 03/06/16 0848  . sodium chloride 0.45 % 1,000 mL infusion   Intravenous Continuous Oswald Hillock, MD 75 mL/hr at 03/05/16 1503    . vancomycin (VANCOCIN) IVPB 750 mg/150 ml premix  750 mg Intravenous Q24H Wendee Beavers, RPH   750 mg at 03/05/16 1653     Discharge Medications: Please see discharge summary for a list of discharge medications.  Relevant Imaging Results:  Relevant Lab Results:   Additional Information SSN: [id number] - possible need for IV abx at time of dc- cellulitis  Greely Atiyeh C, LCSW

## 2016-03-06 NOTE — Progress Notes (Signed)
Pharmacy Antibiotic Note  [name] is a 82 y.o. 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  Pharmacy has been consulted for vancomycin and Zosyn dosing.  She is s/p first ray amputation and drainage fo abscess on 03/04/16.  Her renal function is stable and the vancomycin trough is sub-therapeutic.   Plan: - Change vanc to 750mg  IV Q12H (ends 03/07/16 per post-op note) - Continue Zosyn 3.375gm IV Q8H, 4 hr infusion - Monitor renal fxn, clinical progress - No plan to repeat vanc trough as therapy is scheduled to end tomorrow.  F/U with transitioning patient to PO antibiotic.     Temp (24hrs), Avg:98.2 F (36.8 C), Min:98.1 F (36.7 C), Max:98.2 F (36.8 C)   Recent Labs Lab 03/01/16 1634 03/01/16 2234 03/02/16 0727 03/03/16 0627 03/04/16 0604 03/05/16 0415 03/06/16 1636  WBC 16.9* 16.9* 15.5* 15.1* 16.1* 14.8*  --   CREATININE 1.72* 1.33* 1.09* 0.75  --  0.81  --   VANCOTROUGH  --   --   --   --   --   --  <4*    CrCl cannot be calculated (Unknown ideal weight.).    Allergies  Allergen Reactions  . Celebrex [Celecoxib] Swelling  . Morphine Sulfate Nausea Only  . Percocet [Oxycodone-Acetaminophen] Itching    Antimicrobials this admission: Vanc 5/17 > 5/23 Zosyn 5/17 > 5/23 (?5/24)  Dose adjustments this admission: 5/22 VT = <4 mcg/mL on 750mg  q24 (SCr 0.81) >> 750mg  q12  Microbiology results: 5/17 blood x 2 ngtd x4 days 5/19: L foot wound:  ngtd x1 day 5/20 OP cx: px 5/20 MRSA PCR negative    Ulisses Vondrak D. Mina Marble, PharmD, BCPS Pager:  [phone] 03/06/2016, 6:05 PM

## 2016-03-07 ENCOUNTER — Encounter (HOSPITAL_COMMUNITY): Payer: Self-pay | Admitting: Orthopedic Surgery

## 2016-03-07 LAB — GLUCOSE, CAPILLARY
GLUCOSE-CAPILLARY: 164 mg/dL — AB (ref 65–99)
GLUCOSE-CAPILLARY: 191 mg/dL — AB (ref 65–99)

## 2016-03-07 LAB — CULTURE, ROUTINE-ABSCESS: Gram Stain: NONE SEEN

## 2016-03-07 MED ORDER — HYDROCODONE-ACETAMINOPHEN 5-325 MG PO TABS: 1.0000 | ORAL_TABLET | Freq: Four times a day (QID) | ORAL | Status: DC | PRN

## 2016-03-07 MED ORDER — INSULIN ASPART 100 UNIT/ML ~~LOC~~ SOLN: 0.0000 [IU] | Freq: Three times a day (TID) | SUBCUTANEOUS | Status: DC

## 2016-03-07 MED ORDER — METHOCARBAMOL 500 MG PO TABS: 500.0000 mg | ORAL_TABLET | Freq: Four times a day (QID) | ORAL | Status: DC | PRN

## 2016-03-07 MED ORDER — AMOXICILLIN-POT CLAVULANATE 875-125 MG PO TABS: 1.0000 | ORAL_TABLET | Freq: Two times a day (BID) | ORAL | Status: AC

## 2016-03-07 MED ORDER — ALLOPURINOL 100 MG PO TABS: 100.0000 mg | ORAL_TABLET | Freq: Every day | ORAL | Status: DC

## 2016-03-07 MED ORDER — COLCHICINE 0.6 MG PO TABS: 0.6000 mg | ORAL_TABLET | Freq: Two times a day (BID) | ORAL | Status: DC | PRN

## 2016-03-07 NOTE — Clinical Social Work Note (Signed)
Patient will discharge today per MD order. Patient will discharge to: [facility] SNF RN to call report prior to transportation to: [phone] Transportation: [facility]- to be called at 1pm  CSW sent discharge summary to SNF for review.    Nonnie Done, LCSW [phone]  5N1-9, 2S 15-16 and Psychiatric Service Line  Licensed Clinical Social Worker
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
## 2016-03-07 NOTE — Clinical Social Work Note (Signed)
Clinical Social Work Assessment  Patient Details  Name: [name] MRN: [id number] Date of Birth: 1927-10-24  Date of referral:  03/07/16               Reason for consult:  Facility Placement                Permission sought to share information with:  Facility Sport and exercise psychologist, Family Supports Permission granted to share information::  Yes, Verbal Permission Granted  Name::      (Son)  Agency::   [facility] Staff SNF)  Relationship::     Contact Information:     Housing/Transportation Living arrangements for the past 2 months:  Single Family Home Source of Information:  Patient Patient Interpreter Needed:  None Criminal Activity/Legal Involvement Pertinent to Current Situation/Hospitalization:    Significant Relationships:  Adult Children, Siblings Lives with:  Self Do you feel safe going back to the place where you live?  Yes Need for family participation in patient care:  No (Coment)  Care giving concerns:  No caregiving concerns during this assessment.  Of note, son was present.   Social Worker assessment / plan:  CSW met with patient at bedside to discuss disposition.  Patient expressed frustration with the plan of going to SNF at time of discharge.  Patient reports having no assistance at home and is agreeable though apprehensive regarding SNF placement.  CSW assisted patient with questions regarding bed offers and SNF placement. CSW contacted [facility] while at bedside to inquire if the bed offer made was private room.  CSW confirmed patient will have private room at [facility].  Son was present at time of assessment and also agreeable with SNF placement.  Patient requesting [facility] transportation as son is not able to transport due to demands of son's job.  Employment status:  Retired Forensic scientist:  Medicare PT Recommendations:  [place] / Referral to community resources:  [place]  Patient/Family's Response to care:  Patient and son are agreeable to SNF at time of discharge.  Patient/Family's Understanding of and Emotional Response to Diagnosis, Current Treatment, and Prognosis:  Patient was apprehensive in regards to SNF placement.  Patient expressed frustration in regards to not being able to return home, but acknowledges the need for STR at SNF prior to returning home.  Son was present at time of discharge and is agreeable to SNF placement and offered support to patient.    Emotional Assessment Appearance:  Appears younger than stated age Attitude/Demeanor/Rapport:  Apprehensive Affect (typically observed):  Adaptable Orientation:  Oriented to Self, Oriented to Place, Oriented to  Time, Oriented to Situation Alcohol / Substance use:  Not Applicable Psych involvement (Current and /or in the community):  No (Comment)  Discharge Needs  Concerns to be addressed:  No discharge needs identified Readmission within the last 30 days:  No Current discharge risk:  None Barriers to Discharge:  No Barriers Identified   Dulcy Fanny, LCSW 03/07/2016, 11:17 AM

## 2016-03-07 NOTE — Care Management Important Message (Signed)
Important Message  Patient Details  Name: [name] MRN: [id number] Date of Birth: [birth year]   Medicare Important Message Given:  Yes    [name], Leroy Sea 03/07/2016, 1:40 PM

## 2016-03-07 NOTE — Clinical Social Work Placement (Signed)
   CLINICAL SOCIAL WORK PLACEMENT  NOTE  Date:  03/07/2016  Patient Details  Name: [name] MRN: [id number] Date of Birth: 1928/02/27  Clinical Social Work is seeking post-discharge placement for this patient at the [place] level of care (*CSW will initial, date and re-position this form in  chart as items are completed):  Yes   Patient/family provided with [facility] Work Department's list of facilities offering this level of care within the geographic area requested by the patient (or if unable, by the patient's family).  Yes   Patient/family informed of their freedom to choose among providers that offer the needed level of care, that participate in Medicare, Medicaid or managed care program needed by the patient, have an available bed and are willing to accept the patient.  Yes   Patient/family informed of [facility]'s ownership interest in [facility] and [facility], as well as of the fact that they are under no obligation to receive care at these facilities.  PASRR submitted to EDS on 03/07/16     PASRR number received on       Existing PASRR number confirmed on 03/07/16     FL2 transmitted to all facilities in geographic area requested by pt/family on 03/07/16     FL2 transmitted to all facilities within larger geographic area on       Patient informed that his/her managed care company has contracts with or will negotiate with certain facilities, including the following:        Yes   Patient/family informed of bed offers received.  Patient chooses bed at [facility]     Physician recommends and patient chooses bed at      Patient to be transferred to [facility] on 03/07/16.  Patient to be transferred to facility by PTAR     Patient family notified on 03/07/16 of transfer.  Name of family member notified:  son     PHYSICIAN Please prepare priority discharge summary, including medications      Additional Comment:    _______________________________________________ Dulcy Fanny, LCSW 03/07/2016, 11:29 AM

## 2016-03-07 NOTE — Progress Notes (Signed)
Pt discharge education and instructions completed. Report discharge to [facility] and report called off to Louisa a nurse at the facility. Pt IV removed and wound vac switched to [place] type. Pt picked up by PTAR to be transported off to disposition. Pt transported off unit via stretcher with sister to take her belongings with her to the facility. Delia Heady RN

## 2016-03-07 NOTE — Progress Notes (Signed)
   03/07/16 1221  PT Visit Information  Last PT Received On 03/07/16  Reason Eval/Treat Not Completed Other (comment) ([place] at 1[place].  Will defer tx today.)  History of Present Illness Lillyan Staebell is a 82 y.o. female, With history of osteoarthritis, diabetes mellitus, hypertension, gout, GERD who came to the hospital with redness pain and swelling of left lower extremity. Now s/p L first ray amputation and placement of VAC  Governor Rooks, PTA pager [phone]

## 2016-03-09 LAB — ANAEROBIC CULTURE: Gram Stain: NONE SEEN

## 2016-04-02 ENCOUNTER — Encounter (HOSPITAL_COMMUNITY): Payer: Self-pay | Admitting: Emergency Medicine

## 2016-04-02 ENCOUNTER — Emergency Department (HOSPITAL_COMMUNITY): Payer: Medicare Other

## 2016-04-02 ENCOUNTER — Observation Stay (HOSPITAL_COMMUNITY): Payer: Medicare Other

## 2016-04-02 ENCOUNTER — Inpatient Hospital Stay (HOSPITAL_COMMUNITY)
Admission: EM | Admit: 2016-04-02 | Discharge: 2016-04-06 | DRG: 565 | Disposition: A | Discharge status: Home Health | Payer: Medicare Other | Attending: Internal Medicine | Admitting: Internal Medicine

## 2016-04-02 DIAGNOSIS — E1169 Type 2 diabetes mellitus with other specified complication: Secondary | ICD-10-CM | POA: Diagnosis not present

## 2016-04-02 DIAGNOSIS — I2699 Other pulmonary embolism without acute cor pulmonale: Secondary | ICD-10-CM

## 2016-04-02 DIAGNOSIS — E11628 Type 2 diabetes mellitus with other skin complications: Secondary | ICD-10-CM

## 2016-04-02 DIAGNOSIS — Z961 Presence of intraocular lens: Secondary | ICD-10-CM | POA: Diagnosis present

## 2016-04-02 DIAGNOSIS — M48061 Spinal stenosis, lumbar region without neurogenic claudication: Secondary | ICD-10-CM

## 2016-04-02 DIAGNOSIS — M1990 Unspecified osteoarthritis, unspecified site: Secondary | ICD-10-CM | POA: Diagnosis present

## 2016-04-02 DIAGNOSIS — Z9841 Cataract extraction status, right eye: Secondary | ICD-10-CM

## 2016-04-02 DIAGNOSIS — Z794 Long term (current) use of insulin: Secondary | ICD-10-CM

## 2016-04-02 DIAGNOSIS — T8744 Infection of amputation stump, left lower extremity: Secondary | ICD-10-CM | POA: Diagnosis not present

## 2016-04-02 DIAGNOSIS — R400 Somnolence: Secondary | ICD-10-CM

## 2016-04-02 DIAGNOSIS — E876 Hypokalemia: Secondary | ICD-10-CM | POA: Diagnosis present

## 2016-04-02 DIAGNOSIS — I10 Essential (primary) hypertension: Secondary | ICD-10-CM | POA: Diagnosis present

## 2016-04-02 DIAGNOSIS — B029 Zoster without complications: Secondary | ICD-10-CM

## 2016-04-02 DIAGNOSIS — T148 Other injury of unspecified body region: Secondary | ICD-10-CM

## 2016-04-02 DIAGNOSIS — L02612 Cutaneous abscess of left foot: Secondary | ICD-10-CM

## 2016-04-02 DIAGNOSIS — L089 Local infection of the skin and subcutaneous tissue, unspecified: Secondary | ICD-10-CM

## 2016-04-02 DIAGNOSIS — K219 Gastro-esophageal reflux disease without esophagitis: Secondary | ICD-10-CM | POA: Diagnosis not present

## 2016-04-02 DIAGNOSIS — Z9842 Cataract extraction status, left eye: Secondary | ICD-10-CM

## 2016-04-02 DIAGNOSIS — T148XXA Other injury of unspecified body region, initial encounter: Secondary | ICD-10-CM

## 2016-04-02 DIAGNOSIS — M4806 Spinal stenosis, lumbar region: Secondary | ICD-10-CM | POA: Diagnosis not present

## 2016-04-02 DIAGNOSIS — R4781 Slurred speech: Secondary | ICD-10-CM

## 2016-04-02 DIAGNOSIS — Z981 Arthrodesis status: Secondary | ICD-10-CM

## 2016-04-02 DIAGNOSIS — S91302A Unspecified open wound, left foot, initial encounter: Secondary | ICD-10-CM | POA: Diagnosis present

## 2016-04-02 DIAGNOSIS — E119 Type 2 diabetes mellitus without complications: Secondary | ICD-10-CM

## 2016-04-02 DIAGNOSIS — Z85820 Personal history of malignant melanoma of skin: Secondary | ICD-10-CM

## 2016-04-02 DIAGNOSIS — Z96652 Presence of left artificial knee joint: Secondary | ICD-10-CM | POA: Diagnosis present

## 2016-04-02 DIAGNOSIS — Z85828 Personal history of other malignant neoplasm of skin: Secondary | ICD-10-CM

## 2016-04-02 DIAGNOSIS — Z7982 Long term (current) use of aspirin: Secondary | ICD-10-CM

## 2016-04-02 DIAGNOSIS — E1151 Type 2 diabetes mellitus with diabetic peripheral angiopathy without gangrene: Secondary | ICD-10-CM | POA: Diagnosis present

## 2016-04-02 DIAGNOSIS — L03116 Cellulitis of left lower limb: Secondary | ICD-10-CM | POA: Insufficient documentation

## 2016-04-02 LAB — CBC WITH DIFFERENTIAL/PLATELET
BASOS ABS: 0.1 10*3/uL (ref 0.0–0.1)
Basophils Relative: 1 %
EOS PCT: 4 %
Eosinophils Absolute: 0.3 10*3/uL (ref 0.0–0.7)
HEMATOCRIT: 34.1 % — AB (ref 36.0–46.0)
Hemoglobin: 10.9 g/dL — ABNORMAL LOW (ref 12.0–15.0)
LYMPHS ABS: 2.4 10*3/uL (ref 0.7–4.0)
LYMPHS PCT: 29 %
MCH: 27.8 pg (ref 26.0–34.0)
MCHC: 32 g/dL (ref 30.0–36.0)
MCV: 87 fL (ref 78.0–100.0)
MONO ABS: 0.5 10*3/uL (ref 0.1–1.0)
MONOS PCT: 6 %
NEUTROS ABS: 5.1 10*3/uL (ref 1.7–7.7)
Neutrophils Relative %: 60 %
PLATELETS: 242 10*3/uL (ref 150–400)
RBC: 3.92 MIL/uL (ref 3.87–5.11)
RDW: 14.4 % (ref 11.5–15.5)
WBC: 8.3 10*3/uL (ref 4.0–10.5)

## 2016-04-02 LAB — APTT: aPTT: 26 seconds (ref 24–37)

## 2016-04-02 LAB — LACTIC ACID, PLASMA
LACTIC ACID, VENOUS: 1.7 mmol/L (ref 0.5–2.0)
LACTIC ACID, VENOUS: 2 mmol/L (ref 0.5–2.0)

## 2016-04-02 LAB — BASIC METABOLIC PANEL
ANION GAP: 8 (ref 5–15)
BUN: 10 mg/dL (ref 6–20)
CALCIUM: 9.2 mg/dL (ref 8.9–10.3)
CO2: 21 mmol/L — AB (ref 22–32)
Chloride: 107 mmol/L (ref 101–111)
Creatinine, Ser: 0.58 mg/dL (ref 0.44–1.00)
GFR calc Af Amer: >60 mL/min (ref >60)
GFR calc non Af Amer: >60 mL/min (ref >60)
GLUCOSE: 68 mg/dL (ref 65–99)
POTASSIUM: 3.4 mmol/L — AB (ref 3.5–5.1)
Sodium: 136 mmol/L (ref 135–145)

## 2016-04-02 LAB — C-REACTIVE PROTEIN

## 2016-04-02 LAB — PROTIME-INR
INR: 1.12 (ref 0.00–1.49)
Prothrombin Time: 14.6 seconds (ref 11.6–15.2)

## 2016-04-02 LAB — CBG MONITORING, ED: GLUCOSE-CAPILLARY: 73 mg/dL (ref 65–99)

## 2016-04-02 LAB — I-STAT CG4 LACTIC ACID, ED
Lactic Acid, Venous: 1.26 mmol/L (ref 0.5–2.0)
Lactic Acid, Venous: 1.09 mmol/L (ref 0.5–2.0)

## 2016-04-02 LAB — SEDIMENTATION RATE: Sed Rate: 15 mm/hr (ref 0–22)

## 2016-04-02 LAB — PROCALCITONIN: Procalcitonin: <0.1 ng/mL

## 2016-04-02 LAB — PREALBUMIN: Prealbumin: 17.9 mg/dL — ABNORMAL LOW (ref 18–38)

## 2016-04-02 MED ORDER — COLCHICINE 0.6 MG PO TABS: 0.6000 mg | ORAL_TABLET | Freq: Two times a day (BID) | ORAL | Status: DC | PRN

## 2016-04-02 MED ORDER — POTASSIUM CHLORIDE 20 MEQ/15ML (10%) PO SOLN
20.0000 meq | Freq: Once | ORAL | Status: AC
  Administered 2016-04-03: 20 meq via ORAL
  Filled 2016-04-02: qty 15

## 2016-04-02 MED ORDER — VANCOMYCIN HCL 500 MG IV SOLR
500.0000 mg | Freq: Two times a day (BID) | INTRAVENOUS | Status: DC
  Administered 2016-04-03 – 2016-04-04 (×4): 500 mg via INTRAVENOUS
  Filled 2016-04-02 (×8): qty 500

## 2016-04-02 MED ORDER — AMLODIPINE BESYLATE 5 MG PO TABS
5.0000 mg | ORAL_TABLET | Freq: Every day | ORAL | Status: DC
  Administered 2016-04-04 – 2016-04-06 (×3): 5 mg via ORAL
  Filled 2016-04-02 (×4): qty 1

## 2016-04-02 MED ORDER — SODIUM CHLORIDE 0.9 % IV SOLN: INTRAVENOUS | Status: DC

## 2016-04-02 MED ORDER — HYDROCODONE-ACETAMINOPHEN 5-325 MG PO TABS
1.0000 | ORAL_TABLET | Freq: Four times a day (QID) | ORAL | Status: DC | PRN
  Administered 2016-04-02 – 2016-04-06 (×7): 1 via ORAL
  Filled 2016-04-02 (×8): qty 1

## 2016-04-02 MED ORDER — ACETAMINOPHEN 650 MG RE SUPP: 650.0000 mg | Freq: Four times a day (QID) | RECTAL | Status: DC | PRN

## 2016-04-02 MED ORDER — POLYETHYL GLYCOL-PROPYL GLYCOL 0.4-0.3 % OP GEL: Freq: Every day | OPHTHALMIC | Status: DC | PRN

## 2016-04-02 MED ORDER — INSULIN ASPART 100 UNIT/ML ~~LOC~~ SOLN
0.0000 [IU] | Freq: Three times a day (TID) | SUBCUTANEOUS | Status: DC
  Administered 2016-04-03: 2 [IU] via SUBCUTANEOUS
  Administered 2016-04-03: 1 [IU] via SUBCUTANEOUS
  Administered 2016-04-03 – 2016-04-04 (×2): 2 [IU] via SUBCUTANEOUS
  Administered 2016-04-04: 3 [IU] via SUBCUTANEOUS
  Administered 2016-04-05: 1 [IU] via SUBCUTANEOUS
  Administered 2016-04-05: 3 [IU] via SUBCUTANEOUS
  Administered 2016-04-05: 1 [IU] via SUBCUTANEOUS
  Administered 2016-04-06: 2 [IU] via SUBCUTANEOUS

## 2016-04-02 MED ORDER — VANCOMYCIN HCL 500 MG IV SOLR: 500.0000 mg | Freq: Two times a day (BID) | INTRAVENOUS | Status: DC

## 2016-04-02 MED ORDER — ASPIRIN EC 81 MG PO TBEC
81.0000 mg | DELAYED_RELEASE_TABLET | Freq: Two times a day (BID) | ORAL | Status: DC
  Administered 2016-04-02 – 2016-04-06 (×8): 81 mg via ORAL
  Filled 2016-04-02 (×8): qty 1

## 2016-04-02 MED ORDER — INSULIN ASPART 100 UNIT/ML ~~LOC~~ SOLN: 0.0000 [IU] | Freq: Every day | SUBCUTANEOUS | Status: DC

## 2016-04-02 MED ORDER — METHOCARBAMOL 500 MG PO TABS
500.0000 mg | ORAL_TABLET | Freq: Four times a day (QID) | ORAL | Status: DC | PRN
  Administered 2016-04-02 – 2016-04-04 (×5): 500 mg via ORAL
  Filled 2016-04-02 (×6): qty 1

## 2016-04-02 MED ORDER — VANCOMYCIN HCL IN DEXTROSE 1-5 GM/200ML-% IV SOLN
1000.0000 mg | Freq: Once | INTRAVENOUS | Status: AC
  Administered 2016-04-02 (×2): 1000 mg via INTRAVENOUS
  Filled 2016-04-02: qty 200

## 2016-04-02 MED ORDER — PANTOPRAZOLE SODIUM 40 MG PO TBEC
40.0000 mg | DELAYED_RELEASE_TABLET | Freq: Every day | ORAL | Status: DC
Start: 2016-04-03 — End: 2016-04-06
  Administered 2016-04-03 – 2016-04-06 (×4): 40 mg via ORAL
  Filled 2016-04-02 (×4): qty 1

## 2016-04-02 MED ORDER — PIPERACILLIN-TAZOBACTAM 3.375 G IVPB
3.3750 g | Freq: Three times a day (TID) | INTRAVENOUS | Status: DC
  Administered 2016-04-03 – 2016-04-05 (×8): 3.375 g via INTRAVENOUS
  Filled 2016-04-02 (×10): qty 50

## 2016-04-02 MED ORDER — ENSURE ENLIVE PO LIQD
237.0000 mL | Freq: Two times a day (BID) | ORAL | Status: DC
  Administered 2016-04-03 – 2016-04-04 (×4): 237 mL via ORAL

## 2016-04-02 MED ORDER — PIPERACILLIN-TAZOBACTAM 3.375 G IVPB 30 MIN
3.3750 g | Freq: Once | INTRAVENOUS | Status: AC
  Administered 2016-04-02: 3.375 g via INTRAVENOUS
  Filled 2016-04-02: qty 50

## 2016-04-02 MED ORDER — SODIUM CHLORIDE 0.9 % IV SOLN
INTRAVENOUS | Status: DC
  Administered 2016-04-02: 21:00:00 via INTRAVENOUS

## 2016-04-02 MED ORDER — CALCIUM CARBONATE 1250 (500 CA) MG PO TABS
1250.0000 mg | ORAL_TABLET | Freq: Every day | ORAL | Status: DC
  Administered 2016-04-03 – 2016-04-06 (×4): 1250 mg via ORAL
  Filled 2016-04-02 (×5): qty 1

## 2016-04-02 MED ORDER — ACETAMINOPHEN 325 MG PO TABS: 650.0000 mg | ORAL_TABLET | Freq: Four times a day (QID) | ORAL | Status: DC | PRN

## 2016-04-02 MED ORDER — HEPARIN SODIUM (PORCINE) 5000 UNIT/ML IJ SOLN
5000.0000 [IU] | Freq: Three times a day (TID) | INTRAMUSCULAR | Status: DC
  Administered 2016-04-02 – 2016-04-06 (×11): 5000 [IU] via SUBCUTANEOUS
  Filled 2016-04-02 (×11): qty 1

## 2016-04-02 MED ORDER — DEXTROSE 50 % IV SOLN: 25.0000 mL | INTRAVENOUS | Status: DC | PRN

## 2016-04-02 MED ORDER — RAMIPRIL 10 MG PO CAPS
10.0000 mg | ORAL_CAPSULE | Freq: Every day | ORAL | Status: DC
  Administered 2016-04-04 – 2016-04-06 (×3): 10 mg via ORAL
  Filled 2016-04-02 (×4): qty 1

## 2016-04-02 MED ORDER — GADOBENATE DIMEGLUMINE 529 MG/ML IV SOLN
10.0000 mL | Freq: Once | INTRAVENOUS | Status: AC | PRN
  Administered 2016-04-02: 10 mL via INTRAVENOUS

## 2016-04-02 MED ORDER — ALLOPURINOL 100 MG PO TABS
100.0000 mg | ORAL_TABLET | Freq: Every day | ORAL | Status: DC
  Administered 2016-04-03 – 2016-04-06 (×4): 100 mg via ORAL
  Filled 2016-04-02 (×4): qty 1

## 2016-04-02 MED ORDER — ONDANSETRON HCL 4 MG/2ML IJ SOLN
4.0000 mg | Freq: Three times a day (TID) | INTRAMUSCULAR | Status: DC | PRN
  Administered 2016-04-02: 4 mg via INTRAVENOUS
  Filled 2016-04-02: qty 2

## 2016-04-02 NOTE — H&P (Signed)
History and Physical    [name] [id number]:[id number] DOB: [birth year] DOA: 04/02/2016  Referring MD/NP/PA:   PCP: Jani Gravel, MD   Patient coming from:  The patient is coming from home.  At baseline, pt is partially dependent for most of ADL.  Chief Complaint: left foot non-healing wound and infection  HPI: [name] is a 82 y.o. female with medical history significant of recently s/p of left great toe amputation due to abscess first MTP joint, diabetes mellitus, GERD, gout, chronic back pain, melanoma, who presents with left foot non-healing wound and infection.  Patient was recently hospitalized from 5/17-5/23 due to abscess of first MTP joint. She is s/p of left treat toe amptation. She completed 2-week of Augmentin. She just went home from rehabilitation facility 3 days ago. Patient has a home health nurse that comes to her house to help with dressing change. She was noticed to have more left foot swollen and redness. Pt states that there are some drainage coming out from the wound. The wound has not healed yet. She has mild pain in left foot. Patient does not have fever, chills. She has mild nausea, but no vomiting, abdominal pain, diarrhea. No chest pain, shortness of breath, cough, symptoms of UTI or unilateral weakness.  ED Course: pt was found to have WC 8.3, temperature 98.1, lactate 1.26 --.1.09, no tachycardia, no tachypnea, potassium 3.4, creatinine normal. X-ray of left food did not show osteomyelitis, but showed mild soft tissue swelling along the plantar aspect of the distal foot, no air in the soft tissues. Patient is placed on MedSurg bed for observation. Orthopedic surgeon will be consulted by EDP.   Review of Systems:   General: no fevers, chills, no changes in body weight, has fatigue HEENT: no blurry vision, hearing changes or sore throat Pulm: no dyspnea, coughing, wheezing CV: no chest pain, no palpitations Abd: has nausea, no vomiting, abdominal pain,  diarrhea, constipation GU: no dysuria, burning on urination, increased urinary frequency, hematuria  Ext: no leg edema Neuro: no unilateral weakness, numbness, or tingling, no vision change or hearing loss Skin: has wound over left foot. MSK: No muscle spasm, no deformity, no limitation of range of movement in spin Heme: No easy bruising.  Travel history: No recent long distant travel.  Allergy:  Allergies  Allergen Reactions  . Celebrex [Celecoxib] Swelling  . Morphine Sulfate Nausea Only  . Percocet [Oxycodone-Acetaminophen] Itching    Past Medical History  Diagnosis Date  . Osteoarthritis   . Degenerative joint disease   . Lumbar spinal stenosis   . Diabetes mellitus, type 2 ([place])   . Hypertension   . Gout   . GERD (gastroesophageal reflux disease)   . Fundic gland polyps of stomach, benign   . Helicobacter pylori gastritis   . Shingles     current shingles- x4 yrs., on L side of face     . Cancer (HCC)     melanoma- R arm , area excised   . Tubular adenoma of colon 07/2011  . Hiatal hernia     Past Surgical History  Procedure Laterality Date  . Total knee arthroplasty      left  . Lumbar laminectomy      6 prior back surgery   . Cholecystectomy  1998  . Vaginal hysterectomy  1990's  . Cataract extraction      bilateral, /w IOL  . Retinal laser procedure      bilateral  . Rotator cuff repair  bilateral  . Carpal tunnel release      bilateral  . Hemorrhoid surgery      x 2  . Tonsillectomy    . Benign br. biopsy- many yrs. ago    . Spinal fusion  08/22/2011    Procedure: FUSION POSTERIOR SPINAL MULTILEVEL/SCOLIOSIS;  Surgeon: Gunnar Bulla;  Location: [place];  Service: Orthopedics;  Laterality: N/A;  LUMBAR LAMINECTOMY, EXTENSION OF POSTERIOR LUMBAR FUSION TO T10 WITH K2M RODS, SCREWS, CONNECTORS, HOOKS, ILIAC CREST BONE GRAFT  . Amputation Left 03/04/2016    Procedure: FOOT FIRST RAY AMPUTATION;  Surgeon: Newt Minion, MD;  Location: [place];  Service:  Orthopedics;  Laterality: Left;    Social History:  reports that she has never smoked. She has never used smokeless tobacco. She reports that she does not drink alcohol or use illicit drugs.  Family History:  Family History  Problem Relation Age of Onset  . Colon cancer Neg Hx   . Anesthesia problems Neg Hx   . Hypotension Neg Hx   . Malignant hyperthermia Neg Hx   . Pseudochol deficiency Neg Hx   . Diabetes Mother   . Cancer Mother     mother  . Diabetes Paternal Aunt     x 1  . Diabetes Paternal Uncle     x 3  . Diabetes Brother   . Diabetes Other     neice  . Heart disease Father   . Cancer Father     gallbladder  . Liver cancer Father   . Heart disease Brother   . Colon polyps Sister      Prior to Admission medications   Medication Sig Start Date End Date Taking? Authorizing Provider  acarbose (PRECOSE) 25 MG tablet Take 25 mg by mouth daily. 03/31/16  Yes Historical Provider, MD  allopurinol (ZYLOPRIM) 100 MG tablet Take 1 tablet (100 mg total) by mouth daily. 03/07/16  Yes Oswald Hillock, MD  amLODipine (NORVASC) 5 MG tablet Take 5 mg by mouth daily.     Yes Historical Provider, MD  aspirin EC 81 MG tablet Take 81 mg by mouth 2 (two) times daily.    Yes Historical Provider, MD  calcium carbonate (OS-CAL) 600 MG TABS tablet Take 600 mg by mouth daily with breakfast.   Yes Historical Provider, MD  colchicine 0.6 MG tablet Take 1 tablet (0.6 mg total) by mouth 2 (two) times daily as needed (gout). 03/07/16  Yes Oswald Hillock, MD  glimepiride (AMARYL) 4 MG tablet Take 4 mg by mouth 2 (two) times daily.   Yes Historical Provider, MD  HYDROcodone-acetaminophen (NORCO/VICODIN) 5-325 MG tablet Take 1 tablet by mouth every 6 (six) hours as needed. 03/07/16  Yes Oswald Hillock, MD  metFORMIN (GLUCOPHAGE) 1000 MG tablet Take 1,000 mg by mouth 2 (two) times daily. 02/21/16  Yes Historical Provider, MD  methocarbamol (ROBAXIN) 500 MG tablet Take 1 tablet (500 mg total) by mouth every 6 (six)  hours as needed for muscle spasms. 03/07/16  Yes Oswald Hillock, MD  omeprazole (PRILOSEC) 20 MG capsule Take 20 mg by mouth daily.   Yes Historical Provider, MD  Polyethyl Glycol-Propyl Glycol (SYSTANE OP) Apply 1 drop to eye daily as needed. For dry eyes   Yes Historical Provider, MD  ramipril (ALTACE) 10 MG capsule Take 10 mg by mouth daily.     Yes Historical Provider, MD  insulin aspart (NOVOLOG) 100 UNIT/ML injection Inject 0-9 Units into the skin 3 (three) times  daily with meals. 03/07/16   Oswald Hillock, MD    Physical Exam: Filed Vitals:   04/02/16 1534 04/02/16 1700 04/02/16 1800  BP: 147/58 129/55 132/55  Pulse: 95 85 86  Temp: 98.1 F (36.7 C)    TempSrc: Oral    Resp: 18    SpO2: 95% 95% 96%   General: Not in acute distress HEENT:       Eyes: PERRL, EOMI, no scleral icterus.       ENT: No discharge from the ears and nose, no pharynx injection, no tonsillar enlargement.        Neck: No JVD, no bruit, no mass felt. Heme: No neck lymph node enlargement. Cardiac: S1/S2, RRR, No murmurs, No gallops or rubs. Pulm: No rales, wheezing, rhonchi or rubs. Abd: Soft, nondistended, nontender, no rebound pain, no organomegaly, BS present. GU: No hematuria Ext: No pitting leg edema bilaterally. 1+DP/PT pulse bilaterally. S/p of eft great toe ampuatation, very little purulent drainage can be expressed upon compression. There is surrounding redness, tenderness and warmth. Musculoskeletal: No joint deformities, No joint redness or warmth, no limitation of ROM in spin. Skin: No rashes.  Neuro: Alert, oriented X3, cranial nerves II-XII grossly intact, moves all extremities normally.  Psych: Patient is not psychotic, no suicidal or hemocidal ideation.  Labs on Admission: I have personally reviewed following labs and imaging studies  CBC:  Recent Labs Lab 04/02/16 1808  WBC 8.3  NEUTROABS 5.1  HGB 10.9*  HCT 34.1*  MCV 87.0  PLT 832   Basic Metabolic Panel:  Recent Labs Lab  04/02/16 1808  NA 136  K 3.4*  CL 107  CO2 21*  GLUCOSE 68  BUN 10  CREATININE 0.58  CALCIUM 9.2   GFR: CrCl cannot be calculated (Unknown ideal weight.). Liver Function Tests: No results for input(s): AST, ALT, ALKPHOS, BILITOT, PROT, ALBUMIN in the last 168 hours. No results for input(s): LIPASE, AMYLASE in the last 168 hours. No results for input(s): AMMONIA in the last 168 hours. Coagulation Profile: No results for input(s): INR, PROTIME in the last 168 hours. Cardiac Enzymes: No results for input(s): CKTOTAL, CKMB, CKMBINDEX, TROPONINI in the last 168 hours. BNP (last 3 results) No results for input(s): PROBNP in the last 8760 hours. HbA1C: No results for input(s): HGBA1C in the last 72 hours. CBG:  Recent Labs Lab 04/02/16 2004  GLUCAP 73   Lipid Profile: No results for input(s): CHOL, HDL, LDLCALC, TRIG, CHOLHDL, LDLDIRECT in the last 72 hours. Thyroid Function Tests: No results for input(s): TSH, T4TOTAL, FREET4, T3FREE, THYROIDAB in the last 72 hours. Anemia Panel: No results for input(s): VITAMINB12, FOLATE, FERRITIN, TIBC, IRON, RETICCTPCT in the last 72 hours. Urine analysis:    Component Value Date/Time   COLORURINE YELLOW 10/20/2009 0813   APPEARANCEUR CLEAR 10/20/2009 0813   LABSPEC 1.015 10/20/2009 0813   PHURINE 5.5 10/20/2009 0813   GLUCOSEU NEGATIVE 10/20/2009 0813   HGBUR NEGATIVE 10/20/2009 0813   BILIRUBINUR NEGATIVE 10/20/2009 0813   KETONESUR NEGATIVE 10/20/2009 0813   PROTEINUR NEGATIVE 10/20/2009 0813   UROBILINOGEN 0.2 10/20/2009 0813   NITRITE NEGATIVE 10/20/2009 0813   LEUKOCYTESUR  10/20/2009 0813    NEGATIVE MICROSCOPIC NOT DONE ON URINES WITH NEGATIVE PROTEIN, BLOOD, LEUKOCYTES, NITRITE, OR GLUCOSE <1000 mg/dL.   Sepsis Labs: @LABRCNTIP (procalcitonin:4,lacticidven:4) )No results found for this or any previous visit (from the past 240 hour(s)).   Radiological Exams on Admission: Dg Foot Complete Left  04/02/2016  CLINICAL  DATA:  Increased redness  left foot for a few days for wound check. Left great toe amputated 1 month ago. EXAM: LEFT FOOT - COMPLETE 3+ VIEW COMPARISON:  03/01/2016, 10/21/2015 FINDINGS: There is evidence of patient's first toe amputation distal to the medial cuneiform. Minimal degenerate change over the midfoot. No acute fracture or dislocation. No significant air within the soft tissues. Mild prominence of the plantar soft tissues of the distal foot. IMPRESSION: Evidence of recent first toe amputation distal to the medial cuneiform bone. Mild soft tissue swelling along the plantar aspect of the distal foot. No air in the soft tissues. Electronically Signed   By: Marin Olp M.D.   On: 04/02/2016 16:04     EKG: Not done in ED, will get one.   Assessment/Plan Principal Problem:   Diabetic foot infection ([place]) Active Problems:   Esophageal reflux   Spinal stenosis of lumbar region at multiple levels   Diabetes mellitus (HCC)   Hypokalemia   Wound infection ([place])   Diabetic foot infection and wound infection in left foot: pt seems to have wound infection given redness, swelling and warmth.  pt is not septic, hemodynamically stable.  - will place on tele bed - IV vancomycin and Zosyn were started in ED, will continue - PRN Zofran for nausea, Norco for pain - Blood cultures x 2  - ESR and CRP - wound care consult - MRI-left foot - will get Procalcitonin and trend lactic acid levels - IVF: 100 cc/h - INR/PTT/type - will get ABI - f/u  ortho recommendations  GERD: -Protonix  Spinal stenosis of lumbar region at multiple levels: -prn Norco  DM-II: Last A1c 8.4 on 03/01/13 , poorly  controled. Patient is taking  metformin, acarbose, Amaryl, NovoLog at home -SSI -Check A1c  Hypokalemia: K= 3.4 on admission. - Repleted - Check Mg level  DVT ppx: SQ Heparin (pt may need surgery, Heparin is easier to be stopped0 Code Status: Full code Family Communication: Yes, patient's sister at  bed side Disposition Plan:  Anticipate discharge back to previous home environment Consults called:  Ortho will consulted (Dr. Sharol Given is on call) Admission status: medical floor/obs  Date of Service 04/02/2016    Ivor Costa [facility] Pager [phone]  If 7PM-7AM, please contact night-coverage [email or web address] Password [email or web address] 04/02/2016, 8:25 PM

## 2016-04-02 NOTE — ED Notes (Signed)
Pt here for wound check for eval of increased redness to left foot where had great toe amputated

## 2016-04-02 NOTE — ED Provider Notes (Signed)
Medical screening examination/treatment/procedure(s) were conducted as a shared visit with non-physician practitioner(s) and myself.  I personally evaluated the patient during the encounter.   EKG Interpretation None      82 year old female who presents for wound evaluation. She has a history of diabetes, hypertension, and is status post left great toe amputation on 03/04/2016 by Dr. Sharol Given for cellulitis and abscess of the first MTP joint. She was discharged to rehabilitation Center on May 23, and just returned home 3 days ago. Patient's daughter is noted that 2 days ago she began to developed increased redness and swelling around her wound site, that have gradually increased. She was seen by a visiting nurse today, and it was recommended that she come to the ED for evaluation. Patient denies any fevers, chills, nausea or vomiting. Denies any new numbness or weakness. She is afebrile and hemodynamically stable. Normal lactate no leukocytosis. There is concern for surgical wound infection as wound site with surrounding erythema, swelling and warmth. Normal lactate, no leukocytosis. Will discuss with orthopedic surgery and plan for admission for IV antibiotics   Forde Dandy, MD 04/03/16 [phone]

## 2016-04-02 NOTE — Progress Notes (Signed)
Pharmacy Antibiotic Note  [name] is a 82 y.o. female admitted on 04/02/2016 with diabetic foot infection.  Pharmacy has been consulted for vancomycin and zosyn dosing.  Pt received vancomycin 1g and zosyn 3.375g IV once in the ED.  Plan: Vancomycin 500mg  IV every 12 hours.  Goal trough 15-20 mcg/mL. Zosyn 3.375g IV q8h (4 hour infusion).  Monitor culture data, renal function and clinical course VT at SS prn     Temp (24hrs), Avg:98.1 F (36.7 C), Min:98.1 F (36.7 C), Max:98.1 F (36.7 C)   Recent Labs Lab 04/02/16 1808 04/02/16 1815 04/02/16 2000  WBC 8.3  --   --   CREATININE 0.58  --   --   LATICACIDVEN  --  1.26 1.09    CrCl cannot be calculated (Unknown ideal weight.).    Allergies  Allergen Reactions  . Celebrex [Celecoxib] Swelling  . Morphine Sulfate Nausea Only  . Percocet [Oxycodone-Acetaminophen] Itching    Antimicrobials this admission: Vanc 6/18 >>  Zosyn 6/18 >>   Dose adjustments this admission: n/a  Microbiology results:  BCx:   UCx:    Sputum:    MRSA PCR:    Andrey Cota. Diona Foley, PharmD, [place] Clinical Pharmacist Pager [phone] 04/02/2016 8:27 PM

## 2016-04-02 NOTE — ED Provider Notes (Signed)
CSN: [id number]     Arrival date & time 04/02/16  1516 History   First MD Initiated Contact with Patient 04/02/16 1648     Chief Complaint  Patient presents with  . Wound Check     (Consider location/radiation/quality/duration/timing/severity/associated sxs/prior Treatment) HPI [name] is a 82 y.o. female with history of diabetes, hypertension, acid reflux, presents to emergency department complaining of swelling and drainage to the wound on the left foot. Patient had amputation of the left great toe on 03/04/16 forfirst MTP joint. She was initially treated with a wound VAC, and was discharged to a rehabilitation facility on 03/07/16. She eventually was discharged home just 3 days ago. Patient has a home health nurse that comes to her house to help with dressing change. Patient's daughter noticed that 2 days ago when she changed her dressing at home, her foot looked more swollen than before. Yesterday she states her foot was a little more swollen, and when the nurse looked at it today she recommended patient come into the emergency department. Patient denies fever or chills. She is having some pain to the foot but states she does not have a good sensation in her foot due to her diabetes. Patient denies any other complaints.  Past Medical History  Diagnosis Date  . Osteoarthritis   . Degenerative joint disease   . Lumbar spinal stenosis   . Diabetes mellitus, type 2 ([place])   . Hypertension   . Gout   . GERD (gastroesophageal reflux disease)   . Fundic gland polyps of stomach, benign   . Helicobacter pylori gastritis   . Shingles     current shingles- x4 yrs., on L side of face     . Cancer (HCC)     melanoma- R arm , area excised   . Tubular adenoma of colon 07/2011  . Hiatal hernia    Past Surgical History  Procedure Laterality Date  . Total knee arthroplasty      left  . Lumbar laminectomy      6 prior back surgery   . Cholecystectomy  1998  . Vaginal hysterectomy  1990's   . Cataract extraction      bilateral, /w IOL  . Retinal laser procedure      bilateral  . Rotator cuff repair      bilateral  . Carpal tunnel release      bilateral  . Hemorrhoid surgery      x 2  . Tonsillectomy    . Benign br. biopsy- many yrs. ago    . Spinal fusion  08/22/2011    Procedure: FUSION POSTERIOR SPINAL MULTILEVEL/SCOLIOSIS;  Surgeon: Gunnar Bulla;  Location: [place];  Service: Orthopedics;  Laterality: N/A;  LUMBAR LAMINECTOMY, EXTENSION OF POSTERIOR LUMBAR FUSION TO T10 WITH K2M RODS, SCREWS, CONNECTORS, HOOKS, ILIAC CREST BONE GRAFT  . Amputation Left 03/04/2016    Procedure: FOOT FIRST RAY AMPUTATION;  Surgeon: Newt Minion, MD;  Location: [place];  Service: Orthopedics;  Laterality: Left;   Family History  Problem Relation Age of Onset  . Colon cancer Neg Hx   . Anesthesia problems Neg Hx   . Hypotension Neg Hx   . Malignant hyperthermia Neg Hx   . Pseudochol deficiency Neg Hx   . Diabetes Mother   . Cancer Mother     mother  . Diabetes Paternal Aunt     x 1  . Diabetes Paternal Uncle     x 3  . Diabetes Brother   .  Diabetes Other     neice  . Heart disease Father   . Cancer Father     gallbladder  . Liver cancer Father   . Heart disease Brother   . Colon polyps Sister    Social History  Substance Use Topics  . Smoking status: Never Smoker   . Smokeless tobacco: Never Used  . Alcohol Use: No   OB History    No data available     Review of Systems  Constitutional: Negative for fever and chills.  Respiratory: Negative for cough, chest tightness and shortness of breath.   Cardiovascular: Negative for chest pain, palpitations and leg swelling.  Gastrointestinal: Negative for nausea, vomiting, abdominal pain and diarrhea.  Musculoskeletal: Positive for arthralgias. Negative for myalgias, neck pain and neck stiffness.  Skin: Positive for color change and wound. Negative for rash.  Neurological: Negative for dizziness, weakness and headaches.   All other systems reviewed and are negative.     Allergies  Celebrex; Morphine sulfate; and Percocet  Home Medications   Prior to Admission medications   Medication Sig Start Date End Date Taking? Authorizing Provider  allopurinol (ZYLOPRIM) 100 MG tablet Take 1 tablet (100 mg total) by mouth daily. 03/07/16   Oswald Hillock, MD  amLODipine (NORVASC) 5 MG tablet Take 5 mg by mouth daily.      Historical Provider, MD  aspirin EC 81 MG tablet Take 81 mg by mouth daily.    Historical Provider, MD  baclofen (LIORESAL) 10 MG tablet  01/26/16   Historical Provider, MD  BAYER CONTOUR TEST test strip TEST ONCE D UTD 01/12/16   Historical Provider, MD  calcium carbonate (OS-CAL) 600 MG TABS tablet Take 600 mg by mouth daily with breakfast.    Historical Provider, MD  colchicine 0.6 MG tablet Take 1 tablet (0.6 mg total) by mouth 2 (two) times daily as needed (gout). 03/07/16   Oswald Hillock, MD  gabapentin (NEURONTIN) 100 MG capsule  01/26/16   Historical Provider, MD  glimepiride (AMARYL) 4 MG tablet Take 4 mg by mouth 2 (two) times daily.    Historical Provider, MD  HYDROcodone-acetaminophen (NORCO/VICODIN) 5-325 MG tablet Take 1 tablet by mouth every 6 (six) hours as needed. 03/07/16   Oswald Hillock, MD  insulin aspart (NOVOLOG) 100 UNIT/ML injection Inject 0-9 Units into the skin 3 (three) times daily with meals. 03/07/16   Oswald Hillock, MD  metFORMIN (GLUCOPHAGE) 500 MG tablet Take 500 mg by mouth 4 (four) times daily.     Historical Provider, MD  methocarbamol (ROBAXIN) 500 MG tablet Take 1 tablet (500 mg total) by mouth every 6 (six) hours as needed for muscle spasms. 03/07/16   Oswald Hillock, MD  omeprazole (PRILOSEC) 20 MG capsule Take 20 mg by mouth daily.    Historical Provider, MD  Polyethyl Glycol-Propyl Glycol (SYSTANE OP) Apply 1 drop to eye daily as needed. For dry eyes    Historical Provider, MD  ramipril (ALTACE) 10 MG capsule Take 10 mg by mouth daily.      Historical Provider, MD   BP  129/55 mmHg  Pulse 85  Temp(Src) 98.1 F (36.7 C) (Oral)  Resp 18  SpO2 95% Physical Exam  Constitutional: She appears well-developed and well-nourished. No distress.  HENT:  Head: Normocephalic.  Eyes: Conjunctivae are normal.  Neck: Neck supple.  Cardiovascular: Normal rate, regular rhythm and normal heart sounds.   Pulmonary/Chest: Effort normal and breath sounds normal. No respiratory distress. She has  no wheezes. She has no rales.  Abdominal: Soft. Bowel sounds are normal. She exhibits no distension. There is no tenderness. There is no rebound.  Musculoskeletal: She exhibits no edema.  Left great toe is surgically absent. The incision to the medial aspect of the foot, just medial to the metatarsals, appears to be draining purulent drainage. There is surrounding cellulitis over medial foot and extending over the dorsum and plantar surface of the medial foot. Mild tenderness to palpation. Dorsal pedal pulses intact.  Neurological: She is alert.  Skin: Skin is warm and dry.  Psychiatric: She has a normal mood and affect. Her behavior is normal.  Nursing note and vitals reviewed.   ED Course  Procedures (including critical care time) Labs Review Labs Reviewed - No data to display  Imaging Review Dg Foot Complete Left  04/02/2016  CLINICAL DATA:  Increased redness left foot for a few days for wound check. Left great toe amputated 1 month ago. EXAM: LEFT FOOT - COMPLETE 3+ VIEW COMPARISON:  03/01/2016, 10/21/2015 FINDINGS: There is evidence of patient's first toe amputation distal to the medial cuneiform. Minimal degenerate change over the midfoot. No acute fracture or dislocation. No significant air within the soft tissues. Mild prominence of the plantar soft tissues of the distal foot. IMPRESSION: Evidence of recent first toe amputation distal to the medial cuneiform bone. Mild soft tissue swelling along the plantar aspect of the distal foot. No air in the soft tissues. Electronically  Signed   By: Marin Olp M.D.   On: 04/02/2016 16:04   I have personally reviewed and evaluated these images and lab results as part of my medical decision-making.   EKG Interpretation None      MDM   Final diagnoses:  Cellulitis of left foot  Wound infection ([place])   Patient with left foot first ray amputation on 03/04/16. She is here with what appears to be a wound infection and cellulitis of the foot. She is afebrile, normal vital signs, no evidence of sepsis at this time. Will check labs and do x-ray.  7:54 PM X-ray shows no gas. Vital signs and labs are reassuring. Will admit for IV antibiotics. Vancomycin and Zosyn ordered here. I spoke with [facility] who will admit patient. I have also paged orthopedics doctor but have not heard from them at this time.  8:17 PM Spoke with Dr. Marlou Sa who is on-call for Dr. Sharol Given. Will relate the message for consult. No need for NPO at this time  Filed Vitals:   04/02/16 1534 04/02/16 1700 04/02/16 1800  BP: 147/58 129/55 132/55  Pulse: 95 85 86  Temp: 98.1 F (36.7 C)    TempSrc: Oral    Resp: 18    SpO2: 95% 95% 96%     Jeannett Senior, PA-C 04/05/16 0827  Forde Dandy, MD 04/05/16 1932

## 2016-04-02 NOTE — ED Notes (Signed)
Food given  Now c/o nausea

## 2016-04-02 NOTE — ED Notes (Signed)
Mri called for this pt  She has 45 minutes to go on her vancomycin  They will re schedule

## 2016-04-02 NOTE — ED Notes (Signed)
The pt had a lt great toe amputated 5 weeks ago  She was [facility] until wed.  Since then she has family and a home health nurse coming each day to dress the foot..   Since yesterday the wound has been red and swollen with yellow drainage.  The nurse saw this am and it was worse she suggested that the pt come to the hosp,  Good pedal pulses both feet  Lt harder to get dopplered  Ok   Redness surround the wound and extends up the leg to just below the mid tib fib  Also the leg is warmer to touch in that area

## 2016-04-02 NOTE — ED Notes (Signed)
Unable to get an iv placed at present edp at the bedside

## 2016-04-03 ENCOUNTER — Encounter (HOSPITAL_COMMUNITY): Payer: Medicare Other

## 2016-04-03 DIAGNOSIS — E119 Type 2 diabetes mellitus without complications: Secondary | ICD-10-CM

## 2016-04-03 DIAGNOSIS — Z9841 Cataract extraction status, right eye: Secondary | ICD-10-CM | POA: Diagnosis not present

## 2016-04-03 DIAGNOSIS — E11628 Type 2 diabetes mellitus with other skin complications: Secondary | ICD-10-CM | POA: Diagnosis present

## 2016-04-03 DIAGNOSIS — K219 Gastro-esophageal reflux disease without esophagitis: Secondary | ICD-10-CM | POA: Diagnosis not present

## 2016-04-03 DIAGNOSIS — Z981 Arthrodesis status: Secondary | ICD-10-CM | POA: Diagnosis not present

## 2016-04-03 DIAGNOSIS — S91302A Unspecified open wound, left foot, initial encounter: Secondary | ICD-10-CM | POA: Diagnosis present

## 2016-04-03 DIAGNOSIS — Z7982 Long term (current) use of aspirin: Secondary | ICD-10-CM | POA: Diagnosis not present

## 2016-04-03 DIAGNOSIS — Z794 Long term (current) use of insulin: Secondary | ICD-10-CM | POA: Diagnosis not present

## 2016-04-03 DIAGNOSIS — I10 Essential (primary) hypertension: Secondary | ICD-10-CM | POA: Diagnosis present

## 2016-04-03 DIAGNOSIS — Z85828 Personal history of other malignant neoplasm of skin: Secondary | ICD-10-CM | POA: Diagnosis not present

## 2016-04-03 DIAGNOSIS — M1990 Unspecified osteoarthritis, unspecified site: Secondary | ICD-10-CM | POA: Diagnosis present

## 2016-04-03 DIAGNOSIS — E1169 Type 2 diabetes mellitus with other specified complication: Secondary | ICD-10-CM | POA: Diagnosis not present

## 2016-04-03 DIAGNOSIS — E876 Hypokalemia: Secondary | ICD-10-CM | POA: Diagnosis not present

## 2016-04-03 DIAGNOSIS — M4806 Spinal stenosis, lumbar region: Secondary | ICD-10-CM | POA: Diagnosis not present

## 2016-04-03 DIAGNOSIS — Z96652 Presence of left artificial knee joint: Secondary | ICD-10-CM | POA: Diagnosis present

## 2016-04-03 DIAGNOSIS — Z85820 Personal history of malignant melanoma of skin: Secondary | ICD-10-CM | POA: Diagnosis not present

## 2016-04-03 DIAGNOSIS — L089 Local infection of the skin and subcutaneous tissue, unspecified: Secondary | ICD-10-CM | POA: Diagnosis not present

## 2016-04-03 DIAGNOSIS — E1151 Type 2 diabetes mellitus with diabetic peripheral angiopathy without gangrene: Secondary | ICD-10-CM | POA: Diagnosis present

## 2016-04-03 DIAGNOSIS — T148 Other injury of unspecified body region: Secondary | ICD-10-CM | POA: Diagnosis not present

## 2016-04-03 DIAGNOSIS — Z961 Presence of intraocular lens: Secondary | ICD-10-CM | POA: Diagnosis present

## 2016-04-03 DIAGNOSIS — L03116 Cellulitis of left lower limb: Secondary | ICD-10-CM | POA: Diagnosis not present

## 2016-04-03 DIAGNOSIS — T8744 Infection of amputation stump, left lower extremity: Secondary | ICD-10-CM | POA: Diagnosis present

## 2016-04-03 DIAGNOSIS — Z9842 Cataract extraction status, left eye: Secondary | ICD-10-CM | POA: Diagnosis not present

## 2016-04-03 LAB — MAGNESIUM: MAGNESIUM: 1 mg/dL — AB (ref 1.7–2.4)

## 2016-04-03 LAB — CBC
HEMATOCRIT: 31.3 % — AB (ref 36.0–46.0)
HEMOGLOBIN: 10 g/dL — AB (ref 12.0–15.0)
MCH: 28.2 pg (ref 26.0–34.0)
MCHC: 31.9 g/dL (ref 30.0–36.0)
MCV: 88.4 fL (ref 78.0–100.0)
Platelets: 205 10*3/uL (ref 150–400)
RBC: 3.54 MIL/uL — ABNORMAL LOW (ref 3.87–5.11)
RDW: 14.7 % (ref 11.5–15.5)
WBC: 9.6 10*3/uL (ref 4.0–10.5)

## 2016-04-03 LAB — GLUCOSE, CAPILLARY
GLUCOSE-CAPILLARY: 111 mg/dL — AB (ref 65–99)
GLUCOSE-CAPILLARY: 127 mg/dL — AB (ref 65–99)
GLUCOSE-CAPILLARY: 166 mg/dL — AB (ref 65–99)
GLUCOSE-CAPILLARY: 168 mg/dL — AB (ref 65–99)
GLUCOSE-CAPILLARY: 129 mg/dL — AB (ref 65–99)

## 2016-04-03 LAB — BASIC METABOLIC PANEL
ANION GAP: 7 (ref 5–15)
BUN: 12 mg/dL (ref 6–20)
CHLORIDE: 109 mmol/L (ref 101–111)
CO2: 22 mmol/L (ref 22–32)
Calcium: 8.4 mg/dL — ABNORMAL LOW (ref 8.9–10.3)
Creatinine, Ser: 0.88 mg/dL (ref 0.44–1.00)
GFR calc Af Amer: >60 mL/min (ref >60)
GFR, EST NON AFRICAN AMERICAN: 58 mL/min — AB (ref >60)
GLUCOSE: 173 mg/dL — AB (ref 65–99)
POTASSIUM: 4.1 mmol/L (ref 3.5–5.1)
Sodium: 138 mmol/L (ref 135–145)

## 2016-04-03 SURGERY — Surgical Case
Anesthesia: *Unknown

## 2016-04-03 MED ORDER — DIPHENHYDRAMINE HCL 50 MG/ML IJ SOLN
25.0000 mg | Freq: Once | INTRAMUSCULAR | Status: AC
  Administered 2016-04-03: 25 mg via INTRAVENOUS
  Filled 2016-04-03: qty 1

## 2016-04-03 MED ORDER — COLLAGENASE 250 UNIT/GM EX OINT
TOPICAL_OINTMENT | Freq: Every day | CUTANEOUS | Status: DC
  Administered 2016-04-03 – 2016-04-06 (×4): via TOPICAL
  Filled 2016-04-03 (×4): qty 30

## 2016-04-03 MED ORDER — MAGNESIUM SULFATE 2 GM/50ML IV SOLN
2.0000 g | Freq: Once | INTRAVENOUS | Status: AC
  Administered 2016-04-03: 2 g via INTRAVENOUS
  Filled 2016-04-03: qty 50

## 2016-04-03 NOTE — Progress Notes (Signed)
Messaged Dr. Aggie Moats about patient breaking out in rash on legs. He ordered Benadryl IV.  Just gave it.  Will inform night shift to continue to monitor.

## 2016-04-03 NOTE — Consult Note (Signed)
WOC consulted for left foot neuropathic foot ulcer, chart reviewed.  Ortho consulted as well and has written wound care orders for this patient.  WOC will not consult for this reason.   Re consult if needed, will not follow at this time. Thanks  Nazim Kadlec Kellogg, [place] [phone])

## 2016-04-03 NOTE — Evaluation (Signed)
Occupational Therapy Evaluation Patient Details Name: [name] MRN: [id number] DOB: 08-16-1928 Today's Date: 04/03/2016    History of Present Illness [name] is a 82 y.o. female, With history of osteoarthritis, diabetes mellitus, hypertension, gout, GERD who came to the hospital with redness pain and swelling of left lower extremity, s/p L first ray amputation. D/C to SNF and returned home x 3 days. Readmitted with infection of wound site.   Clinical Impression   Pt was assisted for IADL and ambulating with a rollator and [employer]AR[employer] shoe prior to admission. Her sister assisted with IADL and L foot dressing changes. Pt presents with generalized weakness and impaired standing balance. She requires min guard assist for mobility, but was able to maintain NWB on L LE with youth size walker. Will follow acutely.     Follow Up Recommendations  Home health OT (pt needs to manage 6 steps to enter her home)   Equipment Recommendations  None recommended by OT    Recommendations for Other Services       Precautions / Restrictions Precautions Precautions: Fall Restrictions Weight Bearing Restrictions: Yes LLE Weight Bearing: Non weight bearing      Mobility Bed Mobility Overal bed mobility: Modified Independent             General bed mobility comments: used rail, HOB up slightly  Transfers Overall transfer level: Needs assistance Equipment used: Rolling walker (2 wheeled) Transfers: Sit to/from Stand Sit to Stand: Min guard         General transfer comment: pt able to maintain NWB status short distances    Balance                                            ADL Overall ADL's : Needs assistance/impaired Eating/Feeding: Independent;Sitting   Grooming: Wash/dry hands;Wash/dry face;Sitting;Set up   Upper Body Bathing: Set up;Sitting   Lower Body Bathing: Min guard;Sit to/from stand   Upper Body Dressing : Set up;Sitting   Lower Body  Dressing: Min guard;Sit to/from stand   Toilet Transfer: Min guard;Ambulation;RW   Toileting- Water quality scientist and Hygiene: Min guard;Sit to/from stand       Functional mobility during ADLs: Min guard;Rolling walker (youth size)       Vision     Perception     Praxis      Pertinent Vitals/Pain Pain Assessment: No/denies pain     Hand Dominance Right   Extremity/Trunk Assessment Upper Extremity Assessment Upper Extremity Assessment: Generalized weakness   Lower Extremity Assessment Lower Extremity Assessment: Defer to PT evaluation       Communication Communication Communication: HOH   Cognition Arousal/Alertness: Awake/alert Behavior During Therapy: WFL for tasks assessed/performed Overall Cognitive Status: Within Functional Limits for tasks assessed                     General Comments       Exercises       Shoulder Instructions      Home Living Family/patient expects to be discharged to:: Private residence Living Arrangements: Alone Available Help at Discharge: Family;Available PRN/intermittently (sister lives nearby) Type of Home: House Home Access: Stairs to enter [employer] of Steps: 6 Entrance Stairs-Rails: Right Home Layout: One level     Bathroom Shower/Tub: Teacher, early years/pre: Standard     Home Equipment: Environmental consultant - 2 wheels;Bedside commode;Cane -  single point;Shower seat;Hand held Tourist information centre manager - 4 wheels          Prior Functioning/Environment Level of Independence: Needs assistance  Gait / Transfers Assistance Needed: was walking with DARCO shoe and rollator ADL's / Homemaking Assistance Needed: sister helped with dressing changes and IADL   Comments: Pt had HHRN. HHPT came out, but pt refused further therapy.    OT Diagnosis: Generalized weakness   OT Problem List: Decreased strength;Decreased activity tolerance;Impaired balance (sitting and/or standing);Decreased knowledge of use  of DME or AE   OT Treatment/Interventions: Self-care/ADL training;DME and/or AE instruction;Patient/family education;Balance training;Therapeutic exercise    OT Goals(Current goals can be found in the care plan section) Acute Rehab OT Goals Patient Stated Goal: return home OT Goal Formulation: With patient Time For Goal Achievement: 04/10/16 Potential to Achieve Goals: Good ADL Goals Pt Will Perform Grooming: with modified independence;standing Pt Will Perform Lower Body Bathing: with modified independence;sit to/from stand Pt Will Perform Lower Body Dressing: with modified independence;sit to/from stand Pt Will Transfer to Toilet: with modified independence;ambulating;regular height toilet Pt Will Perform Toileting - Clothing Manipulation and hygiene: with modified independence;sit to/from stand Pt Will Perform Tub/Shower Transfer: Tub transfer;with supervision;shower seat;rolling walker;anterior/posterior transfer Pt/caregiver will Perform Home Exercise Program: Increased strength;Both right and left upper extremity;With theraband;Independently (level 2)  OT Frequency: Min 2X/week   Barriers to D/C: Decreased caregiver support          Co-evaluation PT/OT/SLP Co-Evaluation/Treatment: Yes Reason for Co-Treatment: For patient/therapist safety   OT goals addressed during session: ADL's and self-care      End of Session Equipment Utilized During Treatment: Gait belt;Rolling walker  Activity Tolerance: Patient tolerated treatment well Patient left: in chair;with call bell/phone within reach;with chair alarm set   Time: [id number] OT Time Calculation (min): 33 min Charges:  OT General Charges $OT Visit: 1 Procedure OT Evaluation $OT Eval Low Complexity: 1 Procedure G-Codes: OT G-codes **NOT FOR INPATIENT CLASS** Functional Assessment Tool Used: clinical judgement Functional Limitation: Self care Self Care Current Status [id number]): At least 20 percent but less than 40 percent  impaired, limited or restricted Self Care Goal Status [id number]): At least 1 percent but less than 20 percent impaired, limited or restricted  [name] 04/03/2016, 9:51 AM  [phone]

## 2016-04-03 NOTE — Evaluation (Signed)
Physical Therapy Evaluation Patient Details Name: [name] MRN: [id number] DOB: [birth year] Today's Date: 04/03/2016   History of Present Illness  [name] is a 82 y.o. female, With history of osteoarthritis, diabetes mellitus, hypertension, gout, GERD who came to the hospital with redness pain and swelling of left lower extremity, s/p L first ray amputation. D/C to SNF and returned home x 3 days. Readmitted with infection of wound site.  Clinical Impression  Pt admitted with above diagnosis. Pt currently with functional limitations due to the deficits listed below (see PT Problem List). At the time of PT eval pt was able to perform transfers and minimal ambulation with gross min guard assist. Pt looking to return home at d/c, and will need to negotiate 6 stairs to enter home. Pt will benefit from skilled PT to increase their independence and safety with mobility to allow discharge to the venue listed below.       Follow Up Recommendations Home health PT;Supervision for mobility/OOB (Will likely refuse)    Equipment Recommendations  Rolling walker with 5" wheels (Youth size)    Recommendations for Other Services       Precautions / Restrictions Precautions Precautions: Fall Restrictions Weight Bearing Restrictions: Yes LLE Weight Bearing: Non weight bearing      Mobility  Bed Mobility Overal bed mobility: Modified Independent             General bed mobility comments: used rail, HOB up slightly  Transfers Overall transfer level: Needs assistance Equipment used: Rolling walker (2 wheeled) Transfers: Sit to/from Stand Sit to Stand: Min guard         General transfer comment: VC's for NWB status on the L side. Pt able to power-up to full standing position without assistance.   Ambulation/Gait Ambulation/Gait assistance: Min guard Ambulation Distance (Feet): 20 Feet Assistive device: Rolling walker (2 wheeled) Gait Pattern/deviations: Step-to  pattern;Decreased stride length Gait velocity: Decreased Gait velocity interpretation: Below normal speed for age/gender General Gait Details: Pt able to maintain NWB status on the L for short distances. Youth RW used for increased leverage.   Stairs            Wheelchair Mobility    Modified Rankin (Stroke Patients Only)       Balance Overall balance assessment: Needs assistance Sitting-balance support: Feet supported;No upper extremity supported Sitting balance-Leahy Scale: Good     Standing balance support: Bilateral upper extremity supported;During functional activity Standing balance-Leahy Scale: Poor Standing balance comment: Due to NWB status, pt requires UE support on RW for standing balance.                              Pertinent Vitals/Pain Pain Assessment: No/denies pain    Home Living Family/patient expects to be discharged to:: Private residence Living Arrangements: Alone Available Help at Discharge: Family;Available PRN/intermittently (sister lives nearby) Type of Home: House Home Access: Stairs to enter Entrance Stairs-Rails: Right Entrance Stairs-Number of Steps: 6 Home Layout: One level Home Equipment: Environmental consultant - 2 wheels;Bedside commode;Cane - single point;Shower seat;Hand held Tourist information centre manager - 4 wheels      Prior Function Level of Independence: Needs assistance   Gait / Transfers Assistance Needed: was walking with [facility] shoe and rollator  ADL's / Homemaking Assistance Needed: sister helped with dressing changes and IADL  Comments: Pt had HHRN. HHPT came out, but pt refused further therapy.     Hand Dominance   Dominant Hand: Right  Extremity/Trunk Assessment   Upper Extremity Assessment: Defer to OT evaluation           Lower Extremity Assessment: Generalized weakness      Cervical / Trunk Assessment: Kyphotic  Communication   Communication: HOH  Cognition Arousal/Alertness: Awake/alert Behavior During  Therapy: WFL for tasks assessed/performed Overall Cognitive Status: Within Functional Limits for tasks assessed                      General Comments      Exercises        Assessment/Plan    PT Assessment Patient needs continued PT services  PT Diagnosis Difficulty walking   PT Problem List Decreased strength;Decreased range of motion;Decreased activity tolerance;Decreased balance;Decreased mobility;Decreased knowledge of use of DME;Decreased safety awareness;Decreased knowledge of precautions  PT Treatment Interventions DME instruction;Gait training;Functional mobility training;Therapeutic activities;Stair training;Therapeutic exercise;Neuromuscular re-education;Patient/family education   PT Goals (Current goals can be found in the Care Plan section) Acute Rehab PT Goals Patient Stated Goal: return home PT Goal Formulation: With patient Time For Goal Achievement: 04/10/16 Potential to Achieve Goals: Good    Frequency Min 4X/week   Barriers to discharge Inaccessible home environment Pt has 6 steps to enter her home    Co-evaluation PT/OT/SLP Co-Evaluation/Treatment: Yes Reason for Co-Treatment: For patient/therapist safety PT goals addressed during session: Mobility/safety with mobility;Balance;Proper use of DME OT goals addressed during session: ADL's and self-care       End of Session Equipment Utilized During Treatment: Gait belt Activity Tolerance: Patient tolerated treatment well Patient left: in chair;with call bell/phone within reach;with chair alarm set Nurse Communication: Mobility status    Functional Assessment Tool Used: Clinical judgement Functional Limitation: Mobility: Walking and moving around Mobility: Walking and Moving Around Current Status [phone]): At least 1 percent but less than 20 percent impaired, limited or restricted Mobility: Walking and Moving Around Goal Status [phone]): At least 1 percent but less than 20 percent impaired, limited  or restricted    Time: 0903-0936 PT Time Calculation (min) (ACUTE ONLY): 33 min   Charges:   PT Evaluation $PT Eval Moderate Complexity: 1 Procedure     PT G Codes:   PT G-Codes **NOT FOR INPATIENT CLASS** Functional Assessment Tool Used: Clinical judgement Functional Limitation: Mobility: Walking and moving around Mobility: Walking and Moving Around Current Status [id number]): At least 1 percent but less than 20 percent impaired, limited or restricted Mobility: Walking and Moving Around Goal Status [phone]): At least 1 percent but less than 20 percent impaired, limited or restricted    Rolinda Roan 04/03/2016, 10:55 AM   Rolinda Roan, PT, DPT Acute Rehabilitation Services Pager: [phone]

## 2016-04-03 NOTE — Care Management Obs Status (Signed)
[facility] NOTIFICATION   Patient Details  Name: [name] MRN: [id number] Date of Birth: 1928-04-02   Medicare Observation Status Notification Given:  Yes    Ninfa Meeker, RN 04/03/2016, 10:09 AM

## 2016-04-03 NOTE — Progress Notes (Signed)
[facility] PROGRESS NOTE  [name] [id number] DOB: 10-27-1927 DOA: 04/02/2016 PCP: Jani Gravel, MD  Assessment/Plan: Diabetic foot infection and wound infection in left foot: pt seems to have wound infection given redness, swelling and warmth. pt is not septic, hemodynamically stable.  - will place on tele bed - IV vancomycin and Zosyn (04/02/16) were started in ED continued - PRN Zofran for nausea, Norco for pain - Blood cultures x 2 - P - ESR and CRP - neg - wound care consult - MRI-left foot - showed small abscess; these results were discussed with the patient - will get Procalcitonin and trend lactic acid levels - nl - IVF: 100 cc/h - INR/PTT/type - will get ABI - f/u ortho recommendations  GERD: -Protonix  Spinal stenosis of lumbar region at multiple levels: -prn Norco  DM-II: Last A1c 8.4 on 03/01/13 , poorly controled. Patient is taking metformin, acarbose, Amaryl, NovoLog at home -SSI -Check A1c  Hypokalemia: K= 3.4 on admission. - Repleted - Check Mg level  Low Mg - low, replaced - will recheck  DVT ppx: SQ Heparin (pt may need surgery, Heparin is easier to be stopped) Code Status: Full code Family Communication: Yes, patient's sister at bed side Disposition Plan: Anticipate discharge back to previous home environment Consults called: Ortho will consulted (Dr. Sharol Given is on call) Admission status: medical floor/obs Consultants:  Ortho  Procedures:  none  Antibiotics:  See above (indicate start date, and stop date if known)  HPI/Subjective: Pt doing well. Denies fever, N/V and SOB.  Later in day pt had itchy legs. Order given for benadryl VI.  Objective: Filed Vitals:   04/02/16 2127 04/03/16 0321  BP: 129/50 107/50  Pulse:  85  Temp: 97.7 F (36.5 C) 98.4 F (36.9 C)  Resp:  16    Intake/Output Summary (Last 24 hours) at 04/03/16 0853 Last data filed at 04/03/16 0534  Gross per 24 hour  Intake   1415 ml  Output       0 ml  Net   1415 ml   There were no vitals filed for this visit.  Exam:   General:  No diaphoresis, anxious, no acute distress  Cardiovascular: Regular rate and rhythm no murmurs rubs or gallops  Respiratory: Clear to auscultation bilaterally no more breathing  Abdomen: Nondistended bowel sounds normal nontender palpation  Musculoskeletal: Moving all extremities, no deformity, 5 out of 5 strength   Skin - L foot in special wrap to protect wound, CDI   Data Reviewed: Basic Metabolic Panel:  Recent Labs Lab 04/02/16 1808 04/03/16 0441  NA 136 138  K 3.4* 4.1  CL 107 109  CO2 21* 22  GLUCOSE 68 173*  BUN 10 12  CREATININE 0.58 0.88  CALCIUM 9.2 8.4*  MG  --  1.0*   Liver Function Tests: No results for input(s): AST, ALT, ALKPHOS, BILITOT, PROT, ALBUMIN in the last 168 hours. No results for input(s): LIPASE, AMYLASE in the last 168 hours. No results for input(s): AMMONIA in the last 168 hours. CBC:  Recent Labs Lab 04/02/16 1808 04/03/16 0441  WBC 8.3 9.6  NEUTROABS 5.1  --   HGB 10.9* 10.0*  HCT 34.1* 31.3*  MCV 87.0 88.4  PLT 242 205   Cardiac Enzymes: No results for input(s): CKTOTAL, CKMB, CKMBINDEX, TROPONINI in the last 168 hours. BNP (last 3 results) No results for input(s): BNP in the last 8760 hours.  ProBNP (last 3 results) No results for input(s): PROBNP in the  last 8760 hours.  CBG:  Recent Labs Lab 04/02/16 2004 04/02/16 2153 04/03/16 0627  GLUCAP 73 111* 127*    No results found for this or any previous visit (from the past 240 hour(s)).   Studies: Mr Foot Left W Wo Contrast  04/03/2016  CLINICAL DATA:  Patient was recently hospitalized from 5/175/23 due to abscess of first MTP joint. She is s/p of left great toe amputation. She completed 2-week of Augmentin. She just went home from rehabilitation facility 3 days ago. Patient states there is drainage coming from the wound. EXAM: MRI OF THE LEFT FOREFOOT WITHOUT AND WITH  CONTRAST TECHNIQUE: Multiplanar, multisequence MR imaging was performed both before and after administration of intravenous contrast. CONTRAST:  31m MULTIHANCE GADOBENATE DIMEGLUMINE 529 MG/ML IV SOLN COMPARISON:  03/03/2016 FINDINGS: Bones/Joint/Cartilage There is amputation of the first metatarsal and phalanges. Multiple small complex peripherally enhancing fluid collections along the medial aspect of the foot in the region of the first metatarsal resection bed with the largest measuring 1.1 x 0.9 x 1.3 cm. Small areas of susceptibility artifact are noted within the soft tissues which may reflect a small amount of air. A small fluid collection abuts the distal medial cuneiforms with minimal subcortical marrow changes and enhancement without cortical destruction which may reflect reactive marrow edema versus early osteomyelitis. No fracture or dislocation. Normal alignment. No joint effusion. Mild osteoarthritis of the second and third TMT joints. Collaterals Collateral ligaments are intact. Tendons Flexor and extensor compartment tendons are intact. Muscles Normal. Soft tissue Generalized soft tissue edema with enhancement along the dorsal medial aspect of the foot. IMPRESSION: 1. Multiple small complex peripherally enhancing fluid collections along the medial aspect of the foot in the region of the first metatarsal resection bed with the largest measuring 1.1 x 0.9 x 1.3 cm most concerning for small abscesses. Small areas of susceptibility artifact are noted within the soft tissues concerning for small amount of air. A small fluid collection abuts the distal medial cuneiforms with minimal subcortical marrow changes and enhancement without cortical destruction which may reflect reactive marrow edema versus early osteomyelitis. Electronically Signed   By: HKathreen Devoid  On: 04/03/2016 08:13   Dg Foot Complete Left  04/02/2016  CLINICAL DATA:  Increased redness left foot for a few days for wound check. Left  great toe amputated 1 month ago. EXAM: LEFT FOOT - COMPLETE 3+ VIEW COMPARISON:  03/01/2016, 10/21/2015 FINDINGS: There is evidence of patient's first toe amputation distal to the medial cuneiform. Minimal degenerate change over the midfoot. No acute fracture or dislocation. No significant air within the soft tissues. Mild prominence of the plantar soft tissues of the distal foot. IMPRESSION: Evidence of recent first toe amputation distal to the medial cuneiform bone. Mild soft tissue swelling along the plantar aspect of the distal foot. No air in the soft tissues. Electronically Signed   By: DMarin OlpM.D.   On: 04/02/2016 16:04    Scheduled Meds: . allopurinol  100 mg Oral Daily  . amLODipine  5 mg Oral Daily  . aspirin EC  81 mg Oral BID  . calcium carbonate  1,250 mg Oral Q breakfast  . collagenase   Topical Daily  . feeding supplement (ENSURE ENLIVE)  237 mL Oral BID BM  . heparin  5,000 Units Subcutaneous Q8H  . insulin aspart  0-5 Units Subcutaneous QHS  . insulin aspart  0-9 Units Subcutaneous TID WC  . magnesium sulfate 1 - 4 g bolus IVPB  2 g Intravenous Once  . pantoprazole  40 mg Oral Daily  . piperacillin-tazobactam (ZOSYN)  IV  3.375 g Intravenous Q8H  . ramipril  10 mg Oral Daily  . vancomycin  500 mg Intravenous Q12H   Continuous Infusions: . sodium chloride 100 mL/hr at 04/03/16 0022    Principal Problem:   Diabetic foot infection ([place]) Active Problems:   Esophageal reflux   Spinal stenosis of lumbar region at multiple levels   Diabetes mellitus ([place])   Hypokalemia   Wound infection ([place])    Time spent: [facility] Pager [phone]. If 7PM-7AM, please contact night-coverage at [email or web address], password [facility] 04/03/2016, 8:53 AM

## 2016-04-03 NOTE — Progress Notes (Signed)
Initial Nutrition Assessment  DOCUMENTATION CODES:   Not applicable  INTERVENTION:  Continue Ensure Enlive po BID, each supplement provides 350 kcal and 20 grams of protein.  Encourage adequate PO intake.   Recommend obtaining new weight to fully assess weight trends.   NUTRITION DIAGNOSIS:   Increased nutrient needs related to wound healing as evidenced by estimated needs.  GOAL:   Patient will meet greater than or equal to 90% of their needs  MONITOR:   PO intake, Supplement acceptance, Weight trends, Labs, I & O's, Skin  REASON FOR ASSESSMENT:   Consult Wound healing  ASSESSMENT:   82 y.o. female with medical history significant of recently s/p of left great toe amputation due to abscess first MTP joint, diabetes mellitus, GERD, gout, chronic back pain, melanoma, who presents with left foot non-healing wound and infection.  Meal completion has been 50%. Pt reports appetite has been decreased since her amputation 1 month ago. Pt reports she has been having poor po intake however unable to describe amount of food eaten at meals. Usual body weight reported to be ~145 lbs. Pt with no significant weight loss. Pt currently has Ensure ordered and has been consuming them. RD to continue with current orders. Pt encouraged to eat her food at meals.   Pt with no significant fat or muscle mass loss observed.   Labs and medications reviewed.   Diet Order:  Diet Carb Modified Fluid consistency:: Thin; Room service appropriate?: Yes  Skin:  Wound (see comment) (cellulitis L foot)  Last BM:  6/18  Height:   Ht Readings from Last 1 Encounters:  10/15/15 5\' 2"  (1.575 m)    Weight:   Wt Readings from Last 1 Encounters:  03/06/16 153 lb 6.4 oz (69.582 kg)    Ideal Body Weight:  50 kg  BMI:  There is no weight on file to calculate BMI.  Estimated Nutritional Needs:   Kcal:  1700-1850  Protein:  75-85 grams  Fluid:  1.7 - 1.8 L/day  EDUCATION NEEDS:   No education  needs identified at this time  Corrin Parker, MS, RD, LDN Pager # [phone] After hours/ weekend pager # [phone]

## 2016-04-03 NOTE — Consult Note (Signed)
ORTHOPAEDIC CONSULTATION  REQUESTING PHYSICIAN: Elwin Mocha, MD  Chief Complaint: Left foot wound status post first ray amputation  HPI: [name] is a 82 y.o. female who presents with some redness around the foot wound status post first ray amputation. Patient states that the wound care ointment was wrapped around her foot and that after this was performed she had increasing burning and the foot and increasing redness.  Past Medical History  Diagnosis Date  . Osteoarthritis   . Degenerative joint disease   . Lumbar spinal stenosis   . Diabetes mellitus, type 2 ([place])   . Hypertension   . Gout   . GERD (gastroesophageal reflux disease)   . Fundic gland polyps of stomach, benign   . Helicobacter pylori gastritis   . Shingles     current shingles- x4 yrs., on L side of face     . Cancer (HCC)     melanoma- R arm , area excised   . Tubular adenoma of colon 07/2011  . Hiatal hernia    Past Surgical History  Procedure Laterality Date  . Total knee arthroplasty      left  . Lumbar laminectomy      6 prior back surgery   . Cholecystectomy  1998  . Vaginal hysterectomy  1990's  . Cataract extraction      bilateral, /w IOL  . Retinal laser procedure      bilateral  . Rotator cuff repair      bilateral  . Carpal tunnel release      bilateral  . Hemorrhoid surgery      x 2  . Tonsillectomy    . Benign br. biopsy- many yrs. ago    . Spinal fusion  08/22/2011    Procedure: FUSION POSTERIOR SPINAL MULTILEVEL/SCOLIOSIS;  Surgeon: Gunnar Bulla;  Location: [place];  Service: Orthopedics;  Laterality: N/A;  LUMBAR LAMINECTOMY, EXTENSION OF POSTERIOR LUMBAR FUSION TO T10 WITH K2M RODS, SCREWS, CONNECTORS, HOOKS, ILIAC CREST BONE GRAFT  . Amputation Left 03/04/2016    Procedure: FOOT FIRST RAY AMPUTATION;  Surgeon: Newt Minion, MD;  Location: [place];  Service: Orthopedics;  Laterality: Left;   Social History   Social History  . Marital Status: Divorced    Spouse  Name: N/A  . Number of Children: N/A  . Years of Education: N/A   Social History Main Topics  . Smoking status: Never Smoker   . Smokeless tobacco: Never Used  . Alcohol Use: No  . Drug Use: No  . Sexual Activity: Not Asked   Other Topics Concern  . None   Social History Narrative   Family History  Problem Relation Age of Onset  . Colon cancer Neg Hx   . Anesthesia problems Neg Hx   . Hypotension Neg Hx   . Malignant hyperthermia Neg Hx   . Pseudochol deficiency Neg Hx   . Diabetes Mother   . Cancer Mother     mother  . Diabetes Paternal Aunt     x 1  . Diabetes Paternal Uncle     x 3  . Diabetes Brother   . Diabetes Other     neice  . Heart disease Father   . Cancer Father     gallbladder  . Liver cancer Father   . Heart disease Brother   . Colon polyps Sister    - negative except otherwise stated in the family history section Allergies  Allergen Reactions  .  Celebrex [Celecoxib] Swelling  . Morphine Sulfate Nausea Only  . Percocet [Oxycodone-Acetaminophen] Itching   Prior to Admission medications   Medication Sig Start Date End Date Taking? Authorizing Provider  acarbose (PRECOSE) 25 MG tablet Take 25 mg by mouth daily. 03/31/16  Yes Historical Provider, MD  allopurinol (ZYLOPRIM) 100 MG tablet Take 1 tablet (100 mg total) by mouth daily. 03/07/16  Yes Oswald Hillock, MD  amLODipine (NORVASC) 5 MG tablet Take 5 mg by mouth daily.     Yes Historical Provider, MD  aspirin EC 81 MG tablet Take 81 mg by mouth 2 (two) times daily.    Yes Historical Provider, MD  calcium carbonate (OS-CAL) 600 MG TABS tablet Take 600 mg by mouth daily with breakfast.   Yes Historical Provider, MD  colchicine 0.6 MG tablet Take 1 tablet (0.6 mg total) by mouth 2 (two) times daily as needed (gout). 03/07/16  Yes Oswald Hillock, MD  glimepiride (AMARYL) 4 MG tablet Take 4 mg by mouth 2 (two) times daily.   Yes Historical Provider, MD  HYDROcodone-acetaminophen (NORCO/VICODIN) 5-325 MG tablet  Take 1 tablet by mouth every 6 (six) hours as needed. 03/07/16  Yes Oswald Hillock, MD  metFORMIN (GLUCOPHAGE) 1000 MG tablet Take 1,000 mg by mouth 2 (two) times daily. 02/21/16  Yes Historical Provider, MD  methocarbamol (ROBAXIN) 500 MG tablet Take 1 tablet (500 mg total) by mouth every 6 (six) hours as needed for muscle spasms. 03/07/16  Yes Oswald Hillock, MD  omeprazole (PRILOSEC) 20 MG capsule Take 20 mg by mouth daily.   Yes Historical Provider, MD  Polyethyl Glycol-Propyl Glycol (SYSTANE OP) Apply 1 drop to eye daily as needed. For dry eyes   Yes Historical Provider, MD  ramipril (ALTACE) 10 MG capsule Take 10 mg by mouth daily.     Yes Historical Provider, MD  insulin aspart (NOVOLOG) 100 UNIT/ML injection Inject 0-9 Units into the skin 3 (three) times daily with meals. 03/07/16   Oswald Hillock, MD   Dg Foot Complete Left  04/02/2016  CLINICAL DATA:  Increased redness left foot for a few days for wound check. Left great toe amputated 1 month ago. EXAM: LEFT FOOT - COMPLETE 3+ VIEW COMPARISON:  03/01/2016, 10/21/2015 FINDINGS: There is evidence of patient's first toe amputation distal to the medial cuneiform. Minimal degenerate change over the midfoot. No acute fracture or dislocation. No significant air within the soft tissues. Mild prominence of the plantar soft tissues of the distal foot. IMPRESSION: Evidence of recent first toe amputation distal to the medial cuneiform bone. Mild soft tissue swelling along the plantar aspect of the distal foot. No air in the soft tissues. Electronically Signed   By: Marin Olp M.D.   On: 04/02/2016 16:04   - pertinent xrays, CT, MRI studies were reviewed and independently interpreted  Positive ROS: All other systems have been reviewed and were otherwise negative with the exception of those mentioned in the HPI and as above.  Physical Exam: General: Alert, no acute distress Cardiovascular: No pedal edema Respiratory: No cyanosis, no use of accessory  musculature GI: No organomegaly, abdomen is soft and non-tender Skin: Open wound with good granulation tissue there is some mild redness around the edges but there is no signs of abscess there is a good pulse no necrosis of the wound. Neurologic: Patient does not have protective sensation. Psychiatric: Patient is competent for consent with normal mood and affect Lymphatic: No axillary or cervical lymphadenopathy  MUSCULOSKELETAL:  Examination the wound has some mild redness but there is no signs of any deep abscess. Patient may a dermatitis from the ointment applied to the skin or may have mild cellulitis. She has a good dorsalis pedis pulse.  Assessment: Assessment: Periwound irritation possible cellulitis left foot status post first ray amputation.  Plan: Plan: Would recommend continue several days of IV antibiotics discharged to skilled nursing on oral antibiotics Santyl dressing changes to the wound only. Minimize weightbearing ideally nonweightbearing on the left lower extremity.  Thank you for the consult and the opportunity to see Ms. Scheryl Darter, MD [facility] [phone] 6:50 AM

## 2016-04-04 ENCOUNTER — Inpatient Hospital Stay (HOSPITAL_COMMUNITY): Payer: Medicare Other

## 2016-04-04 DIAGNOSIS — E1169 Type 2 diabetes mellitus with other specified complication: Secondary | ICD-10-CM

## 2016-04-04 DIAGNOSIS — T148 Other injury of unspecified body region: Secondary | ICD-10-CM

## 2016-04-04 DIAGNOSIS — E876 Hypokalemia: Secondary | ICD-10-CM

## 2016-04-04 DIAGNOSIS — L089 Local infection of the skin and subcutaneous tissue, unspecified: Secondary | ICD-10-CM

## 2016-04-04 DIAGNOSIS — M4806 Spinal stenosis, lumbar region: Secondary | ICD-10-CM

## 2016-04-04 LAB — RENAL FUNCTION PANEL
ALBUMIN: 2.7 g/dL — AB (ref 3.5–5.0)
ANION GAP: 8 (ref 5–15)
BUN: 12 mg/dL (ref 6–20)
CALCIUM: 8.8 mg/dL — AB (ref 8.9–10.3)
CO2: 23 mmol/L (ref 22–32)
Chloride: 110 mmol/L (ref 101–111)
Creatinine, Ser: 0.77 mg/dL (ref 0.44–1.00)
GFR calc Af Amer: >60 mL/min (ref >60)
GLUCOSE: 84 mg/dL (ref 65–99)
PHOSPHORUS: 3.3 mg/dL (ref 2.5–4.6)
POTASSIUM: 3.8 mmol/L (ref 3.5–5.1)
SODIUM: 141 mmol/L (ref 135–145)

## 2016-04-04 LAB — GLUCOSE, CAPILLARY
GLUCOSE-CAPILLARY: 96 mg/dL (ref 65–99)
GLUCOSE-CAPILLARY: 182 mg/dL — AB (ref 65–99)
GLUCOSE-CAPILLARY: 192 mg/dL — AB (ref 65–99)
Glucose-Capillary: 213 mg/dL — ABNORMAL HIGH (ref 65–99)

## 2016-04-04 LAB — CBC WITH DIFFERENTIAL/PLATELET
BASOS ABS: 0 10*3/uL (ref 0.0–0.1)
BASOS PCT: 0 %
EOS ABS: 0.8 10*3/uL — AB (ref 0.0–0.7)
Eosinophils Relative: 12 %
HEMATOCRIT: 30.9 % — AB (ref 36.0–46.0)
HEMOGLOBIN: 10 g/dL — AB (ref 12.0–15.0)
Lymphocytes Relative: 23 %
Lymphs Abs: 1.6 10*3/uL (ref 0.7–4.0)
MCH: 29.2 pg (ref 26.0–34.0)
MCHC: 32.4 g/dL (ref 30.0–36.0)
MCV: 90.1 fL (ref 78.0–100.0)
Monocytes Absolute: 0.4 10*3/uL (ref 0.1–1.0)
Monocytes Relative: 6 %
NEUTROS ABS: 4 10*3/uL (ref 1.7–7.7)
NEUTROS PCT: 59 %
Platelets: 184 10*3/uL (ref 150–400)
RBC: 3.43 MIL/uL — ABNORMAL LOW (ref 3.87–5.11)
RDW: 14.8 % (ref 11.5–15.5)
WBC: 6.8 10*3/uL (ref 4.0–10.5)

## 2016-04-04 LAB — HEMOGLOBIN A1C
Hgb A1c MFr Bld: 6.5 % — ABNORMAL HIGH (ref 4.8–5.6)
Mean Plasma Glucose: 140 mg/dL

## 2016-04-04 LAB — MAGNESIUM: Magnesium: 1.5 mg/dL — ABNORMAL LOW (ref 1.7–2.4)

## 2016-04-04 MED ORDER — SENNOSIDES-DOCUSATE SODIUM 8.6-50 MG PO TABS
1.0000 | ORAL_TABLET | Freq: Two times a day (BID) | ORAL | Status: DC
  Administered 2016-04-04 – 2016-04-06 (×4): 1 via ORAL
  Filled 2016-04-04 (×4): qty 1

## 2016-04-04 MED ORDER — MAGNESIUM SULFATE 2 GM/50ML IV SOLN
2.0000 g | Freq: Once | INTRAVENOUS | Status: AC
  Administered 2016-04-04: 2 g via INTRAVENOUS
  Filled 2016-04-04: qty 50

## 2016-04-04 MED ORDER — DIPHENHYDRAMINE HCL 25 MG PO CAPS
25.0000 mg | ORAL_CAPSULE | Freq: Four times a day (QID) | ORAL | Status: DC | PRN
  Administered 2016-04-04 – 2016-04-05 (×4): 25 mg via ORAL
  Filled 2016-04-04 (×4): qty 1

## 2016-04-04 MED ORDER — POLYETHYLENE GLYCOL 3350 17 G PO PACK
17.0000 g | PACK | Freq: Every day | ORAL | Status: DC
  Administered 2016-04-05 – 2016-04-06 (×2): 17 g via ORAL
  Filled 2016-04-04 (×2): qty 1

## 2016-04-04 NOTE — Care Management Important Message (Signed)
Important Message  Patient Details  Name: [name] MRN: [id number] Date of Birth: 1927/11/12   Medicare Important Message Given:  Yes    Ainsley Deakins, [place] 04/04/2016, 8:22 AM

## 2016-04-04 NOTE — Progress Notes (Signed)
   04/04/16 1035  Clinical Encounter Type  Visited With Patient and family together  Visit Type Initial  Stress Factors  Patient Stress Factors Not reviewed  Family Stress Factors Not reviewed  Chaplain made initial visit on morning rounds.  Chaplain made addition support available if needed.

## 2016-04-04 NOTE — Progress Notes (Addendum)
Patient ID: PANAGIOTA FAUL, female   DOB: Jul 31, 1928, 82 y.o.   MRN: [id number]   PROGRESS NOTE    LAURENDA STEPLER  [id number] DOB: [birth year] DOA: 04/02/2016  PCP: Jani Gravel, MD   Brief Narrative:  82 y.o. Female with diabetes mellitus, GERD, gout, chronic back pain, melanoma, who presents with left foot non-healing wound and infection, recently hospitalized from 5/17-5/23 due to abscess of first MTP joint. She is s/p of left treat toe amptation.   Assessment/Plan: Left foot cellulitis, status post first ray amputation. - Dr. Sharol Given recommended to continue several days of IV antibiotics discharged to skilled nursing on oral antibiotics Santyl dressing changes to the wound only.  - Minimize weightbearing ideally nonweightbearing on the left lower extremity. - IV vancomycin and Zosyn (04/02/16), day #3 - PRN Zofran for nausea, Norco for pain  GERD -Protonix  Spinal stenosis of lumbar region at multiple levels: - prn Norco  DM-II with complications of PVD, neuropathy  - Last A1c 8.4 on 03/01/13 - Patient is taking metformin, acarbose, Amaryl, NovoLog at home -Check A1c 6.5  Hypokalemia - supplemented and WNL this AM  Low Mg - level pending this AM  DVT prophylaxis: Heparin SQ Code Status: Full  Family Communication: Patient at bedside  Disposition Plan: Plan d/c in 1-2 days   Consultants:   Dr. Sharol Given - Ortho   Procedures:   None  Antimicrobials:   Vancomycin and Zosyn 6/18 -->   Subjective: Reports feeling better.  Objective: Filed Vitals:   04/03/16 1500 04/03/16 2106 04/03/16 2327 04/04/16 0550  BP: 112/38 103/32 105/36 127/40  Pulse: 78 84  76  Temp: 98.6 F (37 C) 99.9 F (37.7 C)  98 F (36.7 C)  TempSrc:  Oral  Oral  Resp: 16 15  15   SpO2: 95% 90%  94%    Intake/Output Summary (Last 24 hours) at 04/04/16 [id number] Last data filed at 04/03/16 2220  Gross per 24 hour  Intake    700 ml  Output      2 ml  Net    698 ml   There were no vitals  filed for this visit.  Examination:  General exam: Appears calm and comfortable  Respiratory system: Clear to auscultation. Respiratory effort normal. Cardiovascular system: S1 & S2 heard, RRR. No JVD, rubs, gallops or clicks. No pedal edema. Gastrointestinal system: Abdomen is nondistended, soft and nontender. No organomegaly or masses felt. Normal bowel sounds heard. Extremities: left foot looks better but still swollen with less erythema and less TTP, 2 cm open wound with yellowish discharge and middle area with small amount of blood   Data Reviewed: I have personally reviewed following labs and imaging studies  CBC:  Recent Labs Lab 04/02/16 1808 04/03/16 0441 04/04/16 0404  WBC 8.3 9.6 6.8  NEUTROABS 5.1  --  4.0  HGB 10.9* 10.0* 10.0*  HCT 34.1* 31.3* 30.9*  MCV 87.0 88.4 90.1  PLT 242 205 [id number]   Basic Metabolic Panel:  Recent Labs Lab 04/02/16 1808 04/03/16 0441 04/04/16 0404  NA 136 138 141  K 3.4* 4.1 3.8  CL 107 109 110  CO2 21* 22 23  GLUCOSE 68 173* 84  BUN 10 12 12   CREATININE 0.58 0.88 0.77  CALCIUM 9.2 8.4* 8.8*  MG  --  1.0*  --   PHOS  --   --  3.3   Liver Function Tests:  Recent Labs Lab 04/04/16 0404  ALBUMIN 2.7*    Coagulation Profile:  Recent Labs Lab 04/02/16 2055  INR 1.12   HbA1C:  Recent Labs  04/02/16 2055  HGBA1C 6.5*   CBG:  Recent Labs Lab 04/03/16 0627 04/03/16 1123 04/03/16 1648 04/03/16 2144 04/04/16 0628  GLUCAP 127* 166* 168* 129* 96    Recent Results (from the past 240 hour(s))  Culture, blood (x 2)     Status: None (Preliminary result)   Collection Time: 04/02/16  8:45 PM  Result Value Ref Range Status   Specimen Description BLOOD RIGHT ANTECUBITAL  Final   Special Requests BOTTLES DRAWN AEROBIC AND ANAEROBIC 5CC  Final   Culture NO GROWTH < 24 HOURS  Final   Report Status PENDING  Incomplete  Culture, blood (x 2)     Status: None (Preliminary result)   Collection Time: 04/02/16  8:55 PM    Result Value Ref Range Status   Specimen Description BLOOD RIGHT HAND  Final   Special Requests BOTTLES DRAWN AEROBIC AND ANAEROBIC 5CC  Final   Culture NO GROWTH < 24 HOURS  Final   Report Status PENDING  Incomplete      Radiology Studies: Mr Foot Left W Wo Contrast  04/03/2016  CLINICAL DATA:  Patient was recently hospitalized from 5/175/23 due to abscess of first MTP joint. She is s/p of left great toe amputation. She completed 2-week of Augmentin. She just went home from rehabilitation facility 3 days ago. Patient states there is drainage coming from the wound. EXAM: MRI OF THE LEFT FOREFOOT WITHOUT AND WITH CONTRAST TECHNIQUE: Multiplanar, multisequence MR imaging was performed both before and after administration of intravenous contrast. CONTRAST:  2mL MULTIHANCE GADOBENATE DIMEGLUMINE 529 MG/ML IV SOLN COMPARISON:  03/03/2016 FINDINGS: Bones/Joint/Cartilage There is amputation of the first metatarsal and phalanges. Multiple small complex peripherally enhancing fluid collections along the medial aspect of the foot in the region of the first metatarsal resection bed with the largest measuring 1.1 x 0.9 x 1.3 cm. Small areas of susceptibility artifact are noted within the soft tissues which may reflect a small amount of air. A small fluid collection abuts the distal medial cuneiforms with minimal subcortical marrow changes and enhancement without cortical destruction which may reflect reactive marrow edema versus early osteomyelitis. No fracture or dislocation. Normal alignment. No joint effusion. Mild osteoarthritis of the second and third TMT joints. Collaterals Collateral ligaments are intact. Tendons Flexor and extensor compartment tendons are intact. Muscles Normal. Soft tissue Generalized soft tissue edema with enhancement along the dorsal medial aspect of the foot. IMPRESSION: 1. Multiple small complex peripherally enhancing fluid collections along the medial aspect of the foot in the region  of the first metatarsal resection bed with the largest measuring 1.1 x 0.9 x 1.3 cm most concerning for small abscesses. Small areas of susceptibility artifact are noted within the soft tissues concerning for small amount of air. A small fluid collection abuts the distal medial cuneiforms with minimal subcortical marrow changes and enhancement without cortical destruction which may reflect reactive marrow edema versus early osteomyelitis. Electronically Signed   By: Kathreen Devoid   On: 04/03/2016 08:13   Dg Foot Complete Left  04/02/2016  CLINICAL DATA:  Increased redness left foot for a few days for wound check. Left great toe amputated 1 month ago. EXAM: LEFT FOOT - COMPLETE 3+ VIEW COMPARISON:  03/01/2016, 10/21/2015 FINDINGS: There is evidence of patient's first toe amputation distal to the medial cuneiform. Minimal degenerate change over the midfoot. No acute fracture or dislocation. No significant air within the soft tissues.  Mild prominence of the plantar soft tissues of the distal foot. IMPRESSION: Evidence of recent first toe amputation distal to the medial cuneiform bone. Mild soft tissue swelling along the plantar aspect of the distal foot. No air in the soft tissues. Electronically Signed   By: Marin Olp M.D.   On: 04/02/2016 16:04      Scheduled Meds: . allopurinol  100 mg Oral Daily  . amLODipine  5 mg Oral Daily  . aspirin EC  81 mg Oral BID  . calcium carbonate  1,250 mg Oral Q breakfast  . collagenase   Topical Daily  . feeding supplement (ENSURE ENLIVE)  237 mL Oral BID BM  . heparin  5,000 Units Subcutaneous Q8H  . insulin aspart  0-5 Units Subcutaneous QHS  . insulin aspart  0-9 Units Subcutaneous TID WC  . pantoprazole  40 mg Oral Daily  . piperacillin-tazobactam (ZOSYN)  IV  3.375 g Intravenous Q8H  . ramipril  10 mg Oral Daily  . vancomycin  500 mg Intravenous Q12H   Continuous Infusions: . sodium chloride 100 mL/hr at 04/03/16 0022     LOS: 1 day    Time  spent: 20 minutes    [name], MD [facility] Pager [phone]  If 7PM-7AM, please contact night-coverage [email or web address] Password [facility] 04/04/2016, 6:52 AM

## 2016-04-04 NOTE — Progress Notes (Signed)
VASCULAR LAB PRELIMINARY  ARTERIAL  ABI completed: Bilateral within normal limits.    RIGHT    LEFT    PRESSURE WAVEFORM  PRESSURE WAVEFORM  BRACHIAL 135 Bi BRACHIAL 142 Bi  DP   DP    AT 146 Bi AT 127 Bi  PT 169 Bi PT 151 Bi  PER   PER    GREAT TOE  NA GREAT TOE  NA    RIGHT LEFT  ABI 1.19 1.06     Landry Mellow, RDMS, RVT  04/04/2016, 3:06 PM

## 2016-04-04 NOTE — Progress Notes (Signed)
Physical Therapy Treatment Patient Details Name: [name] MRN: [id number] DOB: 1928-09-13 Today's Date: 04/04/2016    History of Present Illness Keisa Yandyke is a 82 y.o. female, With history of osteoarthritis, diabetes mellitus, hypertension, gout, GERD who came to the hospital with redness pain and swelling of left lower extremity, s/p L first ray amputation. D/C to SNF and returned home x 3 days. Readmitted with infection of wound site.    PT Comments    Pt making functional progress towards physical therapy goals. Overall pt very angry and frustrated during session today surrounding stair training. Pt does not appear to understand that she will not be able to enter home while maintaining NWB status facing forward or sideways with single rail for support. Does not want to utilize backwards technique with RW as she feels she will fall. Therapist attempted extensive education regarding safety with stair negotiation as well as need to maintain NWB status, however pt not engaged in the conversation and appeared to become more and more angry. Handout issued with instructions for stair management with RW. Will attempt continued practice and education for safe entry into home.   Follow Up Recommendations  Home health PT;Supervision for mobility/OOB (Will likely refuse)     Equipment Recommendations  Rolling walker with 5" wheels (Youth size)    Recommendations for Other Services       Precautions / Restrictions Precautions Precautions: Fall Restrictions Weight Bearing Restrictions: Yes LLE Weight Bearing: Non weight bearing    Mobility  Bed Mobility               General bed mobility comments: Pt sitting up in recliner upon PT arrival.   Transfers Overall transfer level: Needs assistance Equipment used: Rolling walker (2 wheeled) Transfers: Sit to/from Stand Sit to Stand: Supervision         General transfer comment: VC's for NWB status. Pt was able to  complete without assistance.   Ambulation/Gait Ambulation/Gait assistance: Supervision Ambulation Distance (Feet): 20 Feet Assistive device: Rolling walker (2 wheeled) Gait Pattern/deviations: Step-to pattern;Decreased stride length;Trunk flexed Gait velocity: Decreased Gait velocity interpretation: Below normal speed for age/gender General Gait Details: Minimal distance to/from practice stairs to conserve energy.    Stairs Stairs: Yes Stairs assistance: Min assist Stair Management: No rails;Backwards;With walker Number of Stairs: 5 General stair comments: Extensive education regarding backwards technique to enter home. Pt has 1 rail on the right side and is unable to maintain NWB to ascend forwards. Pt became very frustrated and agitated regarding stair training backwards with walker and states she will fall. Overall did well completing stairs but does not want to enter home this way at d/c.   Wheelchair Mobility    Modified Rankin (Stroke Patients Only)       Balance Overall balance assessment: Needs assistance Sitting-balance support: Feet supported;No upper extremity supported Sitting balance-Leahy Scale: Good     Standing balance support: Bilateral upper extremity supported;During functional activity Standing balance-Leahy Scale: Poor Standing balance comment: Due to NWB status, pt requires UE support on RW for standing balance.                     Cognition Arousal/Alertness: Awake/alert Behavior During Therapy: Agitated Overall Cognitive Status: Within Functional Limits for tasks assessed                      Exercises      General Comments        Pertinent Vitals/Pain  Pain Assessment: Faces Faces Pain Scale: No hurt    Home Living                      Prior Function            PT Goals (current goals can now be found in the care plan section) Acute Rehab PT Goals Patient Stated Goal: return home PT Goal Formulation: With  patient Time For Goal Achievement: 04/10/16 Potential to Achieve Goals: Good Progress towards PT goals: Progressing toward goals    Frequency  Min 4X/week    PT Plan Current plan remains appropriate    Co-evaluation             End of Session Equipment Utilized During Treatment: Gait belt Activity Tolerance: Patient tolerated treatment well Patient left: in chair;with call bell/phone within reach;with family/visitor present     Time: [id number] PT Time Calculation (min) (ACUTE ONLY): 27 min  Charges:  $Gait Training: 23-37 mins                    G Codes:      Rolinda Roan 01-May-2016, 1:16 PM  Rolinda Roan, PT, DPT Acute Rehabilitation Services Pager: [phone]

## 2016-04-05 DIAGNOSIS — L03116 Cellulitis of left lower limb: Secondary | ICD-10-CM

## 2016-04-05 LAB — BASIC METABOLIC PANEL
Anion gap: 6 (ref 5–15)
BUN: 10 mg/dL (ref 6–20)
CHLORIDE: 107 mmol/L (ref 101–111)
CO2: 27 mmol/L (ref 22–32)
CREATININE: 0.79 mg/dL (ref 0.44–1.00)
Calcium: 9.3 mg/dL (ref 8.9–10.3)
GFR calc Af Amer: >60 mL/min (ref >60)
GFR calc non Af Amer: >60 mL/min (ref >60)
Glucose, Bld: 147 mg/dL — ABNORMAL HIGH (ref 65–99)
Potassium: 4.4 mmol/L (ref 3.5–5.1)
Sodium: 140 mmol/L (ref 135–145)

## 2016-04-05 LAB — GLUCOSE, CAPILLARY
GLUCOSE-CAPILLARY: 149 mg/dL — AB (ref 65–99)
GLUCOSE-CAPILLARY: 161 mg/dL — AB (ref 65–99)
Glucose-Capillary: 140 mg/dL — ABNORMAL HIGH (ref 65–99)
Glucose-Capillary: 211 mg/dL — ABNORMAL HIGH (ref 65–99)

## 2016-04-05 LAB — CBC
HEMATOCRIT: 31.2 % — AB (ref 36.0–46.0)
HEMOGLOBIN: 10.1 g/dL — AB (ref 12.0–15.0)
MCH: 28.6 pg (ref 26.0–34.0)
MCHC: 32.4 g/dL (ref 30.0–36.0)
MCV: 88.4 fL (ref 78.0–100.0)
Platelets: 218 10*3/uL (ref 150–400)
RBC: 3.53 MIL/uL — ABNORMAL LOW (ref 3.87–5.11)
RDW: 14.7 % (ref 11.5–15.5)
WBC: 6.8 10*3/uL (ref 4.0–10.5)

## 2016-04-05 LAB — MAGNESIUM: Magnesium: 1.9 mg/dL (ref 1.7–2.4)

## 2016-04-05 MED ORDER — ALPRAZOLAM 0.25 MG PO TABS
0.2500 mg | ORAL_TABLET | Freq: Two times a day (BID) | ORAL | Status: DC | PRN
  Administered 2016-04-05: 0.25 mg via ORAL
  Filled 2016-04-05: qty 1

## 2016-04-05 MED ORDER — DOXYCYCLINE HYCLATE 100 MG PO TABS: 100.0000 mg | ORAL_TABLET | Freq: Two times a day (BID) | ORAL | Status: DC

## 2016-04-05 MED ORDER — DOXYCYCLINE HYCLATE 100 MG PO TABS
100.0000 mg | ORAL_TABLET | Freq: Two times a day (BID) | ORAL | Status: DC
  Administered 2016-04-05 – 2016-04-06 (×2): 100 mg via ORAL
  Filled 2016-04-05 (×2): qty 1

## 2016-04-05 MED ORDER — TRAZODONE HCL 50 MG PO TABS
50.0000 mg | ORAL_TABLET | Freq: Every day | ORAL | Status: DC
  Administered 2016-04-05: 50 mg via ORAL
  Filled 2016-04-05: qty 1

## 2016-04-05 MED ORDER — COLLAGENASE 250 UNIT/GM EX OINT: TOPICAL_OINTMENT | Freq: Every day | CUTANEOUS | Status: DC

## 2016-04-05 MED ORDER — HYDROCODONE-ACETAMINOPHEN 5-325 MG PO TABS: 1.0000 | ORAL_TABLET | Freq: Four times a day (QID) | ORAL | Status: DC | PRN

## 2016-04-05 NOTE — Discharge Instructions (Signed)

## 2016-04-05 NOTE — Clinical Social Work Note (Signed)
LCSW received report from RN stating patient to return to [facility] at time of discharge. LCSW contacted [facility] who stated patient was discharged from their facility on 03/29/2016 and facility does not anticipate patient to return. Per facility, there are currently no bed available.  LCSW met with patient at bedside to discuss discharge plan. Patient states patient wants to discharge home and has been anticipating a discharge home. Patient expressed concerns regarding wound care to patient's foot and also a rash on patient's legs. Patient informed LCSW that patient would like to receive clarity on wound care and cause of rash prior to discharge. LCSW educated patient on MD discharge order for home on 04/05/2016.  LCSW contacted MD regarding patient's concerns.  RN updated regarding information above.  Lubertha Sayres, [facility]: [phone] Surgical: [phone]

## 2016-04-05 NOTE — Progress Notes (Signed)
Pt with no IV at start of shift . Day shift nursing infomed this RN that the physician has discontinued all IV medicines and that there was no need to place a new IV.

## 2016-04-05 NOTE — Discharge Summary (Addendum)
Physician Discharge Summary  [name] [id number] DOB: 04/19/1928 DOA: 04/02/2016  PCP: Jani Gravel, MD  Admit date: 04/02/2016 Discharge date: 04/05/2016  Recommendations for Outpatient Follow-up:  1. Pt will need to follow up with PCP in 1-2 weeks post discharge 2. Pt will need to see Dr. Sharol Given in 1 week for follow up  3. Continue Doxycycline upon discharge to complete therapy  4. Clean wound with soap and water and apply Santyl dressing daily  Discharge Diagnoses:  Principal Problem:   Diabetic foot infection ([place]), Cellulitis, left foot   Discharge Condition: Stable  Diet recommendation: Heart healthy diet discussed in details   Brief Narrative:  82 y.o. Female with diabetes mellitus, GERD, gout, chronic back pain, melanoma, who presents with left foot non-healing wound and infection, recently hospitalized from 5/17-5/23 due to abscess of first MTP joint. She is s/p of left treat toe amptation.  Assessment/Plan: Left foot cellulitis, status post first ray amputation - left foot looks better but still swollen with less erythema and less TTP, 2 cm open wound with yellowish drainage still noted  - Dr. Sharol Given recommended to d/c on oral antibiotics Santyl dressing changes to the wound, follow up in one week  - Minimize weight bearing, ideally nonweightbearing on the left lower extremity. - IV vancomycin and Zosyn (04/02/16), today is day #4, transition to oral Doxy upon discharge  - ok to use analgesia as needed for pain   GERD -Protonix  Spinal stenosis of lumbar region at multiple levels: - prn Norco  DM-II with complications of PVD, neuropathy  - Last A1c 8.4 on 03/01/13 - Patient is taking metformin, acarbose, Amaryl, NovoLog at home - A1c 6.5 this hospital stay   Hypokalemia - supplemented and WNL this AM  Low Mg - supplemented and WNL this AM  DVT prophylaxis: Heparin SQ Code Status: Full  Family Communication: Patient at bedside, son at bedside in am  and sister at bedside in the evening, pt asked to be seen by Dr. Sharol Given specifically but since she requested this late in the evening, will hold off on AM, Dr. Sharol Given to see in AM Disposition Plan: Home  Consultants:   Dr. Sharol Given - Ortho  Procedures:   None  Antimicrobials:   Vancomycin and Zosyn 6/18 --> transition to oral doxycycline   Discharge Exam: Filed Vitals:   04/05/16 0623 04/05/16 0905  BP: 121/36 124/51  Pulse: 78   Temp: 98.2 F (36.8 C)   Resp: 17    Filed Vitals:   04/04/16 1300 04/04/16 2015 04/05/16 0623 04/05/16 0905  BP: 130/42 110/32 121/36 124/51  Pulse: 78 78 78   Temp: 98.1 F (36.7 C) 98.2 F (36.8 C) 98.2 F (36.8 C)   TempSrc: Oral Oral Oral   Resp: 16 16 17    SpO2: 96% 96% 93%     General: Pt is alert, follows commands appropriately, not in acute distress Cardiovascular: Regular rate and rhythm, no rubs, no gallops Respiratory: Clear to auscultation bilaterally, no wheezing, no crackles, no rhonchi Abdominal: Soft, non tender, non distended, bowel sounds +, no guarding Extremities: left foot looks better but still swollen with less erythema and less TTP, 2 cm open wound with yellowish discharge and middle area with small amount of blood   Discharge Instructions  Discharge Instructions    Diet - low sodium heart healthy    Complete by:  As directed      Increase activity slowly    Complete by:  As directed  Medication List    TAKE these medications        acarbose 25 MG tablet  Commonly known as:  PRECOSE  Take 25 mg by mouth daily.     allopurinol 100 MG tablet  Commonly known as:  ZYLOPRIM  Take 1 tablet (100 mg total) by mouth daily.     amLODipine 5 MG tablet  Commonly known as:  NORVASC  Take 5 mg by mouth daily.     aspirin EC 81 MG tablet  Take 81 mg by mouth 2 (two) times daily.     calcium carbonate 600 MG Tabs tablet  Commonly known as:  OS-CAL  Take 600 mg by mouth daily with breakfast.      colchicine 0.6 MG tablet  Take 1 tablet (0.6 mg total) by mouth 2 (two) times daily as needed (gout).     collagenase ointment  Commonly known as:  SANTYL  Apply topically daily. Apply to the affected area after wound cleaned with soap and water     doxycycline 100 MG tablet  Commonly known as:  VIBRA-TABS  Take 1 tablet (100 mg total) by mouth 2 (two) times daily.     glimepiride 4 MG tablet  Commonly known as:  AMARYL  Take 4 mg by mouth 2 (two) times daily.     HYDROcodone-acetaminophen 5-325 MG tablet  Commonly known as:  NORCO/VICODIN  Take 1 tablet by mouth every 6 (six) hours as needed.     insulin aspart 100 UNIT/ML injection  Commonly known as:  novoLOG  Inject 0-9 Units into the skin 3 (three) times daily with meals.     metFORMIN 1000 MG tablet  Commonly known as:  GLUCOPHAGE  Take 1,000 mg by mouth 2 (two) times daily.     methocarbamol 500 MG tablet  Commonly known as:  ROBAXIN  Take 1 tablet (500 mg total) by mouth every 6 (six) hours as needed for muscle spasms.     omeprazole 20 MG capsule  Commonly known as:  PRILOSEC  Take 20 mg by mouth daily.     ramipril 10 MG capsule  Commonly known as:  ALTACE  Take 10 mg by mouth daily.     SYSTANE OP  Apply 1 drop to eye daily as needed. For dry eyes           Follow-up Information    Follow up with DUDA,MARCUS V, MD In 1 week.   Specialty:  Orthopedic Surgery   Contact information:   [place] [address] [phone]       Follow up with Jani Gravel, MD.   Specialty:  Internal Medicine   Contact information:   [place] [address] [phone]       Call [name], MD.   Specialty:  Internal Medicine   Why:  As needed   Contact information:   [address] [place] [address] [phone]        The results of significant diagnostics from this hospitalization (including imaging, microbiology, ancillary and  laboratory) are listed below for reference.     Microbiology: Recent Results (from the past 240 hour(s))  Culture, blood (x 2)     Status: None (Preliminary result)   Collection Time: 04/02/16  8:45 PM  Result Value Ref Range Status   Specimen Description BLOOD RIGHT ANTECUBITAL  Final   Special Requests BOTTLES DRAWN AEROBIC AND ANAEROBIC 5CC  Final   Culture  NO GROWTH 2 DAYS  Final   Report Status PENDING  Incomplete  Culture, blood (x 2)     Status: None (Preliminary result)   Collection Time: 04/02/16  8:55 PM  Result Value Ref Range Status   Specimen Description BLOOD RIGHT HAND  Final   Special Requests BOTTLES DRAWN AEROBIC AND ANAEROBIC 5CC  Final   Culture NO GROWTH 2 DAYS  Final   Report Status PENDING  Incomplete     Labs: Basic Metabolic Panel:  Recent Labs Lab 04/02/16 1808 04/03/16 0441 04/04/16 0404 04/04/16 0827 04/05/16 0540  NA 136 138 141  --  140  K 3.4* 4.1 3.8  --  4.4  CL 107 109 110  --  107  CO2 21* 22 23  --  27  GLUCOSE 68 173* 84  --  147*  BUN 10 12 12   --  10  CREATININE 0.58 0.88 0.77  --  0.79  CALCIUM 9.2 8.4* 8.8*  --  9.3  MG  --  1.0*  --  1.5* 1.9  PHOS  --   --  3.3  --   --    Liver Function Tests:  Recent Labs Lab 04/04/16 0404  ALBUMIN 2.7*   CBC:  Recent Labs Lab 04/02/16 1808 04/03/16 0441 04/04/16 0404 04/05/16 0540  WBC 8.3 9.6 6.8 6.8  NEUTROABS 5.1  --  4.0  --   HGB 10.9* 10.0* 10.0* 10.1*  HCT 34.1* 31.3* 30.9* 31.2*  MCV 87.0 88.4 90.1 88.4  PLT 242 205 184 218    CBG:  Recent Labs Lab 04/04/16 0628 04/04/16 1144 04/04/16 1606 04/04/16 2040 04/05/16 0646  GLUCAP 96 213* 182* 192* 140*   SIGNED: Time coordinating discharge: 30 minutes  MAGICK-Shadee Rathod, MD  [facility] 04/05/2016, 9:59 AM Pager [phone]  If 7PM-7AM, please contact night-coverage [email or web address] Password [email or web address]

## 2016-04-06 LAB — CBC
HCT: 34.1 % — ABNORMAL LOW (ref 36.0–46.0)
HEMOGLOBIN: 10.8 g/dL — AB (ref 12.0–15.0)
MCH: 28.1 pg (ref 26.0–34.0)
MCHC: 31.7 g/dL (ref 30.0–36.0)
MCV: 88.8 fL (ref 78.0–100.0)
Platelets: 247 10*3/uL (ref 150–400)
RBC: 3.84 MIL/uL — ABNORMAL LOW (ref 3.87–5.11)
RDW: 14.5 % (ref 11.5–15.5)
WBC: 6.8 10*3/uL (ref 4.0–10.5)

## 2016-04-06 LAB — BASIC METABOLIC PANEL
Anion gap: 10 (ref 5–15)
BUN: 11 mg/dL (ref 6–20)
CALCIUM: 9.7 mg/dL (ref 8.9–10.3)
CHLORIDE: 105 mmol/L (ref 101–111)
CO2: 23 mmol/L (ref 22–32)
CREATININE: 0.71 mg/dL (ref 0.44–1.00)
GFR calc non Af Amer: >60 mL/min (ref >60)
Glucose, Bld: 157 mg/dL — ABNORMAL HIGH (ref 65–99)
Potassium: 4.4 mmol/L (ref 3.5–5.1)
SODIUM: 138 mmol/L (ref 135–145)

## 2016-04-06 LAB — GLUCOSE, CAPILLARY
GLUCOSE-CAPILLARY: 164 mg/dL — AB (ref 65–99)
Glucose-Capillary: 212 mg/dL — ABNORMAL HIGH (ref 65–99)

## 2016-04-06 NOTE — Progress Notes (Signed)
Physical Therapy Treatment Patient Details Name: [name] MRN: [id number] DOB: 02-02-1928 Today's Date: 04/06/2016    History of Present Illness [name] is a 82 y.o. female, With history of osteoarthritis, diabetes mellitus, hypertension, gout, GERD who came to the hospital with redness pain and swelling of left lower extremity, s/p L first ray amputation. D/C to SNF and returned home x 3 days. Readmitted with infection of wound site.    PT Comments    Discussion with pt and daughter about safety on stairs and importance of NWBing as pt has been unable to complete stair training.  Pt ed on possibility of using a W/C with 2 person A to go up/down stairs and benefits of having W/C in her home.  Pt indicated feeling very happy with W/C option as did daughter.  Notified RN and Case Manager of need for W/C.  Pt is ready for D/C from PT perspective.    Follow Up Recommendations  Home health PT;Supervision for mobility/OOB     Equipment Recommendations  Rolling walker with 5" wheels;Wheelchair (measurements PT);Wheelchair cushion (measurements PT) (Youth size RW)    Recommendations for Other Services       Precautions / Restrictions Precautions Precautions: Fall Restrictions Weight Bearing Restrictions: Yes LLE Weight Bearing: Non weight bearing    Mobility  Bed Mobility Overal bed mobility: Modified Independent                Transfers   Equipment used: Rolling walker (2 wheeled)   Sit to Stand: Modified independent (Device/Increase time)         General transfer comment: good adherence to NWB status  Ambulation/Gait                 Stairs         General stair comments: pt and daughter education and discussion about importance of NWBing while on stairs.  Discussed potential for using a W/C and 2 person A, which pt and daughter were both very agreeable to.  Discussed technique with stairs.    Wheelchair Mobility    Modified Rankin  (Stroke Patients Only)       Balance     Sitting balance-Leahy Scale: Good       Standing balance-Leahy Scale: Poor                      Cognition Arousal/Alertness: Awake/alert Behavior During Therapy: WFL for tasks assessed/performed Overall Cognitive Status: Within Functional Limits for tasks assessed                      Exercises      General Comments        Pertinent Vitals/Pain Pain Assessment: Faces Faces Pain Scale: No hurt    Home Living                      Prior Function            PT Goals (current goals can now be found in the care plan section) Acute Rehab PT Goals Patient Stated Goal: return home PT Goal Formulation: With patient Time For Goal Achievement: 04/10/16 Potential to Achieve Goals: Good Progress towards PT goals: Progressing toward goals    Frequency  Min 4X/week    PT Plan Current plan remains appropriate    Co-evaluation             End of Session   Activity Tolerance: Patient tolerated treatment  well Patient left: in chair;with call bell/phone within reach;with family/visitor present     Time: [id number] PT Time Calculation (min) (ACUTE ONLY): 17 min  Charges:  $Self Care/Home Management: 2023-06-27                    G CodesCatarina Hartshorn, Rison 04/06/2016, 11:14 AM

## 2016-04-06 NOTE — Progress Notes (Signed)
Patient discharged with wheelchair and went ver discharge instructions and S?S of infection /when to call MD.

## 2016-04-06 NOTE — Progress Notes (Signed)
Patient ID: [name], female   DOB: [birth year], 82 y.o.   MRN: [id number] Patient's wound is extremely macerated from the wound having too much ointment. Patient's wound needs to be washed daily with soap and water dried and a very small drop of Santyl applied. The wound has improved granulation tissue there is decreased redness in her foot patient is showing improvement. Patient should be safe for discharge from orthopedic standpoint at this time dialysis of cleansing daily Santyl ointment daily nonweightbearing left foot

## 2016-04-06 NOTE — Progress Notes (Signed)
Patient ID: [name], female   DOB: 07/05/1928, 82 y.o.   MRN: [id number]  Pt seen and examined at bedside, Dr. Sharol Given cleared for discharge.  Please see discharge summary from 03/27/2016.  [name], MD  [facility] Pager [phone]  If 7PM-7AM, please contact night-coverage [email or web address] Password [email or web address]

## 2016-04-06 NOTE — Progress Notes (Signed)
Occupational Therapy Treatment Patient Details Name: [name] MRN: [id number] DOB: 05-23-1928 Today's Date: 04/06/2016    History of present illness [name] is a 82 y.o. female, With history of osteoarthritis, diabetes mellitus, hypertension, gout, GERD who came to the hospital with redness pain and swelling of left lower extremity, s/p L first ray amputation. D/C to SNF and returned home x 3 days. Readmitted with infection of wound site.   OT comments  Pt is eager to go home. Reinforced NWB on L foot as per Dr. Jess Barters note. Pt with concerns about maintaining NWB status due to her multiple orthopedic problems with her back and R knee. Pt able to perform transfers at a modified independent level with youth walker. Supervised ambulation and standing at sink for safety. Educated pt in transporting items with RW and recommended pt sit at sink to perform self care. Will defer UB exercises and tub transfer goal to [facility] if pt discharges today.  Follow Up Recommendations  Home health OT (pt continues to refuse SNF)    Equipment Recommendations   (Youth RW)    Recommendations for Other Services      Precautions / Restrictions Precautions Precautions: Fall Restrictions Weight Bearing Restrictions: Yes LLE Weight Bearing: Non weight bearing       Mobility Bed Mobility Overal bed mobility: Modified Independent                Transfers   Equipment used: Rolling walker (2 wheeled)   Sit to Stand: Modified independent (Device/Increase time)         General transfer comment: good adherence to NWB status    Balance     Sitting balance-Leahy Scale: Good       Standing balance-Leahy Scale: Poor                     ADL Overall ADL's : Needs assistance/impaired     Grooming: Supervision/safety;Standing;Wash/dry hands;Wash/dry face Grooming Details (indicate cue type and reason): cues for NWB at sink, recommended pt sit at sink for grooming and  bathing     Lower Body Bathing: Modified independent;Sit to/from stand       Lower Body Dressing: Modified independent;Sit to/from stand   Toilet Transfer: Supervision/safety;Ambulation;RW;Comfort height toilet   Toileting- Clothing Manipulation and Hygiene: Modified independent;Sit to/from stand       Functional mobility during ADLs: Supervision/safety;Rolling walker (youth size)        Vision                     Perception     Praxis      Cognition   Behavior During Therapy: WFL for tasks assessed/performed Overall Cognitive Status: Within Functional Limits for tasks assessed                       Extremity/Trunk Assessment               Exercises     Shoulder Instructions       General Comments      Pertinent Vitals/ Pain       Pain Assessment: Faces Faces Pain Scale: No hurt  Home Living                                          Prior Functioning/Environment  Frequency Min 2X/week     Progress Toward Goals  OT Goals(current goals can now be found in the care plan section)  Progress towards OT goals: Progressing toward goals  Acute Rehab OT Goals Patient Stated Goal: return home Time For Goal Achievement: 04/10/16 Potential to Achieve Goals: Good  Plan Discharge plan remains appropriate    Co-evaluation                 End of Session Equipment Utilized During Treatment: Gait belt;Rolling walker   Activity Tolerance Patient tolerated treatment well   Patient Left with call bell/phone within reach;in chair;with family/visitor present   Nurse Communication          Time: 0826-0901 OT Time Calculation (min): 35 min  Charges: OT General Charges $OT Visit: 1 Procedure OT Treatments $Self Care/Home Management : 23-37 mins  [name] 04/06/2016, 9:46 AM  [phone]

## 2016-04-07 LAB — CULTURE, BLOOD (ROUTINE X 2)
CULTURE: NO GROWTH
Culture: NO GROWTH

## 2016-04-20 ENCOUNTER — Ambulatory Visit: Payer: Medicare Other | Admitting: Podiatry

## 2016-04-27 ENCOUNTER — Ambulatory Visit: Payer: Medicare Other | Admitting: Podiatry

## 2016-05-04 ENCOUNTER — Ambulatory Visit: Payer: Medicare Other | Admitting: Podiatry

## 2016-05-26 ENCOUNTER — Telehealth: Payer: Self-pay | Admitting: *Deleted

## 2016-05-26 NOTE — Telephone Encounter (Signed)
Pt's str, [name] states pt had a toe amputated by Dr. Sharol Given 03/04/2016, and he had mentioned a artificial toe.  I told [name] that once she had been released by Dr. Sharol Given to be evaluated for the toe filler she could have pt make an appt with our doctors.  [name] states understanding.

## 2016-06-13 ENCOUNTER — Other Ambulatory Visit: Payer: Self-pay

## 2016-07-24 ENCOUNTER — Ambulatory Visit (INDEPENDENT_AMBULATORY_CARE_PROVIDER_SITE_OTHER): Payer: Medicare Other | Admitting: Ophthalmology

## 2016-07-24 DIAGNOSIS — H43813 Vitreous degeneration, bilateral: Secondary | ICD-10-CM | POA: Diagnosis not present

## 2016-07-24 DIAGNOSIS — H348132 Central retinal vein occlusion, bilateral, stable: Secondary | ICD-10-CM | POA: Diagnosis not present

## 2016-07-24 DIAGNOSIS — I10 Essential (primary) hypertension: Secondary | ICD-10-CM | POA: Diagnosis not present

## 2016-07-24 DIAGNOSIS — H35033 Hypertensive retinopathy, bilateral: Secondary | ICD-10-CM

## 2016-07-24 DIAGNOSIS — H353134 Nonexudative age-related macular degeneration, bilateral, advanced atrophic with subfoveal involvement: Secondary | ICD-10-CM

## 2016-07-27 ENCOUNTER — Ambulatory Visit (INDEPENDENT_AMBULATORY_CARE_PROVIDER_SITE_OTHER): Payer: Medicare Other | Admitting: Ophthalmology

## 2016-11-13 ENCOUNTER — Encounter (HOSPITAL_BASED_OUTPATIENT_CLINIC_OR_DEPARTMENT_OTHER): Payer: Self-pay | Admitting: Emergency Medicine

## 2016-11-13 ENCOUNTER — Emergency Department (HOSPITAL_BASED_OUTPATIENT_CLINIC_OR_DEPARTMENT_OTHER)
Admission: EM | Admit: 2016-11-13 | Discharge: 2016-11-13 | Disposition: A | Discharge status: Home / Self Care | Payer: Medicare Other | Attending: Emergency Medicine | Admitting: Emergency Medicine

## 2016-11-13 DIAGNOSIS — R51 Headache: Secondary | ICD-10-CM | POA: Diagnosis present

## 2016-11-13 DIAGNOSIS — J0191 Acute recurrent sinusitis, unspecified: Secondary | ICD-10-CM | POA: Diagnosis not present

## 2016-11-13 DIAGNOSIS — Z79899 Other long term (current) drug therapy: Secondary | ICD-10-CM | POA: Diagnosis not present

## 2016-11-13 DIAGNOSIS — Z794 Long term (current) use of insulin: Secondary | ICD-10-CM | POA: Insufficient documentation

## 2016-11-13 DIAGNOSIS — R0982 Postnasal drip: Secondary | ICD-10-CM | POA: Insufficient documentation

## 2016-11-13 DIAGNOSIS — E119 Type 2 diabetes mellitus without complications: Secondary | ICD-10-CM | POA: Diagnosis not present

## 2016-11-13 DIAGNOSIS — I10 Essential (primary) hypertension: Secondary | ICD-10-CM | POA: Diagnosis not present

## 2016-11-13 DIAGNOSIS — Z7982 Long term (current) use of aspirin: Secondary | ICD-10-CM | POA: Diagnosis not present

## 2016-11-13 MED ORDER — FLUTICASONE PROPIONATE 50 MCG/ACT NA SUSP: 2.0000 | Freq: Every day | NASAL | 0 refills | Status: DC

## 2016-11-13 MED ORDER — LORATADINE 10 MG PO TABS: 10.0000 mg | ORAL_TABLET | Freq: Every day | ORAL | 1 refills | Status: DC

## 2016-11-13 NOTE — ED Triage Notes (Signed)
Facial and ear pain off and on since December. Sinus drainage that she thinks may be causing some nausea. Her MD treated her symptoms with Amoxicillin a couple of weeks ago.

## 2016-11-13 NOTE — ED Provider Notes (Signed)
[facility] DEPT [facility] Provider Note   CSN: [id number] Arrival date & time: 11/13/16  1033     History   Chief Complaint Chief Complaint  Patient presents with  . Facial Pain    HPI [name] is a 83 y.o. female.  HPI Patient has had sinus congestion and pressure for 2 months. Has had a course of amoxicillin without improvement. She reports that it used to be more pressure in her face and nasal drainage. She reports now it feels like things have moved down into her throat. She reports at night she feels like she has stopped dripping in her throat and awakens to find her pillow wet. Sometimes she is getting discomfort and crackling sounds going into her ears. No fever no chills. No cough or chest congestion. Past Medical History:  Diagnosis Date  . Cancer (HCC)    melanoma- R arm , area excised   . Degenerative joint disease   . Diabetes mellitus, type 2 ([place])   . Fundic gland polyps of stomach, benign   . GERD (gastroesophageal reflux disease)   . Gout   . Helicobacter pylori gastritis   . Hiatal hernia   . Hypertension   . Lumbar spinal stenosis   . Osteoarthritis   . Shingles    current shingles- x4 yrs., on L side of face     . Tubular adenoma of colon 07/2011    Patient Active Problem List   Diagnosis Date Noted  . Diabetic foot infection ([place]) 04/02/2016  . Cellulitis of left foot 04/02/2016  . Hypokalemia 04/02/2016  . Wound infection   . Foot abscess, left 03/04/2016  . Cellulitis of left lower extremity 03/01/2016  . Cellulitis 03/01/2016  . Altered mental status 08/20/2014  . Slurred speech 08/20/2014  . PE (pulmonary embolism) 08/25/2011  . Spinal stenosis of lumbar region at multiple levels 08/23/2011  . Diabetes mellitus ([place]) 08/23/2011  . Shingles   . Esophageal reflux 07/25/2011  . Unspecified constipation 07/25/2011    Past Surgical History:  Procedure Laterality Date  . AMPUTATION Left 03/04/2016   Procedure: FOOT FIRST RAY  AMPUTATION;  Surgeon: Newt Minion, MD;  Location: [facility];  Service: Orthopedics;  Laterality: Left;  . benign br. biopsy- many yrs. ago    . CARPAL TUNNEL RELEASE     bilateral  . CATARACT EXTRACTION     bilateral, /w IOL  . CHOLECYSTECTOMY  1998  . HEMORRHOID SURGERY     x 2  . LUMBAR LAMINECTOMY     6 prior back surgery   . RETINAL LASER PROCEDURE     bilateral  . ROTATOR CUFF REPAIR     bilateral  . SPINAL FUSION  08/22/2011   Procedure: FUSION POSTERIOR SPINAL MULTILEVEL/SCOLIOSIS;  Surgeon: Gunnar Bulla;  Location: [facility];  Service: Orthopedics;  Laterality: N/A;  LUMBAR LAMINECTOMY, EXTENSION OF POSTERIOR LUMBAR FUSION TO T10 WITH K2M RODS, SCREWS, CONNECTORS, HOOKS, ILIAC CREST BONE GRAFT  . TONSILLECTOMY    . TOTAL KNEE ARTHROPLASTY     left  . VAGINAL HYSTERECTOMY  1990's    OB History    No data available       Home Medications    Prior to Admission medications   Medication Sig Start Date End Date Taking? Authorizing Provider  acarbose (PRECOSE) 25 MG tablet Take 25 mg by mouth daily. 03/31/16   Historical Provider, MD  allopurinol (ZYLOPRIM) 100 MG tablet Take 1 tablet (100 mg total) by mouth daily. 03/07/16  Oswald Hillock, MD  amLODipine (NORVASC) 5 MG tablet Take 5 mg by mouth daily.      Historical Provider, MD  aspirin EC 81 MG tablet Take 81 mg by mouth 2 (two) times daily.     Historical Provider, MD  calcium carbonate (OS-CAL) 600 MG TABS tablet Take 600 mg by mouth daily with breakfast.    Historical Provider, MD  colchicine 0.6 MG tablet Take 1 tablet (0.6 mg total) by mouth 2 (two) times daily as needed (gout). 03/07/16   Oswald Hillock, MD  collagenase (SANTYL) ointment Apply topically daily. Apply to the affected area after wound cleaned with soap and water 04/05/16   Theodis Blaze, MD  doxycycline (VIBRA-TABS) 100 MG tablet Take 1 tablet (100 mg total) by mouth 2 (two) times daily. 04/05/16   Theodis Blaze, MD  fluticasone (FLONASE) 50 MCG/ACT nasal spray  Place 2 sprays into both nostrils daily. 11/13/16   Charlesetta Shanks, MD  glimepiride (AMARYL) 4 MG tablet Take 4 mg by mouth 2 (two) times daily.    Historical Provider, MD  HYDROcodone-acetaminophen (NORCO/VICODIN) 5-325 MG tablet Take 1 tablet by mouth every 6 (six) hours as needed. 04/05/16   Theodis Blaze, MD  insulin aspart (NOVOLOG) 100 UNIT/ML injection Inject 0-9 Units into the skin 3 (three) times daily with meals. 03/07/16   Oswald Hillock, MD  loratadine (CLARITIN) 10 MG tablet Take 1 tablet (10 mg total) by mouth daily. One po daily x 5 days 11/13/16   Charlesetta Shanks, MD  metFORMIN (GLUCOPHAGE) 1000 MG tablet Take 1,000 mg by mouth 2 (two) times daily. 02/21/16   Historical Provider, MD  methocarbamol (ROBAXIN) 500 MG tablet Take 1 tablet (500 mg total) by mouth every 6 (six) hours as needed for muscle spasms. 03/07/16   Oswald Hillock, MD  omeprazole (PRILOSEC) 20 MG capsule Take 20 mg by mouth daily.    Historical Provider, MD  Polyethyl Glycol-Propyl Glycol (SYSTANE OP) Apply 1 drop to eye daily as needed. For dry eyes    Historical Provider, MD  ramipril (ALTACE) 10 MG capsule Take 10 mg by mouth daily.      Historical Provider, MD    Family History Family History  Problem Relation Age of Onset  . Diabetes Mother   . Cancer Mother     mother  . Diabetes Paternal Aunt     x 1  . Diabetes Paternal Uncle     x 3  . Diabetes Brother   . Diabetes Other     neice  . Heart disease Father   . Cancer Father     gallbladder  . Liver cancer Father   . Heart disease Brother   . Colon polyps Sister   . Colon cancer Neg Hx   . Anesthesia problems Neg Hx   . Hypotension Neg Hx   . Malignant hyperthermia Neg Hx   . Pseudochol deficiency Neg Hx     Social History Social History  Substance Use Topics  . Smoking status: Never Smoker  . Smokeless tobacco: Never Used  . Alcohol use No     Allergies   Celebrex [celecoxib]; Morphine sulfate; and Percocet  [oxycodone-acetaminophen]   Review of Systems Review of Systems 10 Systems reviewed and are negative for acute change except as noted in the HPI.  Physical Exam Updated Vital Signs BP 189/87   Pulse 112   Temp 98.4 F (36.9 C) (Oral)   Resp 18   Ht  5\' 2"  (1.575 m)   Wt 155 lb (70.3 kg)   SpO2 95%   BMI 28.35 kg/m   Physical Exam  Constitutional: She is oriented to person, place, and time. She appears well-developed and well-nourished. No distress.  HENT:  Head: Normocephalic and atraumatic.  Right Ear: External ear normal.  Left Ear: External ear normal.  Nose: Nose normal.  Mouth/Throat: Oropharynx is clear and moist.  Bilateral TMs normal without erythema or bulging. Normal pinna without lesions. No percussion tenderness over mastoids. No facial percussion tenderness. Nares are both patent without significant nasal bogginess and no discharge. Posterior oropharynx is widely patent without visible postnasal drip.  Eyes: Conjunctivae and EOM are normal. Pupils are equal, round, and reactive to light.  Neck: Neck supple.  Cardiovascular: Normal rate, regular rhythm, normal heart sounds and intact distal pulses.   Pulmonary/Chest: Effort normal and breath sounds normal.  Musculoskeletal: Normal range of motion.  Lymphadenopathy:    She has no cervical adenopathy.  Neurological: She is alert and oriented to person, place, and time. She exhibits normal muscle tone. Coordination normal.  Skin: Skin is warm and dry.  Psychiatric: She has a normal mood and affect.     ED Treatments / Results  Labs (all labs ordered are listed, but only abnormal results are displayed) Labs Reviewed - No data to display  EKG  EKG Interpretation None       Radiology No results found.  Procedures Procedures (including critical care time)  Medications Ordered in ED Medications - No data to display   Initial Impression / Assessment and Plan / ED Course  I have reviewed the triage  vital signs and the nursing notes.  Pertinent labs & imaging results that were available during my care of the patient were reviewed by me and considered in my medical decision making (see chart for details).       Final Clinical Impressions(s) / ED Diagnoses   Final diagnoses:  Acute recurrent sinusitis, unspecified location  Post-nasal drip  Patient seems frustrated with the length of time her symptoms have persisted. She describes sinusitis type symptoms with now postnasal drip. Her exam is normal without facial swelling. Patient has not had fever. She has been treated with a course of antibiotics without subjective improvement. At this time, I feel that empiric antibiotics are likely to be less beneficial to the patient and ongoing symptomatic treatment. Patient will be prescribed Flonase and Claritin. Patient advised to follow up with ENT for recheck.  New Prescriptions New Prescriptions   FLUTICASONE (FLONASE) 50 MCG/ACT NASAL SPRAY    Place 2 sprays into both nostrils daily.   LORATADINE (CLARITIN) 10 MG TABLET    Take 1 tablet (10 mg total) by mouth daily. One po daily x 5 days     Charlesetta Shanks, MD 11/13/16 1227

## 2016-11-14 ENCOUNTER — Other Ambulatory Visit: Payer: Self-pay | Admitting: Internal Medicine

## 2016-11-14 DIAGNOSIS — Z1231 Encounter for screening mammogram for malignant neoplasm of breast: Secondary | ICD-10-CM

## 2016-12-20 ENCOUNTER — Encounter: Payer: Self-pay | Admitting: Gastroenterology

## 2016-12-20 ENCOUNTER — Ambulatory Visit (INDEPENDENT_AMBULATORY_CARE_PROVIDER_SITE_OTHER): Payer: Medicare Other | Admitting: Gastroenterology

## 2016-12-20 ENCOUNTER — Ambulatory Visit: Payer: Medicare Other | Admitting: Gastroenterology

## 2016-12-20 VITALS — BP 142/70 | HR 88 | Ht 62.0 in | Wt 154.0 lb

## 2016-12-20 DIAGNOSIS — R197 Diarrhea, unspecified: Secondary | ICD-10-CM | POA: Diagnosis not present

## 2016-12-20 DIAGNOSIS — R159 Full incontinence of feces: Secondary | ICD-10-CM | POA: Diagnosis not present

## 2016-12-20 DIAGNOSIS — K580 Irritable bowel syndrome with diarrhea: Secondary | ICD-10-CM | POA: Diagnosis not present

## 2016-12-20 MED ORDER — GLYCOPYRROLATE 1 MG PO TABS: 1.0000 mg | ORAL_TABLET | Freq: Two times a day (BID) | ORAL | 11 refills | Status: DC

## 2016-12-20 NOTE — Patient Instructions (Signed)
We have sent the following medications to your pharmacy for you to pick up at your convenience:Robinul.  Take this every twice daily every day.  You can take over the counter Imodium twice daily as needed for diarrhea.   Use baby wipes after every bowel movement to clean yourself.   Continue your probiotic for another month.   Thank you for choosing me and [facility].  Pricilla Riffle. Dagoberto Ligas., MD., [facility]

## 2016-12-20 NOTE — Progress Notes (Signed)
History of Present Illness: This is an 83 year old female referred by Jani Gravel, MD for the evaluation of diarrhea, abdominal pain and occasional fecal incontinence. Symptoms substantially worsened with recent course of antibiotics and another medication that she doesn't recall. Last visit in 05/2012 for IBS. Long history of IBS and intermittent fecal incontinence. Over several days she notes a gradual improvement and she is nearing her prior bowel pattern. She feels a probiotic has helped. Denies any specific foods that lead to diarrhea. Only takes a small amount of lactose and caffeine in her diet. She notes it is difficult to clean stool around her external hemorrhoids. Denies weight loss, diarrhea, change in stool caliber, melena, hematochezia, nausea, vomiting, dysphagia, reflux symptoms, chest pain.  EGD in 07/2011 showed a small HH.  Colonoscopy in 07/2011 showed small polyps, otherwise normal.   Allergies  Allergen Reactions  . Celebrex [Celecoxib] Swelling  . Morphine Sulfate Nausea Only  . Percocet [Oxycodone-Acetaminophen] Itching   Outpatient Medications Prior to Visit  Medication Sig Dispense Refill  . allopurinol (ZYLOPRIM) 100 MG tablet Take 1 tablet (100 mg total) by mouth daily. 30 tablet   . amLODipine (NORVASC) 5 MG tablet Take 5 mg by mouth daily.      [name] aspirin EC 81 MG tablet Take 81 mg by mouth 2 (two) times daily.     . calcium carbonate (OS-CAL) 600 MG TABS tablet Take 600 mg by mouth daily with breakfast.    . glimepiride (AMARYL) 4 MG tablet Take 4 mg by mouth 2 (two) times daily.    [name] HYDROcodone-acetaminophen (NORCO/VICODIN) 5-325 MG tablet Take 1 tablet by mouth every 6 (six) hours as needed. 3 tablet 0  . metFORMIN (GLUCOPHAGE) 1000 MG tablet Take 1,000 mg by mouth 2 (two) times daily.  1  . omeprazole (PRILOSEC) 20 MG capsule Take 20 mg by mouth daily.    . ramipril (ALTACE) 10 MG capsule Take 10 mg by mouth daily.      [name] acarbose (PRECOSE) 25 MG tablet  Take 25 mg by mouth daily.  5  . colchicine 0.6 MG tablet Take 1 tablet (0.6 mg total) by mouth 2 (two) times daily as needed (gout).    . collagenase (SANTYL) ointment Apply topically daily. Apply to the affected area after wound cleaned with soap and water 15 g 0  . doxycycline (VIBRA-TABS) 100 MG tablet Take 1 tablet (100 mg total) by mouth 2 (two) times daily. 20 tablet 0  . fluticasone (FLONASE) 50 MCG/ACT nasal spray Place 2 sprays into both nostrils daily. 1 g 0  . insulin aspart (NOVOLOG) 100 UNIT/ML injection Inject 0-9 Units into the skin 3 (three) times daily with meals. 10 mL 11  . loratadine (CLARITIN) 10 MG tablet Take 1 tablet (10 mg total) by mouth daily. One po daily x 5 days 14 tablet 1  . methocarbamol (ROBAXIN) 500 MG tablet Take 1 tablet (500 mg total) by mouth every 6 (six) hours as needed for muscle spasms.    Vladimir Faster Glycol-Propyl Glycol (SYSTANE OP) Apply 1 drop to eye daily as needed. For dry eyes     No facility-administered medications prior to visit.    Past Medical History:  Diagnosis Date  . Cancer (HCC)    melanoma- R arm , area excised   . Degenerative joint disease   . Diabetes mellitus, type 2 ([place])   . Fundic gland polyps of stomach, benign   . GERD (gastroesophageal reflux disease)   .  Gout   . Helicobacter pylori gastritis   . Hiatal hernia   . Hypertension   . Lumbar spinal stenosis   . Osteoarthritis   . Shingles    current shingles- x4 yrs., on L side of face     . Tubular adenoma of colon 07/2011   Past Surgical History:  Procedure Laterality Date  . AMPUTATION Left 03/04/2016   Procedure: FOOT FIRST RAY AMPUTATION;  Surgeon: Newt Minion, MD;  Location: [facility];  Service: Orthopedics;  Laterality: Left;  . benign br. biopsy- many yrs. ago    . CARPAL TUNNEL RELEASE     bilateral  . CATARACT EXTRACTION     bilateral, /w IOL  . CHOLECYSTECTOMY  1998  . HEMORRHOID SURGERY     x 2  . LUMBAR LAMINECTOMY     6 prior back surgery   .  RETINAL LASER PROCEDURE     bilateral  . ROTATOR CUFF REPAIR     bilateral  . SPINAL FUSION  08/22/2011   Procedure: FUSION POSTERIOR SPINAL MULTILEVEL/SCOLIOSIS;  Surgeon: Gunnar Bulla;  Location: [facility];  Service: Orthopedics;  Laterality: N/A;  LUMBAR LAMINECTOMY, EXTENSION OF POSTERIOR LUMBAR FUSION TO T10 WITH K2M RODS, SCREWS, CONNECTORS, HOOKS, ILIAC CREST BONE GRAFT  . TONSILLECTOMY    . TOTAL KNEE ARTHROPLASTY     left  . VAGINAL HYSTERECTOMY  1990's   Social History   Social History  . Marital status: Divorced    Spouse name: N/A  . Number of children: N/A  . Years of education: N/A   Social History Main Topics  . Smoking status: Never Smoker  . Smokeless tobacco: Never Used  . Alcohol use No  . Drug use: No  . Sexual activity: Not Asked   Other Topics Concern  . None   Social History Narrative  . None   Family History  Problem Relation Age of Onset  . Diabetes Mother   . Cancer Mother     mother  . Diabetes Paternal Aunt     x 1  . Diabetes Paternal Uncle     x 3  . Diabetes Brother   . Diabetes Other     neice  . Heart disease Father   . Cancer Father     gallbladder  . Liver cancer Father   . Heart disease Brother   . Colon polyps Sister   . Colon cancer Neg Hx   . Anesthesia problems Neg Hx   . Hypotension Neg Hx   . Malignant hyperthermia Neg Hx   . Pseudochol deficiency Neg Hx       Review of Systems: Pertinent positive and negative review of systems were noted in the above HPI section. All other review of systems were otherwise negative.   Physical Exam: General: Well developed, well nourished, no acute distress Head: Normocephalic and atraumatic Eyes:  sclerae anicteric, EOMI Ears: Normal auditory acuity Mouth: No deformity or lesions Neck: Supple, no masses or thyromegaly Lungs: Clear throughout to auscultation Heart: Regular rate and rhythm; no murmurs, rubs or bruits Abdomen: Soft, non tender and non distended. No masses,  hepatosplenomegaly or hernias noted. Normal Bowel sounds Musculoskeletal: Symmetrical with no gross deformities  Skin: No lesions on visible extremities Pulses:  Normal pulses noted Extremities: No clubbing, cyanosis, edema or deformities noted Neurological: Alert oriented x 4, grossly nonfocal Cervical Nodes:  No significant cervical adenopathy Inguinal Nodes: No significant inguinal adenopathy Psychological:  Alert and cooperative. Anxious.   Assessment and  Recommendations:  1. IBS with recent increase in diarrhea and abdominal pain likely secondary to antibiotic-induced diarrhea and possibly other medication side effects. Follow a bland, low caffeine, low-fat, no lactose, no raw fruits, no raw vegetables diet until symptoms resolve. Resume glycopyrrolate 1 mg twice daily. Imodium twice daily as needed. Current probiotic for 1 more month and then may use ongoing if she feels it is beneficial to her bowel function. Request records from Dr. Julianne Rice office. If diarrhea fails to return to baseline will plan for further evaluation with stool studies and blood work if not recently performed. Patient is advised to call within the next few weeks if her symptoms do not return to baseline. Consider metformin side effect and a trial off metformin if symptoms persist.  2. External hemorrhoids. Standard rectal care instructions and use baby wipes following bowel movements. Minimize dry tissue paper usage.  3. Intermittent fecal incontinence. These symptoms have returned to baseline as her diarrhea has improved. Kegel exercises daily and regimen in #1 to improve IBS and diarrhea.   cc: Jani Gravel, MD [address] [place], [place] [address]

## 2016-12-21 ENCOUNTER — Ambulatory Visit
Admission: RE | Admit: 2016-12-21 | Discharge: 2016-12-21 | Disposition: A | Discharge status: Home / Self Care | Payer: Medicare Other | Source: Ambulatory Visit | Attending: Internal Medicine | Admitting: Internal Medicine

## 2016-12-21 DIAGNOSIS — Z1231 Encounter for screening mammogram for malignant neoplasm of breast: Secondary | ICD-10-CM

## 2017-01-02 ENCOUNTER — Telehealth: Payer: Self-pay | Admitting: Gastroenterology

## 2017-01-02 MED ORDER — HYDROXYZINE HCL 50 MG PO TABS: 50.0000 mg | ORAL_TABLET | Freq: Three times a day (TID) | ORAL | 0 refills | Status: DC | PRN

## 2017-01-02 NOTE — Telephone Encounter (Signed)
Patient states she started the robinul and her stomach was feeling better and her bowels were becoming more regular. Patient is not sure of the time frame but after starting the robinul, patient developed a rash on her legs and arms. She looked up the side effects on her prescription information sheet and it states rash is one of the side effects. Patient states she has been taking Benadryl and soda baths to help with her itching. Informed patient to stop Robinul and continue Benadryl as needed for the itching symptoms. Patient wants to know if she can have something else for itching and also another medication to help with her stomach. Please advise Dr. Fuller Plan.

## 2017-01-02 NOTE — Telephone Encounter (Signed)
Prescription sent to patient's pharmacy and patient notified to take OTC IBGuard 1-2 capsules by mouth three times a day. Patient verbalized understanding.

## 2017-01-02 NOTE — Telephone Encounter (Signed)
Atarax 50 mg po tid prn, for itching, #30, no refills IBgard 1-2 tid

## 2017-01-17 ENCOUNTER — Telehealth: Payer: Self-pay | Admitting: Gastroenterology

## 2017-01-17 NOTE — Telephone Encounter (Signed)
Patient reports that she stopped her antidiarrheal meds.  She is advised that she should resume her IBgard and imodium. She will call back if she has any additional questions or concerns.

## 2017-07-26 ENCOUNTER — Ambulatory Visit (INDEPENDENT_AMBULATORY_CARE_PROVIDER_SITE_OTHER): Payer: Medicare Other | Admitting: Ophthalmology

## 2017-08-14 ENCOUNTER — Emergency Department (HOSPITAL_BASED_OUTPATIENT_CLINIC_OR_DEPARTMENT_OTHER)
Admission: EM | Admit: 2017-08-14 | Discharge: 2017-08-14 | Disposition: A | Discharge status: Home / Self Care | Payer: Medicare Other | Attending: Emergency Medicine | Admitting: Emergency Medicine

## 2017-08-14 ENCOUNTER — Encounter (HOSPITAL_BASED_OUTPATIENT_CLINIC_OR_DEPARTMENT_OTHER): Payer: Self-pay

## 2017-08-14 ENCOUNTER — Other Ambulatory Visit: Payer: Self-pay

## 2017-08-14 ENCOUNTER — Emergency Department (HOSPITAL_BASED_OUTPATIENT_CLINIC_OR_DEPARTMENT_OTHER): Payer: Medicare Other

## 2017-08-14 DIAGNOSIS — Z79899 Other long term (current) drug therapy: Secondary | ICD-10-CM | POA: Diagnosis not present

## 2017-08-14 DIAGNOSIS — R0989 Other specified symptoms and signs involving the circulatory and respiratory systems: Secondary | ICD-10-CM | POA: Insufficient documentation

## 2017-08-14 DIAGNOSIS — Z7982 Long term (current) use of aspirin: Secondary | ICD-10-CM | POA: Insufficient documentation

## 2017-08-14 DIAGNOSIS — J208 Acute bronchitis due to other specified organisms: Secondary | ICD-10-CM | POA: Insufficient documentation

## 2017-08-14 DIAGNOSIS — I10 Essential (primary) hypertension: Secondary | ICD-10-CM | POA: Diagnosis not present

## 2017-08-14 DIAGNOSIS — Z85828 Personal history of other malignant neoplasm of skin: Secondary | ICD-10-CM | POA: Diagnosis not present

## 2017-08-14 DIAGNOSIS — Z96652 Presence of left artificial knee joint: Secondary | ICD-10-CM | POA: Diagnosis not present

## 2017-08-14 DIAGNOSIS — E860 Dehydration: Secondary | ICD-10-CM | POA: Diagnosis not present

## 2017-08-14 DIAGNOSIS — E119 Type 2 diabetes mellitus without complications: Secondary | ICD-10-CM | POA: Insufficient documentation

## 2017-08-14 DIAGNOSIS — R070 Pain in throat: Secondary | ICD-10-CM | POA: Diagnosis not present

## 2017-08-14 DIAGNOSIS — R109 Unspecified abdominal pain: Secondary | ICD-10-CM | POA: Diagnosis not present

## 2017-08-14 DIAGNOSIS — Z7984 Long term (current) use of oral hypoglycemic drugs: Secondary | ICD-10-CM | POA: Diagnosis not present

## 2017-08-14 DIAGNOSIS — R05 Cough: Secondary | ICD-10-CM | POA: Diagnosis present

## 2017-08-14 LAB — CBC WITH DIFFERENTIAL/PLATELET
Basophils Absolute: 0 10*3/uL (ref 0.0–0.1)
Basophils Relative: 0 %
EOS ABS: 0.3 10*3/uL (ref 0.0–0.7)
Eosinophils Relative: 4 %
HEMATOCRIT: 35.8 % — AB (ref 36.0–46.0)
HEMOGLOBIN: 11.8 g/dL — AB (ref 12.0–15.0)
LYMPHS ABS: 2.3 10*3/uL (ref 0.7–4.0)
LYMPHS PCT: 28 %
MCH: 28.9 pg (ref 26.0–34.0)
MCHC: 33 g/dL (ref 30.0–36.0)
MCV: 87.5 fL (ref 78.0–100.0)
MONOS PCT: 8 %
Monocytes Absolute: 0.6 10*3/uL (ref 0.1–1.0)
NEUTROS PCT: 60 %
Neutro Abs: 4.9 10*3/uL (ref 1.7–7.7)
Platelets: 245 10*3/uL (ref 150–400)
RBC: 4.09 MIL/uL (ref 3.87–5.11)
RDW: 14.5 % (ref 11.5–15.5)
WBC: 8.2 10*3/uL (ref 4.0–10.5)

## 2017-08-14 LAB — COMPREHENSIVE METABOLIC PANEL
ALK PHOS: 65 U/L (ref 38–126)
ALT: 29 U/L (ref 14–54)
ANION GAP: 10 (ref 5–15)
AST: 43 U/L — ABNORMAL HIGH (ref 15–41)
Albumin: 4 g/dL (ref 3.5–5.0)
BILIRUBIN TOTAL: 0.5 mg/dL (ref 0.3–1.2)
BUN: 31 mg/dL — ABNORMAL HIGH (ref 6–20)
CALCIUM: 9.5 mg/dL (ref 8.9–10.3)
CO2: 21 mmol/L — AB (ref 22–32)
CREATININE: 1.29 mg/dL — AB (ref 0.44–1.00)
Chloride: 104 mmol/L (ref 101–111)
GFR, EST AFRICAN AMERICAN: 42 mL/min — AB (ref >60)
GFR, EST NON AFRICAN AMERICAN: 36 mL/min — AB (ref >60)
Glucose, Bld: 198 mg/dL — ABNORMAL HIGH (ref 65–99)
Potassium: 4.4 mmol/L (ref 3.5–5.1)
SODIUM: 135 mmol/L (ref 135–145)
TOTAL PROTEIN: 7.2 g/dL (ref 6.5–8.1)

## 2017-08-14 MED ORDER — ALBUTEROL SULFATE HFA 108 (90 BASE) MCG/ACT IN AERS
2.0000 | INHALATION_SPRAY | Freq: Once | RESPIRATORY_TRACT | Status: AC
  Administered 2017-08-14: 2 via RESPIRATORY_TRACT
  Filled 2017-08-14: qty 6.7

## 2017-08-14 MED ORDER — DEXAMETHASONE SODIUM PHOSPHATE 10 MG/ML IJ SOLN
10.0000 mg | Freq: Once | INTRAMUSCULAR | Status: AC
  Administered 2017-08-14: 10 mg via INTRAVENOUS
  Filled 2017-08-14: qty 1

## 2017-08-14 MED ORDER — SODIUM CHLORIDE 0.9 % IV BOLUS (SEPSIS)
1000.0000 mL | Freq: Once | INTRAVENOUS | Status: AC
  Administered 2017-08-14: 1000 mL via INTRAVENOUS

## 2017-08-14 NOTE — ED Triage Notes (Signed)
C/o flu like sx x 6 days-NAD-steady gait

## 2017-08-14 NOTE — ED Provider Notes (Signed)
[facility] EMERGENCY DEPARTMENT Provider Note   CSN: [id number] Arrival date & time: 08/14/17  1343     History   Chief Complaint Chief Complaint  Patient presents with  . Cough    HPI [name] is a 83 y.o. female.  HPI  Since last Wednesday Laryngitis started Thursday, sore throat then Friday started coughing Feeling tired, generalized weakness sort of beginning today.  No shortness of breath.  No chest pain.  Sore from coughing.  No other body aches.  Has been laying around, not able to do much.  Has had runny nose, is more clear right now.  When cough try to cough up stuff, is mainly clear, once in a while sees yellow color.  Cough has been decreasing.  Hurts stomach to cough.  Soreness from coughing in abdomen, doesn't otherwise have pain.   Past Medical History:  Diagnosis Date  . Cancer (HCC)    melanoma- R arm , area excised   . Degenerative joint disease   . Diabetes mellitus, type 2 ([place])   . Fundic gland polyps of stomach, benign   . GERD (gastroesophageal reflux disease)   . Gout   . Helicobacter pylori gastritis   . Hiatal hernia   . Hypertension   . Lumbar spinal stenosis   . Osteoarthritis   . Shingles    current shingles- x4 yrs., on L side of face     . Tubular adenoma of colon 07/2011    Patient Active Problem List   Diagnosis Date Noted  . Diabetic foot infection ([place]) 04/02/2016  . Cellulitis of left foot 04/02/2016  . Hypokalemia 04/02/2016  . Wound infection   . Foot abscess, left 03/04/2016  . Cellulitis of left lower extremity 03/01/2016  . Cellulitis 03/01/2016  . Altered mental status 08/20/2014  . Slurred speech 08/20/2014  . PE (pulmonary embolism) 08/25/2011  . Spinal stenosis of lumbar region at multiple levels 08/23/2011  . Diabetes mellitus ([place]) 08/23/2011  . Shingles   . Esophageal reflux 07/25/2011  . Unspecified constipation 07/25/2011    Past Surgical History:  Procedure Laterality Date  .  AMPUTATION Left 03/04/2016   Procedure: FOOT FIRST RAY AMPUTATION;  Surgeon: Newt Minion, MD;  Location: [facility];  Service: Orthopedics;  Laterality: Left;  . benign br. biopsy- many yrs. ago    . BREAST EXCISIONAL BIOPSY Right   . CARPAL TUNNEL RELEASE     bilateral  . CATARACT EXTRACTION     bilateral, /w IOL  . CHOLECYSTECTOMY  1998  . HEMORRHOID SURGERY     x 2  . LUMBAR LAMINECTOMY     6 prior back surgery   . RETINAL LASER PROCEDURE     bilateral  . ROTATOR CUFF REPAIR     bilateral  . SPINAL FUSION  08/22/2011   Procedure: FUSION POSTERIOR SPINAL MULTILEVEL/SCOLIOSIS;  Surgeon: Gunnar Bulla;  Location: [facility];  Service: Orthopedics;  Laterality: N/A;  LUMBAR LAMINECTOMY, EXTENSION OF POSTERIOR LUMBAR FUSION TO T10 WITH K2M RODS, SCREWS, CONNECTORS, HOOKS, ILIAC CREST BONE GRAFT  . TONSILLECTOMY    . TOTAL KNEE ARTHROPLASTY     left  . VAGINAL HYSTERECTOMY  1990's    OB History    No data available       Home Medications    Prior to Admission medications   Medication Sig Start Date End Date Taking? Authorizing Provider  allopurinol (ZYLOPRIM) 100 MG tablet Take 1 tablet (100 mg total) by mouth daily.  03/07/16   Oswald Hillock, MD  amLODipine (NORVASC) 5 MG tablet Take 5 mg by mouth daily.      [provider]  aspirin EC 81 MG tablet Take 81 mg by mouth 2 (two) times daily.     [provider]  calcium carbonate (OS-CAL) 600 MG TABS tablet Take 600 mg by mouth daily with breakfast.    [provider]  glimepiride (AMARYL) 4 MG tablet Take 4 mg by mouth 2 (two) times daily.    [provider]  HYDROcodone-acetaminophen (NORCO/VICODIN) 5-325 MG tablet Take 1 tablet by mouth every 6 (six) hours as needed. 04/05/16   Theodis Blaze, MD  hydrOXYzine (ATARAX/VISTARIL) 50 MG tablet Take 1 tablet (50 mg total) by mouth 3 (three) times daily as needed. 01/02/17   Ladene Artist, MD  LORazepam (ATIVAN) 0.5 MG tablet Take 1 tablet by mouth as  needed. 10/10/16   [provider]  metFORMIN (GLUCOPHAGE) 1000 MG tablet Take 1,000 mg by mouth 2 (two) times daily. 02/21/16   [provider]  omeprazole (PRILOSEC) 20 MG capsule Take 20 mg by mouth daily.    [provider]  ramipril (ALTACE) 10 MG capsule Take 10 mg by mouth daily.      [provider]    Family History Family History  Problem Relation Age of Onset  . Diabetes Mother   . Cancer Mother        mother  . Diabetes Paternal Aunt        x 1  . Diabetes Paternal Uncle        x 3  . Diabetes Brother   . Diabetes Other        neice  . Heart disease Father   . Cancer Father        gallbladder  . Liver cancer Father   . Heart disease Brother   . Colon polyps Sister   . Colon cancer Neg Hx   . Anesthesia problems Neg Hx   . Hypotension Neg Hx   . Malignant hyperthermia Neg Hx   . Pseudochol deficiency Neg Hx     Social History Social History  Substance Use Topics  . Smoking status: Never Smoker  . Smokeless tobacco: Never Used  . Alcohol use No     Allergies   Celebrex [celecoxib]; Morphine sulfate; Percocet [oxycodone-acetaminophen]; and Robinul [glycopyrrolate]   Review of Systems Review of Systems  Constitutional: Positive for appetite change and fatigue. Negative for fever (not sure).  HENT: Positive for congestion and sore throat.   Respiratory: Positive for cough. Negative for shortness of breath.   Cardiovascular: Negative for chest pain.  Gastrointestinal: Positive for constipation and nausea. Negative for abdominal pain, diarrhea and vomiting.  Genitourinary: Negative for dysuria.  Neurological: Negative for headaches.     Physical Exam Updated Vital Signs BP 137/63   Pulse 80   Temp 98.6 F (37 C) (Oral)   Resp 16   Ht 5\' 2"  (1.575 m)   Wt 68.2 kg (150 lb 5.7 oz)   SpO2 95%   BMI 27.50 kg/m   Physical Exam  Constitutional: She is oriented to person, place, and time. She appears well-developed  and well-nourished. No distress.  HENT:  Head: Normocephalic and atraumatic.  Eyes: Conjunctivae and EOM are normal.  Neck: Normal range of motion.  Cardiovascular: Normal rate, regular rhythm, normal heart sounds and intact distal pulses.  Exam reveals no gallop and no friction rub.  No murmur heard. Pulmonary/Chest: Effort normal. No respiratory distress. She has wheezes. She has no rales.  Abdominal: Soft. She exhibits no distension. There is no tenderness. There is no guarding.  Musculoskeletal: She exhibits no edema or tenderness.  Neurological: She is alert and oriented to person, place, and time.  Skin: Skin is warm and dry. No rash noted. She is not diaphoretic. No erythema.  Nursing note and vitals reviewed.    ED Treatments / Results  Labs (all labs ordered are listed, but only abnormal results are displayed) Labs Reviewed  CBC WITH DIFFERENTIAL/PLATELET - Abnormal; Notable for the following:       Result Value   Hemoglobin 11.8 (*)    HCT 35.8 (*)    All other components within normal limits  COMPREHENSIVE METABOLIC PANEL - Abnormal; Notable for the following:    CO2 21 (*)    Glucose, Bld 198 (*)    BUN 31 (*)    Creatinine, Ser 1.29 (*)    AST 43 (*)    GFR calc non Af Amer 36 (*)    GFR calc Af Amer 42 (*)    All other components within normal limits    EKG  EKG Interpretation None       Radiology Dg Chest 2 View  Result Date: 08/14/2017 CLINICAL DATA:  Five days of productive cough and chest congestion. Nonsmoker. History of diabetes and hypertension EXAM: CHEST  2 VIEW COMPARISON:  Chest x-ray of May 29, 2016 FINDINGS: The left lung is adequately inflated and clear. On the right there is elevation of the hemidiaphragm which is chronic. There is no alveolar infiltrate or pleural effusion. The heart and pulmonary vascularity are normal. There is calcification in the wall of the thoracic aorta. There are Harrington rods present in the lower thoracic and  lumbar spines. IMPRESSION: There is no pneumonia, CHF, nor other acute cardiopulmonary abnormality. Chronic elevation of the right hemidiaphragm. Thoracic aortic atherosclerosis. Electronically Signed   By: David  Martinique M.D.   On: 08/14/2017 14:11    Procedures Procedures (including critical care time)  Medications Ordered in ED Medications  albuterol (PROVENTIL HFA;VENTOLIN HFA) 108 (90 Base) MCG/ACT inhaler 2 puff (2 puffs Inhalation Given 08/14/17 1503)  sodium chloride 0.9 % bolus 1,000 mL (0 mLs Intravenous Stopped 08/14/17 1617)  dexamethasone (DECADRON) injection 10 mg (10 mg Intravenous Given 08/14/17 1504)     Initial Impression / Assessment and Plan / ED Course  I have reviewed the triage vital signs and the nursing notes.  Pertinent labs & imaging results that were available during my care of the patient were reviewed by me and considered in my medical decision making (see chart for details).    83 year old female with history above presents with concern for cough for 6 days and generalized weakness.  No significant electrolyte abnormalities, hemoglobin within normal limits.  Chest x-ray shows no sign of pneumonia.  Patient does have wheezing on exam, consistent with likely viral bronchitis.  She was given a dose of Decadron in the emergency department and albuterol as well as 1 L of normal saline with improvement.  Her creatinine is elevated from last value 1 year ago, unclear the acuity of this, but suspect some dehydration related to viral illness and bronchitis.  Recommend continued supportive care, hydration, albuterol as needed and close follow-up with primary care physician for recheck of creatinine.  Final Clinical Impressions(s) / ED Diagnoses   Final diagnoses:  Viral bronchitis  Dehydration  New Prescriptions Discharge Medication List as of 08/14/2017  4:14 PM       Gareth Morgan, MD 08/15/17 [phone]

## 2017-08-14 NOTE — Discharge Instructions (Signed)
You may use your inhaler every 4 hours as needed for cough or wheezing.

## 2017-08-16 ENCOUNTER — Telehealth (HOSPITAL_BASED_OUTPATIENT_CLINIC_OR_DEPARTMENT_OTHER): Payer: Self-pay | Admitting: Emergency Medicine

## 2017-08-16 NOTE — Telephone Encounter (Signed)
Patient's daughter called and wanted more information for the patient. Reports that she continues to get worse and her Blood sugars are high and then low. The daughter is not currently with the patient. Daughter advised that information or advise can not be given over the phone and either attempt at home treatment or if they feel there is worsening of condition to come back to be reevaluated.

## 2018-07-02 ENCOUNTER — Telehealth (INDEPENDENT_AMBULATORY_CARE_PROVIDER_SITE_OTHER): Payer: Self-pay

## 2018-07-02 ENCOUNTER — Ambulatory Visit (INDEPENDENT_AMBULATORY_CARE_PROVIDER_SITE_OTHER): Payer: Medicare Other | Admitting: Orthopaedic Surgery

## 2018-07-02 ENCOUNTER — Encounter (INDEPENDENT_AMBULATORY_CARE_PROVIDER_SITE_OTHER): Payer: Self-pay | Admitting: Orthopaedic Surgery

## 2018-07-02 ENCOUNTER — Ambulatory Visit (INDEPENDENT_AMBULATORY_CARE_PROVIDER_SITE_OTHER): Payer: Medicare Other

## 2018-07-02 DIAGNOSIS — M1711 Unilateral primary osteoarthritis, right knee: Secondary | ICD-10-CM | POA: Insufficient documentation

## 2018-07-02 DIAGNOSIS — M5442 Lumbago with sciatica, left side: Secondary | ICD-10-CM | POA: Diagnosis not present

## 2018-07-02 DIAGNOSIS — M25561 Pain in right knee: Secondary | ICD-10-CM

## 2018-07-02 MED ORDER — DICLOFENAC SODIUM 1 % TD GEL: 2.0000 g | Freq: Four times a day (QID) | TRANSDERMAL | 3 refills | Status: DC

## 2018-07-02 MED ORDER — PREGABALIN 75 MG PO CAPS: 75.0000 mg | ORAL_CAPSULE | Freq: Two times a day (BID) | ORAL | 2 refills | Status: DC

## 2018-07-02 MED ORDER — LIDOCAINE HCL 1 % IJ SOLN
3.0000 mL | INTRAMUSCULAR | Status: AC | PRN
Start: 2018-07-02 — End: 2018-07-02
  Administered 2018-07-02: 3 mL

## 2018-07-02 MED ORDER — METHYLPREDNISOLONE ACETATE 40 MG/ML IJ SUSP
40.0000 mg | INTRAMUSCULAR | Status: AC | PRN
  Administered 2018-07-02: 40 mg via INTRA_ARTICULAR

## 2018-07-02 MED ORDER — HYDROCODONE-ACETAMINOPHEN 5-325 MG PO TABS: 1.0000 | ORAL_TABLET | Freq: Four times a day (QID) | ORAL | 0 refills | Status: DC | PRN

## 2018-07-02 NOTE — Telephone Encounter (Signed)
Submitted VOB for Monovisc, right knee. 

## 2018-07-02 NOTE — Telephone Encounter (Signed)
Noted  

## 2018-07-02 NOTE — Telephone Encounter (Signed)
Right knee gel injection  

## 2018-07-02 NOTE — Progress Notes (Signed)
Office Visit Note   Patient: [name]           Date of Birth: January 10, 1928           MRN: [id number] Visit Date: 07/02/2018              Requested by: Jani Gravel, MD [address] [place], [place] [id number] PCP: Jani Gravel, MD   Assessment & Plan: Visit Diagnoses:  1. Low back pain with left-sided sciatica, unspecified back pain laterality, unspecified chronicity   2. Right knee pain, unspecified chronicity   3. Unilateral primary osteoarthritis, right knee     Plan: 1 of her biggest complaints is certainly neuropathy as well.  She is tried gabapentin and cannot take this.  At 84 years old she wants to be on even some hydrocodone and subarachnoid it is very short-term.  We will send in some hydrocodone for her.  I did provide a steroid injection in her right knee today and she is a perfect candidate for hyaluronic acid injection in the right knee in 4 weeks.  I will see if her insurance company will cover Voltaren gel as well as Lyrica.  We will see her back in 4 weeks for the hyaluronic acid injection.  Follow-Up Instructions: Return in 4 weeks (on 07/30/2018).   Orders:  Orders Placed This Encounter  Procedures  . Large Joint Inj  . XR Lumbar Spine 2-3 Views  . XR Knee 1-2 Views Right   No orders of the defined types were placed in this encounter.     Procedures: Large Joint Inj: R knee on 07/02/2018 10:54 AM Indications: diagnostic evaluation and pain Details: 22 G 1.5 in needle, superolateral approach  Arthrogram: No  Medications: 3 mL lidocaine 1 %; 40 mg methylPREDNISolone acetate 40 MG/ML Outcome: tolerated well, no immediate complications Procedure, treatment alternatives, risks and benefits explained, specific risks discussed. Consent was given by the patient. Immediately prior to procedure a time out was called to verify the correct patient, procedure, equipment, support staff and site/side marked as required. Patient was prepped and draped in  the usual sterile fashion.       Clinical Data: No additional findings.   Subjective: Chief Complaint  Patient presents with  . Lower Back - Pain  Patient's only seen before.  Actually performed a left total knee arthroplasty on her remotely.  She is 84 years old.  She comes in with pain in her right knee that is been slowly worsening with time.  She does have neuropathy in both her feet and low back pain with radiculopathy.  She is had 7 back operations over the years.  She said recently that someone trying to perform epidural steroid injections did not even get them in her back from the scar tissue.  She understands there is nothing I can really do for her back at all and that she still needs to see her back specialist for this.  She understands that as well.  Her left total knee does hurt for her but is been well documented that the doing well.  The right knee hurting her more recent.  She denies any locking catching.  HPI  Review of Systems She currently denies any headache, chest pain, shortness of breath, fever, chills, nausea, vomiting.  Objective: Vital Signs: There were no vitals taken for this visit.  Physical Exam She is alert and oriented x3 and in no acute distress Ortho Exam Examination of her right knee shows  no effusion.  She has medial joint line tenderness and lateral tenderness with significant patellofemoral crepitation.  The knee feels ligamentously stable and has full range of motion.  Examination of her lumbar spine shows significant stiffness and pain. Specialty Comments:  No specialty comments available.  Imaging: Xr Knee 1-2 Views Right  Result Date: 07/02/2018 2 views of the right knee show tricompartmental arthritic changes that is moderate severe.  Xr Lumbar Spine 2-3 Views  Result Date: 07/02/2018 2 views of lumbar spine show multilevel lumbar fusion with hardware including rods and screws.  There are no acute changes.  There is significant  arthritic changes at the levels above and below the fusion.    PMFS History: Patient Active Problem List   Diagnosis Date Noted  . Unilateral primary osteoarthritis, right knee 07/02/2018  . Diabetic foot infection ([place]) 04/02/2016  . Cellulitis of left foot 04/02/2016  . Hypokalemia 04/02/2016  . Wound infection   . Foot abscess, left 03/04/2016  . Cellulitis of left lower extremity 03/01/2016  . Cellulitis 03/01/2016  . Altered mental status 08/20/2014  . Slurred speech 08/20/2014  . PE (pulmonary embolism) 08/25/2011  . Spinal stenosis of lumbar region at multiple levels 08/23/2011  . Diabetes mellitus ([place]) 08/23/2011  . Shingles   . Esophageal reflux 07/25/2011  . Unspecified constipation 07/25/2011   Past Medical History:  Diagnosis Date  . Cancer (HCC)    melanoma- R arm , area excised   . Degenerative joint disease   . Diabetes mellitus, type 2 ([place])   . Fundic gland polyps of stomach, benign   . GERD (gastroesophageal reflux disease)   . Gout   . Helicobacter pylori gastritis   . Hiatal hernia   . Hypertension   . Lumbar spinal stenosis   . Osteoarthritis   . Shingles    current shingles- x4 yrs., on L side of face     . Tubular adenoma of colon 07/2011    Family History  Problem Relation Age of Onset  . Diabetes Mother   . Cancer Mother        mother  . Diabetes Paternal Aunt        x 1  . Diabetes Paternal Uncle        x 3  . Diabetes Brother   . Diabetes Other        neice  . Heart disease Father   . Cancer Father        gallbladder  . Liver cancer Father   . Heart disease Brother   . Colon polyps Sister   . Colon cancer Neg Hx   . Anesthesia problems Neg Hx   . Hypotension Neg Hx   . Malignant hyperthermia Neg Hx   . Pseudochol deficiency Neg Hx     Past Surgical History:  Procedure Laterality Date  . AMPUTATION Left 03/04/2016   Procedure: FOOT FIRST RAY AMPUTATION;  Surgeon: Newt Minion, MD;  Location: [facility];  Service: Orthopedics;   Laterality: Left;  . benign br. biopsy- many yrs. ago    . BREAST EXCISIONAL BIOPSY Right   . CARPAL TUNNEL RELEASE     bilateral  . CATARACT EXTRACTION     bilateral, /w IOL  . CHOLECYSTECTOMY  1998  . HEMORRHOID SURGERY     x 2  . LUMBAR LAMINECTOMY     6 prior back surgery   . RETINAL LASER PROCEDURE     bilateral  . ROTATOR CUFF REPAIR  bilateral  . SPINAL FUSION  08/22/2011   Procedure: FUSION POSTERIOR SPINAL MULTILEVEL/SCOLIOSIS;  Surgeon: Gunnar Bulla;  Location: [facility];  Service: Orthopedics;  Laterality: N/A;  LUMBAR LAMINECTOMY, EXTENSION OF POSTERIOR LUMBAR FUSION TO T10 WITH K2M RODS, SCREWS, CONNECTORS, HOOKS, ILIAC CREST BONE GRAFT  . TONSILLECTOMY    . TOTAL KNEE ARTHROPLASTY     left  . VAGINAL HYSTERECTOMY  1990's   Social History   Occupational History  . Not on file  Tobacco Use  . Smoking status: Never Smoker  . Smokeless tobacco: Never Used  Substance and Sexual Activity  . Alcohol use: No  . Drug use: No  . Sexual activity: Not on file

## 2018-07-09 ENCOUNTER — Telehealth (INDEPENDENT_AMBULATORY_CARE_PROVIDER_SITE_OTHER): Payer: Self-pay

## 2018-07-09 NOTE — Telephone Encounter (Signed)
Patient is approved for Monovisc, right knee. [place] Patient is covered at 100% through insurance No co-pay No PA required.  Appt.scheduled 07/30/2018

## 2018-07-30 ENCOUNTER — Ambulatory Visit (INDEPENDENT_AMBULATORY_CARE_PROVIDER_SITE_OTHER): Payer: Medicare Other | Admitting: Orthopaedic Surgery

## 2018-07-30 ENCOUNTER — Encounter (INDEPENDENT_AMBULATORY_CARE_PROVIDER_SITE_OTHER): Payer: Self-pay | Admitting: Orthopaedic Surgery

## 2018-07-30 VITALS — Ht 62.0 in | Wt 150.3 lb

## 2018-07-30 DIAGNOSIS — M1711 Unilateral primary osteoarthritis, right knee: Secondary | ICD-10-CM | POA: Diagnosis not present

## 2018-07-30 MED ORDER — HYALURONAN 88 MG/4ML IX SOSY
88.0000 mg | PREFILLED_SYRINGE | INTRA_ARTICULAR | Status: AC | PRN
  Administered 2018-07-30: 88 mg via INTRA_ARTICULAR

## 2018-07-30 NOTE — Progress Notes (Signed)
   Procedure Note  Patient: [name]             Date of Birth: [birth year]           MRN: [id number]             Visit Date: 07/30/2018  Procedures: Visit Diagnoses: Unilateral primary osteoarthritis, right knee  Large Joint Inj: R knee on 07/30/2018 10:31 AM Indications: diagnostic evaluation and pain Details: 22 G 1.5 in needle, superolateral approach  Arthrogram: No  Medications: 88 mg Hyaluronan 88 MG/4ML Outcome: tolerated well, no immediate complications Procedure, treatment alternatives, risks and benefits explained, specific risks discussed. Consent was given by the patient. Immediately prior to procedure a time out was called to verify the correct patient, procedure, equipment, support staff and site/side marked as required. Patient was prepped and draped in the usual sterile fashion.    Patient is here today for scheduled hyaluronic acid injection with Monovisc in the right knee to treat the pain from osteoarthritis.  She is had a previous left total knee arthroplasty years ago.  She does not want to have this on the right side.  She is 84 years old.  On exam her right knee has global tenderness but no swelling and no effusion.  Her knee moves well from crepitation and feels stable ligamentously.  She tolerated the Monovisc injection well in her right knee.  All question concerns were answered and addressed.  Follow-up will be as needed.

## 2019-05-19 ENCOUNTER — Other Ambulatory Visit: Payer: Self-pay

## 2020-12-16 ENCOUNTER — Ambulatory Visit (INDEPENDENT_AMBULATORY_CARE_PROVIDER_SITE_OTHER): Payer: Medicare Other

## 2020-12-16 ENCOUNTER — Ambulatory Visit: Payer: Self-pay

## 2020-12-16 ENCOUNTER — Other Ambulatory Visit: Payer: Self-pay

## 2020-12-16 ENCOUNTER — Ambulatory Visit (INDEPENDENT_AMBULATORY_CARE_PROVIDER_SITE_OTHER): Payer: Medicare Other | Admitting: Orthopaedic Surgery

## 2020-12-16 DIAGNOSIS — M1711 Unilateral primary osteoarthritis, right knee: Secondary | ICD-10-CM | POA: Diagnosis not present

## 2020-12-16 DIAGNOSIS — Z96652 Presence of left artificial knee joint: Secondary | ICD-10-CM | POA: Diagnosis not present

## 2020-12-16 DIAGNOSIS — M25561 Pain in right knee: Secondary | ICD-10-CM | POA: Diagnosis not present

## 2020-12-16 DIAGNOSIS — G8929 Other chronic pain: Secondary | ICD-10-CM

## 2020-12-16 DIAGNOSIS — M25562 Pain in left knee: Secondary | ICD-10-CM

## 2020-12-16 DIAGNOSIS — M545 Low back pain, unspecified: Secondary | ICD-10-CM

## 2020-12-16 MED ORDER — METHYLPREDNISOLONE ACETATE 40 MG/ML IJ SUSP
40.0000 mg | INTRAMUSCULAR | Status: AC | PRN
  Administered 2020-12-16: 40 mg via INTRA_ARTICULAR

## 2020-12-16 MED ORDER — LIDOCAINE HCL 1 % IJ SOLN
3.0000 mL | INTRAMUSCULAR | Status: AC | PRN
  Administered 2020-12-16: 3 mL

## 2020-12-16 MED ORDER — PREGABALIN 75 MG PO CAPS: 75.0000 mg | ORAL_CAPSULE | Freq: Two times a day (BID) | ORAL | 1 refills | Status: DC

## 2020-12-16 NOTE — Progress Notes (Signed)
Office Visit Note   Patient: [name]           Date of Birth: 26-Feb-1928           MRN: [id number] Visit Date: 12/16/2020              Requested by: Jani Gravel, MD [place],  [place] [address] PCP: Jani Gravel, MD   Assessment & Plan: Visit Diagnoses:  1. Chronic pain of left knee   2. Chronic pain of right knee   3. Chronic bilateral low back pain, unspecified whether sciatica present   4. History of total left knee replacement   5. Unilateral primary osteoarthritis, right knee     Plan: From a back standpoint, she understands is not really anything that I have to offer her.  I will try some generic Lyrica 75 mg twice a day for neuropathy.  I did provide a steroid injection in her right knee joint and over the pes bursa of the right knee.  She wanted this on her left knee but that would be to be steroids today given her diabetes.  She tolerated the ones on the right knee.  I would like to see her back in 4 weeks to consider an injection at the left knee over the pes bursa.  All questions and concerns were answered and addressed.  Follow-Up Instructions: Return in about 4 weeks (around 01/13/2021).   Orders:  Orders Placed This Encounter  Procedures  . Large Joint Inj  . XR Lumbar Spine 2-3 Views  . XR Knee 1-2 Views Left  . XR Knee 1-2 Views Right   Meds ordered this encounter  Medications  . pregabalin (LYRICA) 75 MG capsule    Sig: Take 1 capsule (75 mg total) by mouth 2 (two) times daily.    Dispense:  60 capsule    Refill:  1      Procedures: Large Joint Inj: R knee on 12/16/2020 3:15 PM Indications: diagnostic evaluation and pain Details: 22 G 1.5 in needle, superolateral approach  Arthrogram: No  Medications: 3 mL lidocaine 1 %; 40 mg methylPREDNISolone acetate 40 MG/ML Outcome: tolerated well, no immediate complications Procedure, treatment alternatives, risks and benefits explained, specific risks discussed. Consent was  given by the patient. Immediately prior to procedure a time out was called to verify the correct patient, procedure, equipment, support staff and site/side marked as required. Patient was prepped and draped in the usual sterile fashion.       Clinical Data: No additional findings.   Subjective: Chief Complaint  Patient presents with  . Left Knee - Pain  . Right Knee - Pain  . Lower Back - Pain  The patient is a 87 year old female who have seen before.  We actually replaced her left knee many years ago.  She has known and will document end-stage arthritis of her right knee.  She has chronic low back pain and has had a multilevel spinal fusion by someone else.  She has tried injections in her back but they cannot even get a needle through the scar tissue she states.  She has severe peripheral neuropathy as well and just chronic pain in general.  Her right knee hurts her quite a bit.  The left knee hurts but she points to the pes bursa on the left knee area.  She is a diabetic and sounds like her blood glucose has run a little high.  HPI  Review of Systems She  currently denies any headache, chest pain, shortness of breath, fever, chills, nausea, vomiting  Objective: Vital Signs: There were no vitals taken for this visit.  Physical Exam She is alert and oriented x3 and in no acute distress Ortho Exam Examination of her right knee shows varus malalignment with significant medial joint line tenderness and tenderness over the pes bursa.  The left knee has good alignment overall with no instability on exam.  There is some patellofemoral crepitation from scar tissue.  Her pain is only over the pes bursa area.  She has significant pain in the lower lumbar spine. Specialty Comments:  No specialty comments available.  Imaging: XR Knee 1-2 Views Left  Result Date: 12/16/2020 Two views of the left knee show a total knee arthroplasty with no complicating features.  There is no evidence of  loosening.  There is no malalignment.  XR Knee 1-2 Views Right  Result Date: 12/16/2020 Two views of the right knee show severe tricompartment arthritis with varus malalignment.  There is complete loss of medial joint space compared to osteophytes in all three compartments.  There is significant patellofemoral arthritic changes.  XR Lumbar Spine 2-3 Views  Result Date: 12/16/2020 Two views of the lumbar spine show no acute findings.  There is a multilevel fusion from the lower thoracic to lower lumbar spine.  There is no evidence of hardware failure.    PMFS History: Patient Active Problem List   Diagnosis Date Noted  . History of total left knee replacement 12/16/2020  . Unilateral primary osteoarthritis, right knee 07/02/2018  . Diabetic foot infection ([facility]) 04/02/2016  . Cellulitis of left foot 04/02/2016  . Hypokalemia 04/02/2016  . Wound infection   . Foot abscess, left 03/04/2016  . Cellulitis of left lower extremity 03/01/2016  . Cellulitis 03/01/2016  . Altered mental status 08/20/2014  . Slurred speech 08/20/2014  . PE (pulmonary embolism) 08/25/2011  . Spinal stenosis of lumbar region at multiple levels 08/23/2011  . Diabetes mellitus ([facility]) 08/23/2011  . Shingles   . Esophageal reflux 07/25/2011  . Unspecified constipation 07/25/2011   Past Medical History:  Diagnosis Date  . Cancer (HCC)    melanoma- R arm , area excised   . Degenerative joint disease   . Diabetes mellitus, type 2 ([facility])   . Fundic gland polyps of stomach, benign   . GERD (gastroesophageal reflux disease)   . Gout   . Helicobacter pylori gastritis   . Hiatal hernia   . Hypertension   . Lumbar spinal stenosis   . Osteoarthritis   . Shingles    current shingles- x4 yrs., on L side of face     . Tubular adenoma of colon 07/2011    Family History  Problem Relation Age of Onset  . Diabetes Mother   . Cancer Mother        mother  . Diabetes Paternal Aunt        x 1  . Diabetes Paternal  Uncle        x 3  . Diabetes Brother   . Diabetes Other        neice  . Heart disease Father   . Cancer Father        gallbladder  . Liver cancer Father   . Heart disease Brother   . Colon polyps Sister   . Colon cancer Neg Hx   . Anesthesia problems Neg Hx   . Hypotension Neg Hx   . Malignant hyperthermia Neg Hx   .  Pseudochol deficiency Neg Hx     Past Surgical History:  Procedure Laterality Date  . AMPUTATION Left 03/04/2016   Procedure: FOOT FIRST RAY AMPUTATION;  Surgeon: Newt Minion, MD;  Location: [facility];  Service: Orthopedics;  Laterality: Left;  . benign br. biopsy- many yrs. ago    . BREAST EXCISIONAL BIOPSY Right   . CARPAL TUNNEL RELEASE     bilateral  . CATARACT EXTRACTION     bilateral, /w IOL  . CHOLECYSTECTOMY  1998  . HEMORRHOID SURGERY     x 2  . LUMBAR LAMINECTOMY     6 prior back surgery   . RETINAL LASER PROCEDURE     bilateral  . ROTATOR CUFF REPAIR     bilateral  . SPINAL FUSION  08/22/2011   Procedure: FUSION POSTERIOR SPINAL MULTILEVEL/SCOLIOSIS;  Surgeon: Gunnar Bulla;  Location: [facility];  Service: Orthopedics;  Laterality: N/A;  LUMBAR LAMINECTOMY, EXTENSION OF POSTERIOR LUMBAR FUSION TO T10 WITH K2M RODS, SCREWS, CONNECTORS, HOOKS, ILIAC CREST BONE GRAFT  . TONSILLECTOMY    . TOTAL KNEE ARTHROPLASTY     left  . VAGINAL HYSTERECTOMY  1990's   Social History   Occupational History  . Not on file  Tobacco Use  . Smoking status: Never Smoker  . Smokeless tobacco: Never Used  Substance and Sexual Activity  . Alcohol use: No  . Drug use: No  . Sexual activity: Not on file

## 2021-01-13 ENCOUNTER — Ambulatory Visit: Payer: Medicare Other | Admitting: Orthopaedic Surgery

## 2021-06-10 ENCOUNTER — Other Ambulatory Visit: Payer: Self-pay

## 2021-06-10 ENCOUNTER — Emergency Department (HOSPITAL_BASED_OUTPATIENT_CLINIC_OR_DEPARTMENT_OTHER)
Admission: EM | Admit: 2021-06-10 | Discharge: 2021-06-10 | Disposition: A | Discharge status: Home / Self Care | Payer: Medicare Other | Attending: Emergency Medicine | Admitting: Emergency Medicine

## 2021-06-10 ENCOUNTER — Encounter (HOSPITAL_BASED_OUTPATIENT_CLINIC_OR_DEPARTMENT_OTHER): Payer: Self-pay | Admitting: Emergency Medicine

## 2021-06-10 DIAGNOSIS — Z79899 Other long term (current) drug therapy: Secondary | ICD-10-CM | POA: Insufficient documentation

## 2021-06-10 DIAGNOSIS — M62831 Muscle spasm of calf: Secondary | ICD-10-CM | POA: Insufficient documentation

## 2021-06-10 DIAGNOSIS — Z96652 Presence of left artificial knee joint: Secondary | ICD-10-CM | POA: Insufficient documentation

## 2021-06-10 DIAGNOSIS — E119 Type 2 diabetes mellitus without complications: Secondary | ICD-10-CM | POA: Insufficient documentation

## 2021-06-10 DIAGNOSIS — M62838 Other muscle spasm: Secondary | ICD-10-CM

## 2021-06-10 DIAGNOSIS — Z7982 Long term (current) use of aspirin: Secondary | ICD-10-CM | POA: Insufficient documentation

## 2021-06-10 DIAGNOSIS — Z89412 Acquired absence of left great toe: Secondary | ICD-10-CM | POA: Insufficient documentation

## 2021-06-10 DIAGNOSIS — R252 Cramp and spasm: Secondary | ICD-10-CM | POA: Diagnosis present

## 2021-06-10 DIAGNOSIS — I10 Essential (primary) hypertension: Secondary | ICD-10-CM | POA: Insufficient documentation

## 2021-06-10 DIAGNOSIS — Z7984 Long term (current) use of oral hypoglycemic drugs: Secondary | ICD-10-CM | POA: Diagnosis not present

## 2021-06-10 DIAGNOSIS — Z85828 Personal history of other malignant neoplasm of skin: Secondary | ICD-10-CM | POA: Diagnosis not present

## 2021-06-10 LAB — CBC WITH DIFFERENTIAL/PLATELET
Abs Immature Granulocytes: 0.04 10*3/uL (ref 0.00–0.07)
Basophils Absolute: 0.1 10*3/uL (ref 0.0–0.1)
Basophils Relative: 1 %
Eosinophils Absolute: 0 10*3/uL (ref 0.0–0.5)
Eosinophils Relative: 0 %
HCT: 33.3 % — ABNORMAL LOW (ref 36.0–46.0)
Hemoglobin: 10.9 g/dL — ABNORMAL LOW (ref 12.0–15.0)
Immature Granulocytes: 1 %
Lymphocytes Relative: 22 %
Lymphs Abs: 1.7 10*3/uL (ref 0.7–4.0)
MCH: 30.2 pg (ref 26.0–34.0)
MCHC: 32.7 g/dL (ref 30.0–36.0)
MCV: 92.2 fL (ref 80.0–100.0)
Monocytes Absolute: 0.5 10*3/uL (ref 0.1–1.0)
Monocytes Relative: 6 %
Neutro Abs: 5.5 10*3/uL (ref 1.7–7.7)
Neutrophils Relative %: 70 %
Platelets: 250 10*3/uL (ref 150–400)
RBC: 3.61 MIL/uL — ABNORMAL LOW (ref 3.87–5.11)
RDW: 14.4 % (ref 11.5–15.5)
WBC: 7.8 10*3/uL (ref 4.0–10.5)
nRBC: 0 % (ref 0.0–0.2)

## 2021-06-10 LAB — BASIC METABOLIC PANEL
Anion gap: 12 (ref 5–15)
BUN: 34 mg/dL — ABNORMAL HIGH (ref 8–23)
CO2: 20 mmol/L — ABNORMAL LOW (ref 22–32)
Calcium: 9.7 mg/dL (ref 8.9–10.3)
Chloride: 106 mmol/L (ref 98–111)
Creatinine, Ser: 0.95 mg/dL (ref 0.44–1.00)
GFR, Estimated: 57 mL/min — ABNORMAL LOW (ref >60)
Glucose, Bld: 222 mg/dL — ABNORMAL HIGH (ref 70–99)
Potassium: 4.7 mmol/L (ref 3.5–5.1)
Sodium: 138 mmol/L (ref 135–145)

## 2021-06-10 MED ORDER — METHOCARBAMOL 500 MG PO TABS: 500.0000 mg | ORAL_TABLET | Freq: Three times a day (TID) | ORAL | 0 refills | Status: DC | PRN

## 2021-06-10 MED ORDER — METHOCARBAMOL 500 MG PO TABS
500.0000 mg | ORAL_TABLET | Freq: Once | ORAL | Status: AC
  Administered 2021-06-10: 500 mg via ORAL
  Filled 2021-06-10: qty 1

## 2021-06-10 NOTE — ED Triage Notes (Signed)
Reports bil leg cramping during the night last night.  Currently feels okay.  Reports hx of the same.

## 2021-06-10 NOTE — ED Provider Notes (Signed)
[facility] EMERGENCY DEPT Provider Note   CSN: [id number] Arrival date & time: 06/10/21  1007     History No chief complaint on file.   [name] is a 87 y.o. female.  Presents to ER with concern for leg cramping.  Patient states that she periodically has leg cramping.  Last night had a particularly bad episode, was having severe pain in both of her legs.  Felt like a cramping sensation in upper and lower legs.  This morning it seemed to subside.  She does not have any pain at present.  Has not had any other acute medical complaints recently.  Reports that she has had prior issues with her spine and has undergone multiple spine surgeries.  Denies any numbness, weakness or tingling today.  No bladder or bowel incontinence.  HPI     Past Medical History:  Diagnosis Date   Cancer ([facility])    melanoma- R arm , area excised    Degenerative joint disease    Diabetes mellitus, type 2 ([facility])    Fundic gland polyps of stomach, benign    GERD (gastroesophageal reflux disease)    Gout    Helicobacter pylori gastritis    Hiatal hernia    Hypertension    Lumbar spinal stenosis    Osteoarthritis    Shingles    current shingles- x4 yrs., on L side of face      Tubular adenoma of colon 07/2011    Patient Active Problem List   Diagnosis Date Noted   History of total left knee replacement 12/16/2020   Unilateral primary osteoarthritis, right knee 07/02/2018   Diabetic foot infection ([facility]) 04/02/2016   Cellulitis of left foot 04/02/2016   Hypokalemia 04/02/2016   Wound infection    Foot abscess, left 03/04/2016   Cellulitis of left lower extremity 03/01/2016   Cellulitis 03/01/2016   Altered mental status 08/20/2014   Slurred speech 08/20/2014   PE (pulmonary embolism) 08/25/2011   Spinal stenosis of lumbar region at multiple levels 08/23/2011   Diabetes mellitus ([facility]) 08/23/2011   Shingles    Esophageal reflux 07/25/2011   Unspecified constipation 07/25/2011     Past Surgical History:  Procedure Laterality Date   AMPUTATION Left 03/04/2016   Procedure: FOOT FIRST RAY AMPUTATION;  Surgeon: Newt Minion, MD;  Location: [facility];  Service: Orthopedics;  Laterality: Left;   benign br. biopsy- many yrs. ago     BREAST EXCISIONAL BIOPSY Right    CARPAL TUNNEL RELEASE     bilateral   CATARACT EXTRACTION     bilateral, /w [place]     x 2   LUMBAR LAMINECTOMY     6 prior back surgery    RETINAL LASER PROCEDURE     bilateral   ROTATOR CUFF REPAIR     bilateral   SPINAL FUSION  08/22/2011   Procedure: FUSION POSTERIOR SPINAL MULTILEVEL/SCOLIOSIS;  Surgeon: Gunnar Bulla;  Location: [facility];  Service: Orthopedics;  Laterality: N/A;  LUMBAR LAMINECTOMY, EXTENSION OF POSTERIOR LUMBAR FUSION TO T10 WITH K2M RODS, SCREWS, CONNECTORS, HOOKS, ILIAC CREST BONE GRAFT   TONSILLECTOMY     TOTAL KNEE ARTHROPLASTY     left   VAGINAL HYSTERECTOMY  1990's     OB History   No obstetric history on file.     Family History  Problem Relation Age of Onset   Diabetes Mother    Cancer Mother  mother   Diabetes Paternal Aunt        x 1   Diabetes Paternal Uncle        x 3   Diabetes Brother    Diabetes Other        neice   Heart disease Father    Cancer Father        gallbladder   Liver cancer Father    Heart disease Brother    Colon polyps Sister    Colon cancer Neg Hx    Anesthesia problems Neg Hx    Hypotension Neg Hx    Malignant hyperthermia Neg Hx    Pseudochol deficiency Neg Hx     Social History   Tobacco Use   Smoking status: Never   Smokeless tobacco: Never  Substance Use Topics   Alcohol use: No   Drug use: No    Home Medications Prior to Admission medications   Medication Sig Start Date End Date Taking? Authorizing Provider  methocarbamol (ROBAXIN) 500 MG tablet Take 1 tablet (500 mg total) by mouth every 8 (eight) hours as needed for muscle spasms. 06/10/21  Yes Lucrezia Starch, MD  allopurinol (ZYLOPRIM) 100 MG tablet Take 1 tablet (100 mg total) by mouth daily. 03/07/16   Oswald Hillock, MD  amLODipine (NORVASC) 5 MG tablet Take 5 mg by mouth daily.      [provider]  aspirin EC 81 MG tablet Take 81 mg by mouth 2 (two) times daily.     [provider]  calcium carbonate (OS-CAL) 600 MG TABS tablet Take 600 mg by mouth daily with breakfast.    [provider]  diclofenac sodium (VOLTAREN) 1 % GEL Apply 2 g topically 4 (four) times daily. 07/02/18   Mcarthur Rossetti, MD  glimepiride (AMARYL) 4 MG tablet Take 4 mg by mouth 2 (two) times daily.    [provider]  HYDROcodone-acetaminophen (NORCO/VICODIN) 5-325 MG tablet Take 1-2 tablets by mouth every 6 (six) hours as needed for moderate pain. 07/02/18   Mcarthur Rossetti, MD  hydrOXYzine (ATARAX/VISTARIL) 50 MG tablet Take 1 tablet (50 mg total) by mouth 3 (three) times daily as needed. 01/02/17   Ladene Artist, MD  LORazepam (ATIVAN) 0.5 MG tablet Take 1 tablet by mouth as needed. 10/10/16   [provider]  metFORMIN (GLUCOPHAGE) 1000 MG tablet Take 1,000 mg by mouth 2 (two) times daily. 02/21/16   [provider]  omeprazole (PRILOSEC) 20 MG capsule Take 20 mg by mouth daily.    [provider]  pregabalin (LYRICA) 75 MG capsule Take 1 capsule (75 mg total) by mouth 2 (two) times daily. 12/16/20   Mcarthur Rossetti, MD  ramipril (ALTACE) 10 MG capsule Take 10 mg by mouth daily.      [provider]    Allergies    Celebrex [celecoxib], Morphine sulfate, Percocet [oxycodone-acetaminophen], and Robinul [glycopyrrolate]  Review of Systems   Review of Systems  Constitutional:  Negative for chills and fever.  HENT:  Negative for ear pain and sore throat.   Eyes:  Negative for pain and visual disturbance.  Respiratory:  Negative for cough and shortness of breath.   Cardiovascular:  Negative for chest pain and palpitations.   Gastrointestinal:  Negative for abdominal pain and vomiting.  Genitourinary:  Negative for dysuria and hematuria.  Musculoskeletal:  Positive for arthralgias and myalgias. Negative for back pain.  Skin:  Negative for color change and rash.  Neurological:  Negative for seizures and syncope.  All other systems reviewed and are negative.  Physical Exam Updated Vital Signs BP (!) 196/53 (BP Location: Right Arm)   Pulse 83   Temp 98.6 F (37 C)   Resp 16   Ht '5\' 2"'$  (1.575 m)   Wt 68.2 kg   SpO2 98%   BMI 27.50 kg/m   Physical Exam Vitals and nursing note reviewed.  Constitutional:      General: She is not in acute distress.    Appearance: She is well-developed.  HENT:     Head: Normocephalic and atraumatic.  Eyes:     Conjunctiva/sclera: Conjunctivae normal.  Cardiovascular:     Rate and Rhythm: Normal rate and regular rhythm.     Heart sounds: No murmur heard. Pulmonary:     Effort: Pulmonary effort is normal. No respiratory distress.  Musculoskeletal:     Cervical back: Neck supple.     Comments: Back: no C, T, L spine TTP, no step off or deformity  RLE:  no TTP throughout, no deformity, normal joint ROM, distal pulse, sensation and motor intact LLE: no TTP throughout, no deformity, normal joint ROM, distal pulse, sensation and motor intact  Skin:    General: Skin is warm and dry.  Neurological:     General: No focal deficit present.     Mental Status: She is alert.     Comments: Normal strength and sensation in bilateral lower extremities  Psychiatric:        Mood and Affect: Mood normal.    ED Results / Procedures / Treatments   Labs (all labs ordered are listed, but only abnormal results are displayed) Labs Reviewed  CBC WITH DIFFERENTIAL/PLATELET - Abnormal; Notable for the following components:      Result Value   RBC 3.61 (*)    Hemoglobin 10.9 (*)    HCT 33.3 (*)    All other components within normal limits  BASIC METABOLIC PANEL - Abnormal; Notable  for the following components:   CO2 20 (*)    Glucose, Bld 222 (*)    BUN 34 (*)    GFR, Estimated 57 (*)    All other components within normal limits    EKG None  Radiology No results found.  Procedures Procedures   Medications Ordered in ED Medications  methocarbamol (ROBAXIN) tablet 500 mg (500 mg Oral Given 06/10/21 1154)    ED Course  I have reviewed the triage vital signs and the nursing notes.  Pertinent labs & imaging results that were available during my care of the patient were reviewed by me and considered in my medical decision making (see chart for details).    MDM Rules/Calculators/A&P                           87 year old lady presents to ER with concern for bilateral leg cramping.  Patient states had particularly bad episode last night.  Symptoms have since resolved.  She does not have any pain, tenderness or swelling in either legs.  Good DP/PT pulses in both feet.  Doubt limb ischemia or DVT given current exam and symptoms.  Does endorse prior histories of spine issues requiring surgery.  No back pain today and neuro intact.  Suspect patient most likely had muscle spasms based on description of symptoms.  Given her lack of ongoing symptoms and reassuring exam, recommended discharge with plan to follow-up with her primary care doctor and her spine  specialist.  Provided Rx for Robaxin.  Discharged home with family.    After the discussed management above, the patient was determined to be safe for discharge.  The patient was in agreement with this plan and all questions regarding their care were answered.  ED return precautions were discussed and the patient will return to the ED with any significant worsening of condition.  Final Clinical Impression(s) / ED Diagnoses Final diagnoses:  Muscle spasms of both lower extremities    Rx / DC Orders ED Discharge Orders          Ordered    methocarbamol (ROBAXIN) 500 MG tablet  Every 8 hours PRN        06/10/21  1151             Lucrezia Starch, MD 06/10/21 1159

## 2021-06-10 NOTE — ED Notes (Signed)
Pt sister called and reported the doctor stated would call in two medications but pharmacy only received one. Contacted pt sister with no answer at contact number. Called pt number on file and spoke with pt. Reviewed pt's AVS and discussed with pt that only one medication was ordered and needed to be picked up. Pt stated "why does the document say medications, medications with an "s" means more than one." Discussed AVS paperwork with pt. Pt agitated and ended call.

## 2021-06-10 NOTE — ED Notes (Signed)
Patient c/o muscle cramping in bilateral legs last night, now resolved, hx of similar in the past.  Patient thinks it is d/t dehydration.

## 2021-06-10 NOTE — Discharge Instructions (Addendum)
Take the muscle relaxer as needed for spasms.  If you develop worsening pain, any leg swelling, numbness, weakness or other new concern symptom, come back to ER for reassessment.  Recommend following up with both your primary care doctor and with your spine specialist.

## 2022-03-07 ENCOUNTER — Emergency Department (HOSPITAL_BASED_OUTPATIENT_CLINIC_OR_DEPARTMENT_OTHER)
Admission: EM | Admit: 2022-03-07 | Discharge: 2022-03-07 | Disposition: A | Discharge status: Home / Self Care | Payer: Medicare Other | Attending: Emergency Medicine | Admitting: Emergency Medicine

## 2022-03-07 ENCOUNTER — Encounter (HOSPITAL_BASED_OUTPATIENT_CLINIC_OR_DEPARTMENT_OTHER): Payer: Self-pay | Admitting: Emergency Medicine

## 2022-03-07 ENCOUNTER — Other Ambulatory Visit: Payer: Self-pay

## 2022-03-07 ENCOUNTER — Emergency Department (HOSPITAL_BASED_OUTPATIENT_CLINIC_OR_DEPARTMENT_OTHER): Payer: Medicare Other | Admitting: Radiology

## 2022-03-07 ENCOUNTER — Other Ambulatory Visit (HOSPITAL_BASED_OUTPATIENT_CLINIC_OR_DEPARTMENT_OTHER): Payer: Self-pay

## 2022-03-07 DIAGNOSIS — I10 Essential (primary) hypertension: Secondary | ICD-10-CM | POA: Diagnosis not present

## 2022-03-07 DIAGNOSIS — Z7982 Long term (current) use of aspirin: Secondary | ICD-10-CM | POA: Insufficient documentation

## 2022-03-07 DIAGNOSIS — S93602A Unspecified sprain of left foot, initial encounter: Secondary | ICD-10-CM | POA: Insufficient documentation

## 2022-03-07 DIAGNOSIS — S20211A Contusion of right front wall of thorax, initial encounter: Secondary | ICD-10-CM | POA: Insufficient documentation

## 2022-03-07 DIAGNOSIS — W01198A Fall on same level from slipping, tripping and stumbling with subsequent striking against other object, initial encounter: Secondary | ICD-10-CM | POA: Insufficient documentation

## 2022-03-07 DIAGNOSIS — Z79899 Other long term (current) drug therapy: Secondary | ICD-10-CM | POA: Diagnosis not present

## 2022-03-07 DIAGNOSIS — W010XXA Fall on same level from slipping, tripping and stumbling without subsequent striking against object, initial encounter: Secondary | ICD-10-CM

## 2022-03-07 DIAGNOSIS — R0781 Pleurodynia: Secondary | ICD-10-CM | POA: Diagnosis present

## 2022-03-07 MED ORDER — ACETAMINOPHEN 325 MG PO TABS
650.0000 mg | ORAL_TABLET | Freq: Once | ORAL | Status: AC
  Administered 2022-03-07: 650 mg via ORAL
  Filled 2022-03-07: qty 2

## 2022-03-07 MED ORDER — METHOCARBAMOL 500 MG PO TABS
500.0000 mg | ORAL_TABLET | Freq: Three times a day (TID) | ORAL | 0 refills | Status: DC | PRN
  Filled 2022-03-07: qty 15, 5d supply, fill #0

## 2022-03-07 NOTE — ED Triage Notes (Signed)
Pt had a mechanical fall a couple of weeks ago hitting her head. Pt also complains of right flank pain radiating to her back related to her fall. Pt has been experiencing nausea but denis emesis  Pt is not on thinners. Pt denis LOC.

## 2022-03-07 NOTE — ED Provider Notes (Signed)
[facility] EMERGENCY DEPT Provider Note   CSN: [id number] Arrival date & time: 03/07/22  1058     History  Chief Complaint  Patient presents with   Back Pain    [name] is a 88 y.o. female.  Pt s/p fall ~ 2 weeks ago. States was getting mail, and left shoe/slipper caught on ground/tripping patient, fell forward. No loc. C/o right lower/lateral rib pain post fall, as well as left foot pain. Symptoms constant, persistent, dull, slowly improving. No headache. No midline or focal neck/back pain, notes general soreness. No radicular pain. No numbness/weakness. No sob. No abd pain or nv. No anticoag use.   The history is provided by the patient, a relative and medical records.  Back Pain Associated symptoms: no abdominal pain, no fever and no headaches       Home Medications Prior to Admission medications   Medication Sig Start Date End Date Taking? Authorizing Provider  allopurinol (ZYLOPRIM) 100 MG tablet Take 1 tablet (100 mg total) by mouth daily. 03/07/16   Oswald Hillock, MD  amLODipine (NORVASC) 5 MG tablet Take 5 mg by mouth daily.      [provider]  aspirin EC 81 MG tablet Take 81 mg by mouth 2 (two) times daily.     [provider]  calcium carbonate (OS-CAL) 600 MG TABS tablet Take 600 mg by mouth daily with breakfast.    [provider]  diclofenac sodium (VOLTAREN) 1 % GEL Apply 2 g topically 4 (four) times daily. 07/02/18   Mcarthur Rossetti, MD  glimepiride (AMARYL) 4 MG tablet Take 4 mg by mouth 2 (two) times daily.    [provider]  HYDROcodone-acetaminophen (NORCO/VICODIN) 5-325 MG tablet Take 1-2 tablets by mouth every 6 (six) hours as needed for moderate pain. 07/02/18   Mcarthur Rossetti, MD  hydrOXYzine (ATARAX/VISTARIL) 50 MG tablet Take 1 tablet (50 mg total) by mouth 3 (three) times daily as needed. 01/02/17   Ladene Artist, MD  LORazepam (ATIVAN) 0.5 MG tablet Take 1 tablet by mouth as  needed. 10/10/16   [provider]  metFORMIN (GLUCOPHAGE) 1000 MG tablet Take 1,000 mg by mouth 2 (two) times daily. 02/21/16   [provider]  methocarbamol (ROBAXIN) 500 MG tablet Take 1 tablet (500 mg total) by mouth every 8 (eight) hours as needed for muscle spasms. 06/10/21   Lucrezia Starch, MD  omeprazole (PRILOSEC) 20 MG capsule Take 20 mg by mouth daily.    [provider]  pregabalin (LYRICA) 75 MG capsule Take 1 capsule (75 mg total) by mouth 2 (two) times daily. 12/16/20   Mcarthur Rossetti, MD  ramipril (ALTACE) 10 MG capsule Take 10 mg by mouth daily.      [provider]      Allergies    Celebrex [celecoxib], Morphine sulfate, Percocet [oxycodone-acetaminophen], and Robinul [glycopyrrolate]    Review of Systems   Review of Systems  Constitutional:  Negative for fever.  HENT:  Negative for nosebleeds.   Eyes:  Negative for redness.  Respiratory:  Negative for shortness of breath.   Cardiovascular:        Right lower/lateral chest pain/contusion  Gastrointestinal:  Negative for abdominal pain and vomiting.  Genitourinary:  Negative for flank pain.  Musculoskeletal:  Negative for back pain and neck pain.  Skin:  Negative for rash.  Neurological:  Negative for headaches.  Hematological:  Does not bruise/bleed easily.  Psychiatric/Behavioral:  Negative for confusion.  Physical Exam Updated Vital Signs BP (!) 180/61 (BP Location: Right Arm)   Pulse 87   Temp 98.2 F (36.8 C) (Oral)   Resp 20   Ht 1.549 m ('5\' 1"'$ )   Wt 63.5 kg   SpO2 95%   BMI 26.45 kg/m  Physical Exam Vitals and nursing note reviewed.  Constitutional:      Appearance: Normal appearance. She is well-developed.  HENT:     Head: Atraumatic.     Nose: Nose normal.     Mouth/Throat:     Mouth: Mucous membranes are moist.  Eyes:     General: No scleral icterus.    Conjunctiva/sclera: Conjunctivae normal.     Pupils: Pupils are equal, round, and reactive  to light.  Neck:     Trachea: No tracheal deviation.  Cardiovascular:     Rate and Rhythm: Normal rate and regular rhythm.     Pulses: Normal pulses.     Heart sounds: Normal heart sounds. No murmur heard.   No friction rub. No gallop.  Pulmonary:     Effort: Pulmonary effort is normal. No respiratory distress.     Breath sounds: Normal breath sounds.     Comments: Right lower/lateral chest wall tenderness. No crepitus. Normal chest movement.  Chest:     Chest wall: Tenderness present.  Abdominal:     General: Bowel sounds are normal. There is no distension.     Palpations: Abdomen is soft.     Tenderness: There is no abdominal tenderness.  Genitourinary:    Comments: No cva tenderness.  Musculoskeletal:        General: No swelling.     Cervical back: Normal range of motion and neck supple. No rigidity or tenderness. No muscular tenderness.     Comments: CTLS spine, non tender, aligned, no step off. Tenderness left mid foot, otherwise good rom bil extremities without pain or focal bony tenderness. Distal pulses palp bil. Left foot of normal color and warmth, no infection noted.   Skin:    General: Skin is warm and dry.     Findings: No rash.  Neurological:     Mental Status: She is alert.     Comments: Alert, speech normal. Motor/sens grossly intact bil.   Psychiatric:        Mood and Affect: Mood normal.    ED Results / Procedures / Treatments   Labs (all labs ordered are listed, but only abnormal results are displayed) Labs Reviewed - No data to display  EKG None  Radiology DG Ribs Unilateral W/Chest Right  Result Date: 03/07/2022 CLINICAL DATA:  Fall 2 weeks prior with right rib pain EXAM: RIGHT RIBS AND CHEST - 3+ VIEW COMPARISON:  08/14/2017 chest radiographs FINDINGS: Partially visualized bilateral posterior spinal fusion hardware extending inferiorly from the lower thoracic spine. Stable cardiomediastinal silhouette with normal heart size. No pneumothorax. No  pleural effusion. Lungs appear clear, with no acute consolidative airspace disease and no pulmonary edema. Cholecystectomy clips are seen in the right upper quadrant of the abdomen. The area of symptomatic concern as indicated by the patient in the lower right chest wall was denoted with a metallic skin BB by the technologist. No fracture or suspicious focal osseous lesions seen in the right ribs. IMPRESSION: No active cardiopulmonary disease. No right rib fracture identified. Electronically Signed   By: Ilona Sorrel M.D.   On: 03/07/2022 12:52   DG Foot Complete Left  Result Date: 03/07/2022 CLINICAL DATA:  fall (2 weeks ago),  pain EXAM: LEFT FOOT - COMPLETE 3+ VIEW COMPARISON:  April 02, 2016 FINDINGS: As before, again seen is the amputation of the first toe distal to the medial cuneiform. There is no evidence of fracture or dislocation. There are some calcifications seen medial to the middle cuneiform. Mild degenerative changes at the midfoot soft tissues are unremarkable. IMPRESSION: No acute fracture or dislocation. Post amputation changes of the first toe. Mild degenerative changes. Electronically Signed   By: Frazier Richards M.D.   On: 03/07/2022 12:47    Procedures Procedures    Medications Ordered in ED Medications - No data to display  ED Course/ Medical Decision Making/ A&P                           Medical Decision Making Problems Addressed: Contusion of right chest wall, initial encounter: acute illness or injury with systemic symptoms that poses a threat to life or bodily functions Essential hypertension: chronic illness or injury Fall from slip, trip, or stumble, initial encounter: acute illness or injury with systemic symptoms that poses a threat to life or bodily functions Sprain of left foot, initial encounter: acute illness or injury  Amount and/or Complexity of Data Reviewed Independent Historian:     Details: family/hx External Data Reviewed: notes. Radiology: ordered and  independent interpretation performed. Decision-making details documented in ED Course.  Risk OTC drugs. Prescription drug management.   Imaging ordered.   Reviewed nursing notes and prior charts for additional history. Additional hx from family member. Outside records reviewed.  Xrays reviewed/interpreted by me - no fx.   Acetaminophen po. Rx robaxin.  Discussed xrays w pt, and discussed possibility rib contusion vs occult fracture/non-displaced fx.  Pt currently appears stable for d/c.   Return precautions provided.             Final Clinical Impression(s) / ED Diagnoses Final diagnoses:  None    Rx / DC Orders ED Discharge Orders     None         Lajean Saver, MD 03/07/22 [phone]

## 2022-03-07 NOTE — Discharge Instructions (Addendum)
It was our pleasure to provide your ER care today - we hope that you feel better.  Take acetaminophen as need. You may also take robaxin as need for muscle pain/spasm.  Fall precautions.   Follow up with primary care doctor in 2-3 weeks if symptoms fail to improve/resolve.   Return to ER if worse, new symptoms, worsening/severe pain, trouble breathing, or other concern.

## 2022-05-24 ENCOUNTER — Telehealth: Payer: Self-pay | Admitting: *Deleted

## 2022-05-24 NOTE — Patient Outreach (Signed)
  Care Coordination   Initial Visit Note   05/24/2022 Name: [name] MRN: [id number] DOB: 07-09-1928  [name] is a 88 y.o. year old female who sees Deon Pilling, NP for primary care. I spoke with  [name] by phone today  What matters to the patients health and wellness today?  Not interested at this time    Goals Addressed   None     SDOH assessments and interventions completed:  No     Care Coordination Interventions Activated:  No  Care Coordination Interventions:  No, not indicated   Follow up plan: No further intervention required.   Encounter Outcome:  Pt. Refused  Eduard Clos MSW, LCSW Licensed Clinical Social Worker      [phone]

## 2022-09-13 ENCOUNTER — Ambulatory Visit (INDEPENDENT_AMBULATORY_CARE_PROVIDER_SITE_OTHER): Payer: Medicare Other | Admitting: Gastroenterology

## 2022-09-13 ENCOUNTER — Encounter: Payer: Self-pay | Admitting: Gastroenterology

## 2022-09-13 VITALS — BP 140/60 | HR 71 | Ht 61.0 in | Wt 146.0 lb

## 2022-09-13 DIAGNOSIS — R197 Diarrhea, unspecified: Secondary | ICD-10-CM

## 2022-09-13 DIAGNOSIS — K580 Irritable bowel syndrome with diarrhea: Secondary | ICD-10-CM

## 2022-09-13 DIAGNOSIS — R152 Fecal urgency: Secondary | ICD-10-CM

## 2022-09-13 DIAGNOSIS — R159 Full incontinence of feces: Secondary | ICD-10-CM

## 2022-09-13 MED ORDER — OMEPRAZOLE 40 MG PO CPDR: 40.0000 mg | DELAYED_RELEASE_CAPSULE | Freq: Every day | ORAL | 3 refills | Status: DC

## 2022-09-13 NOTE — Progress Notes (Signed)
Assessment    IBS with alternating constipation and diarrhea and intermittent fecal incontinence GERD Personal history of adenomatous colon polyps, no longer in surveillance  Recommendations   Avoid foods, beverages that trigger constipation and diarrhea Kegel exercises 5 times daily long-term Increase omeprazole to 40 mg daily and follow antireflux measures Contact us if her symptoms are not controlled within the next 2 to 3 weeks for consideration of further evaluation    HPI   Chief complaint: GERD, diarrhea   Patient profile:  [name] is a 88 y.o. female referred by Deon Pilling, NP for alternating constipation and diarrhea with occasional fecal incontinence.  She has a long history of IBS with intermittent fecal incontinence and she was evaluated for this in our office in 2018.  Glycopyrrolate was recommended in 2018 however she is no longer taking this.  She has been taking Imodium as needed.  She minimizes the use as she has frequent constipation as well.  She notes a gnawing epigastric pain and occasional heartburn.  She relates that a few antidepressant medications recommended by her PCP have caused GI upset and have been discontinued.  She is taking omeprazole 20 mg daily and she was previously treated with 40 mg daily.    Previous Labs / Imaging::    Latest Ref Rng & Units 06/10/2021   10:52 AM 08/14/2017    2:55 PM 04/06/2016    7:22 AM  CBC  WBC 4.0 - 10.5 K/uL 7.8  8.2  6.8   Hemoglobin 12.0 - 15.0 g/dL 10.9  11.8  10.8   Hematocrit 36.0 - 46.0 % 33.3  35.8  34.1   Platelets 150 - 400 K/uL 250  245  247     No results found for: "LIPASE"    Latest Ref Rng & Units 06/10/2021   10:52 AM 08/14/2017    2:55 PM 04/06/2016    7:22 AM  CMP  Glucose 70 - 99 mg/dL 222  198  157   BUN 8 - 23 mg/dL 34  31  11   Creatinine 0.44 - 1.00 mg/dL 0.95  1.29  0.71   Sodium 135 - 145 mmol/L 138  135  138   Potassium 3.5 - 5.1 mmol/L 4.7  4.4  4.4   Chloride 98 -  111 mmol/L 106  104  105   CO2 22 - 32 mmol/L '20  21  23   '$ Calcium 8.9 - 10.3 mg/dL 9.7  9.5  9.7   Total Protein 6.5 - 8.1 g/dL  7.2    Total Bilirubin 0.3 - 1.2 mg/dL  0.5    Alkaline Phos 38 - 126 U/L  65    AST 15 - 41 U/L  43    ALT 14 - 54 U/L  29       Previous GI evaluation    Endoscopies:  EGD Oct 2012 Small HH o/w normal  Colonoscopy Oct 2012 4 small tubular adenomatous polyps   Imaging:     Past Medical History:  Diagnosis Date   Cancer ([place])    melanoma- R arm , area excised    Degenerative joint disease    Depression    Diabetes mellitus, type 2 (HCC)    Fundic gland polyps of stomach, benign    GERD (gastroesophageal reflux disease)    Gout    Helicobacter pylori gastritis    Hiatal hernia    Hypertension    Lumbar spinal stenosis    Osteoarthritis  Shingles    current shingles- x4 yrs., on L side of face      Tubular adenoma of colon 07/17/2011   Past Surgical History:  Procedure Laterality Date   AMPUTATION Left 03/04/2016   Procedure: FOOT FIRST RAY AMPUTATION;  Surgeon: Newt Minion, MD;  Location: [facility];  Service: Orthopedics;  Laterality: Left;   benign br. biopsy- many yrs. ago     BREAST EXCISIONAL BIOPSY Right    CARPAL TUNNEL RELEASE     bilateral   CATARACT EXTRACTION     bilateral, /w [place]     x 2   LUMBAR LAMINECTOMY     6 prior back surgery    RETINAL LASER PROCEDURE     bilateral   ROTATOR CUFF REPAIR     bilateral   SPINAL FUSION  08/22/2011   Procedure: FUSION POSTERIOR SPINAL MULTILEVEL/SCOLIOSIS;  Surgeon: Gunnar Bulla;  Location: [facility];  Service: Orthopedics;  Laterality: N/A;  LUMBAR LAMINECTOMY, EXTENSION OF POSTERIOR LUMBAR FUSION TO T10 WITH K2M RODS, SCREWS, CONNECTORS, HOOKS, ILIAC CREST BONE GRAFT   TONSILLECTOMY     TOTAL KNEE ARTHROPLASTY     left   VAGINAL HYSTERECTOMY  58's   Family History  Problem Relation Age of Onset   Diabetes Mother    Cancer  Mother        mother   Diabetes Paternal Aunt        x 1   Diabetes Paternal Uncle        x 3   Diabetes Brother    Diabetes Other        neice   Heart disease Father    Cancer Father        gallbladder   Liver cancer Father    Heart disease Brother    Colon polyps Sister    Colon cancer Neg Hx    Anesthesia problems Neg Hx    Hypotension Neg Hx    Malignant hyperthermia Neg Hx    Pseudochol deficiency Neg Hx    Social History   Tobacco Use   Smoking status: Never   Smokeless tobacco: Never  Vaping Use   Vaping Use: Never used  Substance Use Topics   Alcohol use: No   Drug use: No   Current Outpatient Medications  Medication Sig Dispense Refill   acetaminophen (8 HR ARTHRITIS PAIN RELIEF) 650 MG CR tablet Take 650 mg by mouth as needed for pain.     allopurinol (ZYLOPRIM) 300 MG tablet Take 300 mg by mouth daily.     amLODipine (NORVASC) 5 MG tablet Take 5 mg by mouth daily.       aspirin EC 81 MG tablet Take 81 mg by mouth daily.     B Complex Vitamins (VITAMIN-B COMPLEX PO) Take 1 tablet by mouth daily in the afternoon.     Calcium Carb-Cholecalciferol (CALCIUM PLUS VITAMIN D3) 600-20 MG-MCG TABS Take 1 tablet by mouth daily in the afternoon.     diclofenac sodium (VOLTAREN) 1 % GEL Apply 2 g topically 4 (four) times daily. (Patient taking differently: Apply 2 g topically as needed.) 100 g 3   glimepiride (AMARYL) 4 MG tablet Take 4 mg by mouth 2 (two) times daily.     metFORMIN (GLUCOPHAGE) 1000 MG tablet Take 1,000 mg by mouth 2 (two) times daily.  1   omeprazole (PRILOSEC) 20 MG capsule Take 20 mg by mouth daily.  ramipril (ALTACE) 10 MG capsule Take 10 mg by mouth daily.       HYDROcodone-acetaminophen (NORCO/VICODIN) 5-325 MG tablet Take 1-2 tablets by mouth every 6 (six) hours as needed for moderate pain. (Patient not taking: Reported on 09/13/2022) 30 tablet 0   hydrOXYzine (ATARAX/VISTARIL) 50 MG tablet Take 1 tablet (50 mg total) by mouth 3 (three) times  daily as needed. (Patient not taking: Reported on 09/13/2022) 30 tablet 0   LORazepam (ATIVAN) 0.5 MG tablet Take 1 tablet by mouth as needed. (Patient not taking: Reported on 09/13/2022)  3   methocarbamol (ROBAXIN) 500 MG tablet Take 1 tablet (500 mg total) by mouth 3 (three) times daily as needed (muscle spasm/pain). (Patient not taking: Reported on 09/13/2022) 15 tablet 0   No current facility-administered medications for this visit.   Allergies  Allergen Reactions   Celebrex [Celecoxib] Swelling   Morphine Sulfate Nausea Only   Percocet [Oxycodone-Acetaminophen] Itching   Robinul [Glycopyrrolate] Rash    Review of Systems: All other systems reviewed and negative except where noted in HPI.    Physical Exam    Wt Readings from Last 3 Encounters:  09/13/22 146 lb (66.2 kg)  03/07/22 140 lb (63.5 kg)  06/10/21 150 lb 5.7 oz (68.2 kg)    BP (!) 140/60   Pulse 71   Ht '5\' 1"'$  (1.549 m)   Wt 146 lb (66.2 kg)   BMI 27.59 kg/m  Constitutional:  Generally well appearing elderly female in no acute distress. HEENT: Pupils normal.  Conjunctivae are normal. No scleral icterus. Neck: supple.  Cardiovascular: Normal rate, regular rhythm. No murmurs. Pulmonary/chest: Effort normal and breath sounds normal. No wheezing, rales or rhonchi. Abdominal: Soft, nondistended, nontender. Active bowel sounds. No palpable HSM, masses or hernias. Rectal: Decreased sphincter tone and squeeze, no lesions, no tenderness Neurological: Alert and oriented to person, place and time. Psychiatric: Pleasant. Normal mood and affect. Behavior is normal. Skin: Skin is warm and dry. No rashes noted.  Lucio Edward, MD   cc:  Referring Provider Deon Pilling, NP

## 2022-09-13 NOTE — Patient Instructions (Signed)
You can take over the counter Imodium 1-2 tablets by mouth three times a day as needed.   Increase your omeprazole to 40 mg daily. A new prescription has been sent to your mail order pharmacy.   Kegel Exercises five times a day.  Kegel exercises can help strengthen your pelvic floor muscles. The pelvic floor is a group of muscles that support your rectum, small intestine, and bladder. In females, pelvic floor muscles also help support the uterus. These muscles help you control the flow of urine and stool (feces). Kegel exercises are painless and simple. They do not require any equipment. Your provider may suggest Kegel exercises to: Improve bladder and bowel control. Improve sexual response. Improve weak pelvic floor muscles after surgery to remove the uterus (hysterectomy) or after pregnancy, in females. Improve weak pelvic floor muscles after prostate gland removal or surgery, in males. Kegel exercises involve squeezing your pelvic floor muscles. These are the same muscles you squeeze when you try to stop the flow of urine or keep from passing gas. The exercises can be done while sitting, standing, or lying down, but it is best to vary your position. Ask your health care provider which exercises are safe for you. Do exercises exactly as told by your health care provider and adjust them as directed. Do not begin these exercises until told by your health care provider. Exercises How to do Kegel exercises: Squeeze your pelvic floor muscles tight. You should feel a tight lift in your rectal area. If you are a female, you should also feel a tightness in your vaginal area. Keep your stomach, buttocks, and legs relaxed. Hold the muscles tight for up to 10 seconds. Breathe normally. Relax your muscles for up to 10 seconds. Repeat as told by your health care provider. Repeat this exercise daily as told by your health care provider. Continue to do this exercise for at least 4-6 weeks, or for as long as  told by your health care provider. You may be referred to a physical therapist who can help you learn more about how to do Kegel exercises. Depending on your condition, your health care provider may recommend: Varying how long you squeeze your muscles. Doing several sets of exercises every day. Doing exercises for several weeks. Making Kegel exercises a part of your regular exercise routine. This information is not intended to replace advice given to you by your health care provider. Make sure you discuss any questions you have with your health care provider. Document Revised: 02/10/2021 Document Reviewed: 02/10/2021 Elsevier Patient Education  [place] providers would like to encourage you to use [facility] to communicate with providers for non-urgent requests or questions.  Due to long hold times on the telephone, sending your provider a message by [facility] may be a faster and more efficient way to get a response.  Please allow 48 business hours for a response.  Please remember that this is for non-urgent requests.   Thank you for choosing me and [facility].  Pricilla Riffle. Dagoberto Ligas., MD., Marval Regal

## 2022-09-28 ENCOUNTER — Telehealth: Payer: Self-pay | Admitting: Gastroenterology

## 2022-09-28 NOTE — Telephone Encounter (Signed)
Patient states she is confused about her prescription for omeprazole. Patient states she was told by Dr. Fuller Plan at her last appt to take omeprazole twice daily but when she received the new prescription it was once daily. She said it could have been a different milligram but she has thrown out the old prescription bottle. Informed patient that it is a increase in milligram and Dr. Fuller Plan may have told her to take 2 20 mg omeprazole until she gets the new prescription for omeprazole 40 mg daily in the mail. Patient verbalized understanding and no more questions.

## 2022-09-28 NOTE — Telephone Encounter (Signed)
Patient is calling regarding Ompeprazole Rx she was supposed to receive from last appt but states she has yet to receive it. Wants it sent through [facility] mail order. Please advise

## 2022-12-15 DEATH — deceased

## 2023-01-02 ENCOUNTER — Other Ambulatory Visit (HOSPITAL_COMMUNITY): Payer: Self-pay

## 2023-04-11 ENCOUNTER — Telehealth: Payer: Self-pay | Admitting: Gastroenterology

## 2023-04-11 NOTE — Telephone Encounter (Signed)
Patient's sister Bonita Quin is calling states patient is having uncontrollable bowels, she is unable to hold them in and her stomach is not feeling well. Says patient cannot wait until September to be seen and is wishing to speak with a nurse regarding recommendations. Please advise

## 2023-04-12 NOTE — Telephone Encounter (Signed)
Line busy

## 2023-04-13 NOTE — Telephone Encounter (Signed)
The pt has been having frequent stools 4-6 daily.  She states she can not wait for an appt in September.  She has some abd cramping but no bleeding or mucous.  Appt made to see Hyacinth Meeker PA on 7/5 at 1130 am

## 2023-04-13 NOTE — Telephone Encounter (Signed)
She does have a history of IBS alternating constipation diarrhea.  She is not taking imodium or other meds at this time.  She wants to know what is causing this before she takes anything.

## 2023-04-20 ENCOUNTER — Encounter: Payer: Self-pay | Admitting: Physician Assistant

## 2023-04-20 ENCOUNTER — Ambulatory Visit (INDEPENDENT_AMBULATORY_CARE_PROVIDER_SITE_OTHER): Payer: Medicare Other | Admitting: Physician Assistant

## 2023-04-20 VITALS — BP 158/60 | HR 66 | Ht 61.0 in | Wt 142.0 lb

## 2023-04-20 DIAGNOSIS — K580 Irritable bowel syndrome with diarrhea: Secondary | ICD-10-CM | POA: Diagnosis not present

## 2023-04-20 MED ORDER — HYOSCYAMINE SULFATE 0.125 MG SL SUBL: 0.1250 mg | SUBLINGUAL_TABLET | Freq: Four times a day (QID) | SUBLINGUAL | 5 refills | Status: DC | PRN

## 2023-04-20 NOTE — Patient Instructions (Addendum)
We have sent the following medications to your pharmacy for you to pick up at your convenience: Hyoscyamine 0.125 SL 1 tablet every 6 hours as needed for cramping.  _______________________________________________________  If your blood pressure at your visit was 140/90 or greater, please contact your primary care physician to follow up on this.  _______________________________________________________  If you are age 87 or older, your body mass index should be between 23-30. Your Body mass index is 26.83 kg/m. If this is out of the aforementioned range listed, please consider follow up with your Primary Care Provider.  If you are age 10 or younger, your body mass index should be between 19-25. Your Body mass index is 26.83 kg/m. If this is out of the aformentioned range listed, please consider follow up with your Primary Care Provider.   ________________________________________________________  The [facility] providers would like to encourage you to use [facility] to communicate with providers for non-urgent requests or questions.  Due to long hold times on the telephone, sending your provider a message by [facility] may be a faster and more efficient way to get a response.  Please allow 48 business hours for a response.  Please remember that this is for non-urgent requests.  _______________________________________________________

## 2023-04-20 NOTE — Progress Notes (Signed)
Chief Complaint: Diarrhea  HPI:    [name] is a 89 year old Caucasian female with past medical history as listed below including diabetes and GERD, known to Dr. Russella Dar, who presents to clinic today with a complaint of diarrhea.    09/13/2022 patient seen in clinic by Dr. Russella Dar.  At that time discussed alternating constipation and diarrhea with occasional fecal incontinence.  Longstanding history of IBS with intermittent fecal incontinence.  Glycopyrrolate was recommended in 2018 but she was not taking.  At that time recommended that she avoid foods and beverages that trigger constipation/diarrhea.  Recommended Kegel exercises 5 times a day and increase Omeprazole to 40 mg daily.       04/13/2023 patient called and described having frequent stools 4-6 a day.  Also some abdominal pain.    Today, the patient tells me that she continues with diarrheal stools and abdominal cramping but not always together.  About once a month she has an urgent loose stool which can occur at any point and she really does not have belly pain then.  She has been trying to use Imodium if she knows she is going out somewhere but sometimes forgets to do this.  It is helpful when she does.    More concerning to the patient today is abdominal cramping mostly in her lower abdomen/lower right side but it "moves around".  Tells me that this comes and goes, she thinks sometimes worse if she is on certain medicines including Gabapentin and her thyroid meds.  Though she is stopped both of these and she continues with cramping.  It does not always exist around the bowel movement.    Denies fever, chills, weight loss or blood in her stools.  Past Medical History:  Diagnosis Date   Cancer (HCC)    melanoma- R arm , area excised    Degenerative joint disease    Depression    Diabetes mellitus, type 2 (HCC)    Fundic gland polyps of stomach, benign    GERD (gastroesophageal reflux disease)    Gout    Helicobacter pylori gastritis     Hiatal hernia    Hypertension    Lumbar spinal stenosis    Osteoarthritis    Shingles    current shingles- x4 yrs., on L side of face      Tubular adenoma of colon 07/17/2011    Past Surgical History:  Procedure Laterality Date   AMPUTATION Left 03/04/2016   Procedure: FOOT FIRST RAY AMPUTATION;  Surgeon: Nadara Mustard, MD;  Location: [facility] OR;  Service: Orthopedics;  Laterality: Left;   benign br. biopsy- many yrs. ago     BREAST EXCISIONAL BIOPSY Right    CARPAL TUNNEL RELEASE     bilateral   CATARACT EXTRACTION     bilateral, /w IOL   CHOLECYSTECTOMY  1998   HEMORRHOID SURGERY     x 2   LUMBAR LAMINECTOMY     6 prior back surgery    RETINAL LASER PROCEDURE     bilateral   ROTATOR CUFF REPAIR     bilateral   SPINAL FUSION  08/22/2011   Procedure: FUSION POSTERIOR SPINAL MULTILEVEL/SCOLIOSIS;  Surgeon: Charlsie Quest;  Location: [facility] OR;  Service: Orthopedics;  Laterality: N/A;  LUMBAR LAMINECTOMY, EXTENSION OF POSTERIOR LUMBAR FUSION TO T10 WITH K2M RODS, SCREWS, CONNECTORS, HOOKS, ILIAC CREST BONE GRAFT   TONSILLECTOMY     TOTAL KNEE ARTHROPLASTY     left   VAGINAL HYSTERECTOMY  1990's  Current Outpatient Medications  Medication Sig Dispense Refill   acetaminophen (8 HR ARTHRITIS PAIN RELIEF) 650 MG CR tablet Take 650 mg by mouth as needed for pain.     allopurinol (ZYLOPRIM) 300 MG tablet Take 300 mg by mouth daily.     amLODipine (NORVASC) 5 MG tablet Take 5 mg by mouth daily.       aspirin EC 81 MG tablet Take 81 mg by mouth daily.     B Complex Vitamins (VITAMIN-B COMPLEX PO) Take 1 tablet by mouth daily in the afternoon.     Calcium Carb-Cholecalciferol (CALCIUM PLUS VITAMIN D3) 600-20 MG-MCG TABS Take 1 tablet by mouth daily in the afternoon.     diclofenac sodium (VOLTAREN) 1 % GEL Apply 2 g topically 4 (four) times daily. (Patient taking differently: Apply 2 g topically as needed.) 100 g 3   escitalopram (LEXAPRO) 5 MG tablet Take 5 mg by mouth daily.      glimepiride (AMARYL) 4 MG tablet Take 4 mg by mouth 2 (two) times daily.     HYDROcodone-acetaminophen (NORCO/VICODIN) 5-325 MG tablet Take 1-2 tablets by mouth every 6 (six) hours as needed for moderate pain. (Patient not taking: Reported on 09/13/2022) 30 tablet 0   hydrOXYzine (ATARAX/VISTARIL) 50 MG tablet Take 1 tablet (50 mg total) by mouth 3 (three) times daily as needed. (Patient not taking: Reported on 09/13/2022) 30 tablet 0   LORazepam (ATIVAN) 0.5 MG tablet Take 1 tablet by mouth as needed. (Patient not taking: Reported on 09/13/2022)  3   metFORMIN (GLUCOPHAGE) 1000 MG tablet Take 1,000 mg by mouth 2 (two) times daily.  1   methocarbamol (ROBAXIN) 500 MG tablet Take 1 tablet (500 mg total) by mouth 3 (three) times daily as needed (muscle spasm/pain). (Patient not taking: Reported on 09/13/2022) 15 tablet 0   omeprazole (PRILOSEC) 40 MG capsule Take 1 capsule (40 mg total) by mouth daily. 90 capsule 3   ramipril (ALTACE) 10 MG capsule Take 10 mg by mouth daily.       No current facility-administered medications for this visit.    Allergies as of 04/20/2023 - Review Complete 09/13/2022  Allergen Reaction Noted   Celebrex [celecoxib] Swelling 08/16/2011   Morphine sulfate Nausea Only 08/16/2011   Percocet [oxycodone-acetaminophen] Itching 08/16/2011   Robinul [glycopyrrolate] Rash 01/02/2017    Family History  Problem Relation Age of Onset   Diabetes Mother    Cancer Mother        mother   Diabetes Paternal Aunt        x 1   Diabetes Paternal Uncle        x 3   Diabetes Brother    Diabetes Other        neice   Heart disease Father    Cancer Father        gallbladder   Liver cancer Father    Heart disease Brother    Colon polyps Sister    Colon cancer Neg Hx    Anesthesia problems Neg Hx    Hypotension Neg Hx    Malignant hyperthermia Neg Hx    Pseudochol deficiency Neg Hx     Social History   Socioeconomic History   Marital status: Divorced    Spouse name:  Not on file   Number of children: Not on file   Years of education: Not on file   Highest education level: Not on file  Occupational History   Not on file  Tobacco Use  Smoking status: Never   Smokeless tobacco: Never  Vaping Use   Vaping Use: Never used  Substance and Sexual Activity   Alcohol use: No   Drug use: No   Sexual activity: Not on file  Other Topics Concern   Not on file  Social History Narrative   Not on file   Social Determinants of Health   Financial Resource Strain: Not on file  Food Insecurity: Not on file  Transportation Needs: Not on file  Physical Activity: Not on file  Stress: Not on file  Social Connections: Not on file  Intimate Partner Violence: Not on file    Review of Systems:    Constitutional: No weight loss, fever or chills Cardiovascular: No chest pain   Respiratory: No SOB Gastrointestinal: See HPI and otherwise negative   Physical Exam:  Vital signs: BP (!) 158/60   Pulse 66   Ht 5\' 1"  (1.549 m)   Wt 142 lb (64.4 kg)   BMI 26.83 kg/m    Constitutional:   Pleasant Elderly Caucasian female appears to be in NAD, Well developed, Well nourished, alert and cooperative Respiratory: Respirations even and unlabored. Lungs clear to auscultation bilaterally.   No wheezes, crackles, or rhonchi.  Cardiovascular: Normal S1, S2. No MRG. Regular rate and rhythm. No peripheral edema, cyanosis or pallor.  Gastrointestinal:  Soft, nondistended, nontender. No rebound or guarding. Normal bowel sounds. No appreciable masses or hepatomegaly. Rectal:  Not performed.  Psychiatric: Demonstrates good judgement and reason without abnormal affect or behaviors.  No recent labs or imaging.  Assessment: 1.  IBS-D: Sometimes with urgent loose incontinent stools about once a month, helped by Imodium, other times just lower abdominal cramping  Plan: 1.  Continue Imodium as needed when you know you are going out somewhere 2.  Prescribed Hyoscyamine sulfate  0.125 mg sublingual tabs, 1 tab every 6 hours as needed for abdominal cramping.  She does not have this every day but it would be nice to have something on hand when it hits her. 3.  Discussed that Gabapentin was really helping her, she enjoys gardening and was able to do this while on the medicine but had stopped it as she thought it was causing stomach pain.  Explained that I do not think that it is and she could trial going back on this. 4.  Did discuss that Metformin could be adding to her diarrhea. 5.  Patient to follow in clinic as needed.  Hyacinth Meeker, PA-C [facility] 04/20/2023, 11:18 AM  Cc: Linus Galas, NP

## 2023-08-02 ENCOUNTER — Telehealth: Payer: Self-pay | Admitting: Gastroenterology

## 2023-08-02 NOTE — Telephone Encounter (Signed)
PT is calling to let us know that she will like to be with a PT of Dr. Lamar Sprinkles once Dr. Georgia Dom

## 2023-10-14 ENCOUNTER — Other Ambulatory Visit: Payer: Self-pay | Admitting: Gastroenterology

## 2024-01-29 ENCOUNTER — Ambulatory Visit: Payer: Medicare Other | Admitting: Internal Medicine

## 2024-04-30 ENCOUNTER — Emergency Department (HOSPITAL_COMMUNITY)

## 2024-04-30 ENCOUNTER — Other Ambulatory Visit: Payer: Self-pay

## 2024-04-30 ENCOUNTER — Inpatient Hospital Stay (HOSPITAL_COMMUNITY)
Admission: EM | Admit: 2024-04-30 | Discharge: 2024-05-02 | DRG: 690 | Disposition: A | Discharge status: Home Health | Attending: Internal Medicine | Admitting: Internal Medicine

## 2024-04-30 DIAGNOSIS — E872 Acidosis, unspecified: Secondary | ICD-10-CM | POA: Diagnosis present

## 2024-04-30 DIAGNOSIS — Z7982 Long term (current) use of aspirin: Secondary | ICD-10-CM

## 2024-04-30 DIAGNOSIS — R42 Dizziness and giddiness: Secondary | ICD-10-CM | POA: Diagnosis not present

## 2024-04-30 DIAGNOSIS — Z880 Allergy status to penicillin: Secondary | ICD-10-CM

## 2024-04-30 DIAGNOSIS — Z89412 Acquired absence of left great toe: Secondary | ICD-10-CM

## 2024-04-30 DIAGNOSIS — F32A Depression, unspecified: Secondary | ICD-10-CM | POA: Diagnosis present

## 2024-04-30 DIAGNOSIS — Z8249 Family history of ischemic heart disease and other diseases of the circulatory system: Secondary | ICD-10-CM

## 2024-04-30 DIAGNOSIS — Z885 Allergy status to narcotic agent status: Secondary | ICD-10-CM

## 2024-04-30 DIAGNOSIS — Z886 Allergy status to analgesic agent status: Secondary | ICD-10-CM

## 2024-04-30 DIAGNOSIS — Z888 Allergy status to other drugs, medicaments and biological substances status: Secondary | ICD-10-CM

## 2024-04-30 DIAGNOSIS — Z833 Family history of diabetes mellitus: Secondary | ICD-10-CM

## 2024-04-30 DIAGNOSIS — Z79899 Other long term (current) drug therapy: Secondary | ICD-10-CM

## 2024-04-30 DIAGNOSIS — Z809 Family history of malignant neoplasm, unspecified: Secondary | ICD-10-CM

## 2024-04-30 DIAGNOSIS — Z96652 Presence of left artificial knee joint: Secondary | ICD-10-CM | POA: Diagnosis present

## 2024-04-30 DIAGNOSIS — N390 Urinary tract infection, site not specified: Secondary | ICD-10-CM | POA: Diagnosis not present

## 2024-04-30 DIAGNOSIS — M109 Gout, unspecified: Secondary | ICD-10-CM | POA: Diagnosis present

## 2024-04-30 DIAGNOSIS — B961 Klebsiella pneumoniae [K. pneumoniae] as the cause of diseases classified elsewhere: Secondary | ICD-10-CM | POA: Diagnosis present

## 2024-04-30 DIAGNOSIS — E1142 Type 2 diabetes mellitus with diabetic polyneuropathy: Secondary | ICD-10-CM | POA: Diagnosis present

## 2024-04-30 DIAGNOSIS — Z860101 Personal history of adenomatous and serrated colon polyps: Secondary | ICD-10-CM

## 2024-04-30 DIAGNOSIS — D649 Anemia, unspecified: Secondary | ICD-10-CM | POA: Diagnosis present

## 2024-04-30 DIAGNOSIS — I10 Essential (primary) hypertension: Secondary | ICD-10-CM | POA: Diagnosis present

## 2024-04-30 DIAGNOSIS — N179 Acute kidney failure, unspecified: Secondary | ICD-10-CM | POA: Diagnosis present

## 2024-04-30 DIAGNOSIS — Z83719 Family history of colon polyps, unspecified: Secondary | ICD-10-CM

## 2024-04-30 DIAGNOSIS — Z7984 Long term (current) use of oral hypoglycemic drugs: Secondary | ICD-10-CM

## 2024-04-30 DIAGNOSIS — K219 Gastro-esophageal reflux disease without esophagitis: Secondary | ICD-10-CM | POA: Diagnosis present

## 2024-04-30 DIAGNOSIS — Z85820 Personal history of malignant melanoma of skin: Secondary | ICD-10-CM

## 2024-04-30 DIAGNOSIS — Z800 Family history of malignant neoplasm of digestive organs: Secondary | ICD-10-CM

## 2024-04-30 DIAGNOSIS — Z981 Arthrodesis status: Secondary | ICD-10-CM

## 2024-04-30 LAB — CBC WITH DIFFERENTIAL/PLATELET
Abs Immature Granulocytes: 0.04 K/uL (ref 0.00–0.07)
Basophils Absolute: 0.1 K/uL (ref 0.0–0.1)
Basophils Relative: 1 %
Eosinophils Absolute: 0 K/uL (ref 0.0–0.5)
Eosinophils Relative: 0 %
HCT: 35.3 % — ABNORMAL LOW (ref 36.0–46.0)
Hemoglobin: 11.2 g/dL — ABNORMAL LOW (ref 12.0–15.0)
Immature Granulocytes: 0 %
Lymphocytes Relative: 25 %
Lymphs Abs: 2.3 K/uL (ref 0.7–4.0)
MCH: 29.8 pg (ref 26.0–34.0)
MCHC: 31.7 g/dL (ref 30.0–36.0)
MCV: 93.9 fL (ref 80.0–100.0)
Monocytes Absolute: 0.6 K/uL (ref 0.1–1.0)
Monocytes Relative: 6 %
Neutro Abs: 6.2 K/uL (ref 1.7–7.7)
Neutrophils Relative %: 68 %
Platelets: 252 K/uL (ref 150–400)
RBC: 3.76 MIL/uL — ABNORMAL LOW (ref 3.87–5.11)
RDW: 14.5 % (ref 11.5–15.5)
WBC: 9.2 K/uL (ref 4.0–10.5)
nRBC: 0 % (ref 0.0–0.2)

## 2024-04-30 LAB — COMPREHENSIVE METABOLIC PANEL WITH GFR
ALT: 17 U/L (ref 0–44)
AST: 27 U/L (ref 15–41)
Albumin: 4.2 g/dL (ref 3.5–5.0)
Alkaline Phosphatase: 77 U/L (ref 38–126)
Anion gap: 13 (ref 5–15)
BUN: 25 mg/dL — ABNORMAL HIGH (ref 8–23)
CO2: 23 mmol/L (ref 22–32)
Calcium: 9.3 mg/dL (ref 8.9–10.3)
Chloride: 103 mmol/L (ref 98–111)
Creatinine, Ser: 1.06 mg/dL — ABNORMAL HIGH (ref 0.44–1.00)
GFR, Estimated: 49 mL/min — ABNORMAL LOW (ref >60)
Glucose, Bld: 141 mg/dL — ABNORMAL HIGH (ref 70–99)
Potassium: 4.1 mmol/L (ref 3.5–5.1)
Sodium: 139 mmol/L (ref 135–145)
Total Bilirubin: 0.6 mg/dL (ref 0.0–1.2)
Total Protein: 7.6 g/dL (ref 6.5–8.1)

## 2024-04-30 LAB — I-STAT CHEM 8, ED
BUN: 23 mg/dL (ref 8–23)
Calcium, Ion: 1.25 mmol/L (ref 1.15–1.40)
Chloride: 104 mmol/L (ref 98–111)
Creatinine, Ser: 1.1 mg/dL — ABNORMAL HIGH (ref 0.44–1.00)
Glucose, Bld: 137 mg/dL — ABNORMAL HIGH (ref 70–99)
HCT: 35 % — ABNORMAL LOW (ref 36.0–46.0)
Hemoglobin: 11.9 g/dL — ABNORMAL LOW (ref 12.0–15.0)
Potassium: 4.1 mmol/L (ref 3.5–5.1)
Sodium: 141 mmol/L (ref 135–145)
TCO2: 22 mmol/L (ref 22–32)

## 2024-04-30 LAB — I-STAT CG4 LACTIC ACID, ED: Lactic Acid, Venous: 3.3 mmol/L (ref 0.5–1.9)

## 2024-04-30 LAB — URINALYSIS, W/ REFLEX TO CULTURE (INFECTION SUSPECTED)
Bilirubin Urine: NEGATIVE
Glucose, UA: NEGATIVE mg/dL
Hgb urine dipstick: NEGATIVE
Ketones, ur: NEGATIVE mg/dL
Nitrite: NEGATIVE
Protein, ur: NEGATIVE mg/dL
Specific Gravity, Urine: 1.005 (ref 1.005–1.030)
pH: 6 (ref 5.0–8.0)

## 2024-04-30 LAB — ETHANOL: Alcohol, Ethyl (B): <15 mg/dL (ref <15)

## 2024-04-30 LAB — GLUCOSE, CAPILLARY
Glucose-Capillary: 182 mg/dL — ABNORMAL HIGH (ref 70–99)
Glucose-Capillary: 135 mg/dL — ABNORMAL HIGH (ref 70–99)

## 2024-04-30 LAB — TROPONIN I (HIGH SENSITIVITY)
Troponin I (High Sensitivity): 9 ng/L (ref <18)
Troponin I (High Sensitivity): 10 ng/L (ref <18)

## 2024-04-30 LAB — TYPE AND SCREEN
ABO/RH(D): B POS
Antibody Screen: NEGATIVE

## 2024-04-30 MED ORDER — SODIUM CHLORIDE 0.9 % IV SOLN
1.0000 g | Freq: Once | INTRAVENOUS | Status: AC
  Administered 2024-04-30: 1 g via INTRAVENOUS
  Filled 2024-04-30: qty 10

## 2024-04-30 MED ORDER — INSULIN ASPART 100 UNIT/ML IJ SOLN
0.0000 [IU] | Freq: Every day | INTRAMUSCULAR | Status: DC
  Filled 2024-04-30: qty 0.05

## 2024-04-30 MED ORDER — ONDANSETRON HCL 4 MG PO TABS: 4.0000 mg | ORAL_TABLET | Freq: Four times a day (QID) | ORAL | Status: DC | PRN

## 2024-04-30 MED ORDER — INSULIN ASPART 100 UNIT/ML IJ SOLN
0.0000 [IU] | Freq: Three times a day (TID) | INTRAMUSCULAR | Status: DC
  Administered 2024-04-30 – 2024-05-02 (×4): 3 [IU] via SUBCUTANEOUS
  Filled 2024-04-30: qty 0.15

## 2024-04-30 MED ORDER — MECLIZINE HCL 25 MG PO TABS: 25.0000 mg | ORAL_TABLET | Freq: Three times a day (TID) | ORAL | Status: DC | PRN

## 2024-04-30 MED ORDER — ONDANSETRON HCL 4 MG/2ML IJ SOLN: 4.0000 mg | Freq: Four times a day (QID) | INTRAMUSCULAR | Status: DC | PRN

## 2024-04-30 MED ORDER — SODIUM CHLORIDE 0.9 % IV BOLUS
1000.0000 mL | Freq: Once | INTRAVENOUS | Status: AC
  Administered 2024-04-30: 1000 mL via INTRAVENOUS

## 2024-04-30 MED ORDER — TRAZODONE HCL 50 MG PO TABS
25.0000 mg | ORAL_TABLET | Freq: Every evening | ORAL | Status: DC | PRN
  Administered 2024-05-01: 25 mg via ORAL
  Filled 2024-04-30: qty 1

## 2024-04-30 MED ORDER — LORAZEPAM 0.5 MG PO TABS
0.5000 mg | ORAL_TABLET | Freq: Once | ORAL | Status: AC
  Administered 2024-04-30: 0.5 mg via ORAL
  Filled 2024-04-30: qty 1

## 2024-04-30 MED ORDER — ALBUTEROL SULFATE (2.5 MG/3ML) 0.083% IN NEBU
2.5000 mg | INHALATION_SOLUTION | RESPIRATORY_TRACT | Status: DC | PRN
  Filled 2024-04-30: qty 3

## 2024-04-30 MED ORDER — TRAMADOL HCL 50 MG PO TABS: 50.0000 mg | ORAL_TABLET | Freq: Three times a day (TID) | ORAL | Status: DC | PRN

## 2024-04-30 MED ORDER — ENOXAPARIN SODIUM 30 MG/0.3ML IJ SOSY
30.0000 mg | PREFILLED_SYRINGE | INTRAMUSCULAR | Status: DC
  Filled 2024-04-30: qty 0.3

## 2024-04-30 MED ORDER — SODIUM CHLORIDE 0.9 % IV BOLUS: 500.0000 mL | Freq: Once | INTRAVENOUS | Status: DC

## 2024-04-30 MED ORDER — SODIUM CHLORIDE 0.9 % IV SOLN: INTRAVENOUS | Status: AC

## 2024-04-30 NOTE — H&P (Signed)
 History and Physical  GWENNA FUSTON FMW:[id number] DOB: 04/30/1928 DOA: 04/30/2024  PCP: Corlis Pagan, NP   Chief Complaint: Dizziness  HPI: BRITTNI HULT is a 89 y.o. female with medical history significant for type 2 diabetes, hypertension, GERD being admitted to the hospital with dizziness.  History is provided by the patient, who lives on her own and is quite active although she walks with a cane.  States that over the past few months, she probably had some mild intermittent episodes of dizziness.  States that she probably does not drink enough water, her son and sister are always telling her that.  In any case, she was in her usual state of health until this morning when she got up to use the bathroom.  She had severe dizziness, without associated palpitations, chest pain, or syncope.  States that she would have fallen if she was not using her cane or able to hold onto the wall in the hallway.  She denies any new medications, or changes in her medication dosages.  Denies any recent vomiting, diarrhea, dysuria or any other concerns.  Evaluation in the emergency department is relatively benign, including MRI of the brain which does not show any acute process.  Patient would like to be discharged home, however she was unable to ambulate in the ER without significant dizziness, after which ER provider requested hospitalist observation.  Review of Systems: Please see HPI for pertinent positives and negatives. A complete 10 system review of systems are otherwise negative.  Past Medical History:  Diagnosis Date   Cancer (HCC)    melanoma- R arm , area excised    Degenerative joint disease    Depression    Diabetes mellitus, type 2 (HCC)    Fundic gland polyps of stomach, benign    GERD (gastroesophageal reflux disease)    Gout    Helicobacter pylori gastritis    Hiatal hernia    Hypertension    Lumbar spinal stenosis    Osteoarthritis    Shingles    current shingles- x4 yrs., on L  side of face      Tubular adenoma of colon 07/17/2011   Past Surgical History:  Procedure Laterality Date   AMPUTATION Left 03/04/2016   Procedure: FOOT FIRST RAY AMPUTATION;  Surgeon: Jerona Harden GAILS, MD;  Location: [facility] OR;  Service: Orthopedics;  Laterality: Left;   benign br. biopsy- many yrs. ago     BREAST EXCISIONAL BIOPSY Right    CARPAL TUNNEL RELEASE     bilateral   CATARACT EXTRACTION     bilateral, /w IOL   CHOLECYSTECTOMY  1998   HEMORRHOID SURGERY     x 2   LUMBAR LAMINECTOMY     6 prior back surgery    RETINAL LASER PROCEDURE     bilateral   ROTATOR CUFF REPAIR     bilateral   SPINAL FUSION  08/22/2011   Procedure: FUSION POSTERIOR SPINAL MULTILEVEL/SCOLIOSIS;  Surgeon: GORMAN Ozell Mans;  Location: [facility] OR;  Service: Orthopedics;  Laterality: N/A;  LUMBAR LAMINECTOMY, EXTENSION OF POSTERIOR LUMBAR FUSION TO T10 WITH K2M RODS, SCREWS, CONNECTORS, HOOKS, ILIAC CREST BONE GRAFT   TONSILLECTOMY     TOTAL KNEE ARTHROPLASTY     left   VAGINAL HYSTERECTOMY  1990's   Social History:  reports that she has never smoked. She has never used smokeless tobacco. She reports that she does not drink alcohol  and does not use drugs.  Allergies  Allergen Reactions   Celebrex [Celecoxib]  Swelling   Morphine Sulfate Nausea Only   Percocet [Oxycodone-Acetaminophen ] Itching   Robinul  [Glycopyrrolate ] Rash    Family History  Problem Relation Age of Onset   Diabetes Mother    Cancer Mother        mother   Diabetes Paternal Aunt        x 1   Diabetes Paternal Uncle        x 3   Diabetes Brother    Diabetes Other        neice   Heart disease Father    Cancer Father        gallbladder   Liver cancer Father    Heart disease Brother    Colon polyps Sister    Colon cancer Neg Hx    Anesthesia problems Neg Hx    Hypotension Neg Hx    Malignant hyperthermia Neg Hx    Pseudochol deficiency Neg Hx      Prior to Admission medications   Medication Sig Start Date End Date Taking?  Authorizing Provider  acetaminophen  (8 HR ARTHRITIS PAIN RELIEF) 650 MG CR tablet Take 650 mg by mouth as needed for pain.    [provider]  allopurinol  (ZYLOPRIM ) 300 MG tablet Take 300 mg by mouth daily.    [provider]  amLODipine  (NORVASC ) 5 MG tablet Take 5 mg by mouth daily.      [provider]  aspirin  EC 81 MG tablet Take 81 mg by mouth daily.    [provider]  B Complex Vitamins (VITAMIN-B COMPLEX PO) Take 1 tablet by mouth daily in the afternoon.    [provider]  Calcium  Carb-Cholecalciferol (CALCIUM  PLUS VITAMIN D3) 600-20 MG-MCG TABS Take 1 tablet by mouth daily in the afternoon.    [provider]  diclofenac  sodium (VOLTAREN ) 1 % GEL Apply 2 g topically 4 (four) times daily. Patient taking differently: Apply 2 g topically as needed. 07/02/18   Vernetta Lonni GRADE, MD  escitalopram  (LEXAPRO ) 5 MG tablet Take 5 mg by mouth daily. 08/31/22   [provider]  gabapentin (NEURONTIN) 100 MG capsule Take 100 mg by mouth daily. Patient not taking: Reported on 04/20/2023 04/19/23   [provider]  glimepiride  (AMARYL ) 4 MG tablet Take 4 mg by mouth 2 (two) times daily.    [provider]  HYDROcodone -acetaminophen  (NORCO/VICODIN) 5-325 MG tablet Take 1-2 tablets by mouth every 6 (six) hours as needed for moderate pain. 07/02/18   Vernetta Lonni GRADE, MD  hydrOXYzine  (ATARAX /VISTARIL ) 50 MG tablet Take 1 tablet (50 mg total) by mouth 3 (three) times daily as needed. 01/02/17   Aneita Gwendlyn DASEN, MD  hyoscyamine  (LEVSIN  SL) 0.125 MG SL tablet Place 1 tablet (0.125 mg total) under the tongue every 6 (six) hours as needed. 04/20/23   Beather Delon Gibson, PA  LORazepam  (ATIVAN ) 0.5 MG tablet Take 1 tablet by mouth as needed. 10/10/16   [provider]  metFORMIN  (GLUCOPHAGE ) 1000 MG tablet Take 1,000 mg by mouth 2 (two) times daily. 02/21/16   [provider]  methocarbamol  (ROBAXIN ) 500 MG  tablet Take 1 tablet (500 mg total) by mouth 3 (three) times daily as needed (muscle spasm/pain). 03/07/22   Steinl, Kevin, MD  omeprazole  (PRILOSEC) 40 MG capsule TAKE 1 CAPSULE BY MOUTH DAILY 10/15/23   Aneita Gwendlyn DASEN, MD  ramipril  (ALTACE ) 10 MG capsule Take 10 mg by mouth daily.      [provider]    Physical Exam:  BP (!) 155/73   Pulse 70   Temp 97.7 F (36.5 C) (Oral)   Resp 19   Ht 5' 1 (1.549 m)   Wt 64.4 kg   SpO2 97%   BMI 26.83 kg/m  General:  Alert, oriented, calm, in no acute distress, elderly female who appears younger than Neck: supple, no masses, trachea mildline  Cardiovascular: RRR, no murmurs or rubs, no peripheral edema  Respiratory: clear to auscultation bilaterally, no wheezes, no crackles  Abdomen: soft, nontender, nondistended, normal bowel tones heard  Skin: dry, no rashes  Musculoskeletal: no joint effusions, normal range of motion  Psychiatric: appropriate affect, normal speech  Neurologic: extraocular muscles intact, clear speech, moving all extremities with intact sensorium         Labs on Admission:  Basic Metabolic Panel: Recent Labs  Lab 04/30/24 1012 04/30/24 1027  NA 139 141  K 4.1 4.1  CL 103 104  CO2 23  --   GLUCOSE 141* 137*  BUN 25* 23  CREATININE 1.06* 1.10*  CALCIUM  9.3  --    Liver Function Tests: Recent Labs  Lab 04/30/24 1012  AST 27  ALT 17  ALKPHOS 77  BILITOT 0.6  PROT 7.6  ALBUMIN 4.2   No results for input(s): LIPASE, AMYLASE in the last 168 hours. No results for input(s): AMMONIA in the last 168 hours. CBC: Recent Labs  Lab 04/30/24 1012 04/30/24 1027  WBC 9.2  --   NEUTROABS 6.2  --   HGB 11.2* 11.9*  HCT 35.3* 35.0*  MCV 93.9  --   PLT 252  --    Cardiac Enzymes: No results for input(s): CKTOTAL, CKMB, CKMBINDEX, TROPONINI in the last 168 hours. BNP (last 3 results) No results for input(s): BNP in the last 8760 hours.  ProBNP (last 3 results) No results for  input(s): PROBNP in the last 8760 hours.  CBG: No results for input(s): GLUCAP in the last 168 hours.  Radiological Exams on Admission: MR BRAIN WO CONTRAST Result Date: 04/30/2024 CLINICAL DATA:  89 year old female with dizziness and vertigo. EXAM: MRI HEAD WITHOUT CONTRAST TECHNIQUE: Multiplanar, multiecho pulse sequences of the brain and surrounding structures were obtained without intravenous contrast. COMPARISON:  Head CT earlier today.  Brain MRI 10/21/2013. FINDINGS: Brain: No restricted diffusion to suggest acute infarction. No midline shift, mass effect, evidence of mass lesion, ventriculomegaly, extra-axial collection or acute intracranial hemorrhage. Cervicomedullary junction and pituitary are within normal limits. Largely stable since 2015 and normal for age gray and white matter signal in the brain. No cortical encephalomalacia or chronic cerebral blood products identified. Vascular: Major intracranial vascular flow voids are preserved, stable since 2015. Skull and upper cervical spine: Normal for age visible cervical spine, bone marrow signal. Sinuses/Orbits: Chronic left sphenoid sinus disease. Otherwise negative. Other: Trace bilateral mastoid air cell fluid is stable or regressed since 2015. Otherwise grossly normal visible internal auditory structures. Negative visible scalp and face. IMPRESSION: No acute intracranial abnormality. Negative for age noncontrast MRI appearance of the brain. Electronically Signed   By: VEAR Hurst M.D.   On: 04/30/2024 11:37   CT Head Wo Contrast Result Date: 04/30/2024 CLINICAL DATA:  Provided history: Vertigo, central.  Dizziness. EXAM: CT HEAD WITHOUT CONTRAST TECHNIQUE: Contiguous axial images were obtained from the base of the skull through the vertex without intravenous contrast. RADIATION DOSE REDUCTION: This exam was performed according to the departmental dose-optimization program which includes automated exposure control, adjustment of the mA and/or  kV according to patient  size and/or use of iterative reconstruction technique. COMPARISON:  Brain MRI 08/21/2014.  Head CT 08/20/2014. FINDINGS: Brain: Moderate-to-advanced generalized cerebral atrophy. There is no acute intracranial hemorrhage. No demarcated cortical infarct. No extra-axial fluid collection. No evidence of an intracranial mass. No midline shift. Vascular: No hyperdense vessel.  Atherosclerotic calcifications. Skull: No calvarial fracture or aggressive osseous lesion. Sinuses/Orbits: No mass or acute finding within the imaged orbits. Minimal mucosal thickening within the bilateral maxillary sinuses at the imaged levels. Severe mucosal thickening within the left sphenoid sinus (with associated chronic reactive osteitis). IMPRESSION: 1.  No evidence of an acute intracranial abnormality. 2. Moderate-to-advanced generalized cerebral atrophy. 3. Paranasal sinus disease at the imaged levels, as described. Electronically Signed   By: Rockey Childs D.O.   On: 04/30/2024 09:43   DG Chest Port 1 View Result Date: 04/30/2024 CLINICAL DATA:  Weakness. EXAM: PORTABLE CHEST 1 VIEW COMPARISON:  03/07/2022. FINDINGS: Low lung volume. Moderately elevated right hemidiaphragm. Bilateral lung fields are clear. Bilateral costophrenic angles are clear. Stable cardio-mediastinal silhouette. No acute osseous abnormalities. Thoracolumbar spinal fixation hardware seen. The soft tissues are within normal limits. IMPRESSION: No active disease. Electronically Signed   By: Ree Molt M.D.   On: 04/30/2024 09:37   Assessment/Plan JENAVEVE FENSTERMAKER is a 89 y.o. female with medical history significant for type 2 diabetes, hypertension, GERD being admitted to the hospital with dizziness.  Dizziness-unclear etiology, what she describes is more dizziness rather than vertigo.  No significant electrolyte abnormality, MRI brain is unremarkable.  No clear evidence of acute bacterial or viral infection.  Blood pressure is  currently elevated, and she has had no recent medication changes.  However patient describes that she may have some relative dehydration as she does not drink much fluids at all, and potentially she has some orthostasis. -Observation admission -Monitor on telemetry -Gentle IV fluids -Check orthostatic vital signs every shift -PT evaluation -Meclizine  as needed for dizziness/vertigo  Hypertension-will plan to continue home medications once reconciled, and once orthostatics are checked  Type 2 diabetes-carb modified diet, with moderate dose sliding scale insulin   Peripheral neuropathy-patient has recently discontinued gabapentin due to perceived GI side effects  DVT prophylaxis: Lovenox      Code Status: Full Code  Consults called: None  Admission status: Observation  Time spent: 49 minutes  Jerzi Tigert CHRISTELLA Gail MD [facility] Pager [phone]  If 7PM-7AM, please contact night-coverage [email or web address] Password [facility]  04/30/2024, 3:31 PM

## 2024-04-30 NOTE — ED Triage Notes (Signed)
 Pt BIB [facility] from home co dizziness. No hx of vertigo. No falls, no loc. Baseline AO4 Ambulates w cane. Took 2 'normal' aspirin - not baby aspirin . Hx of Diabetes, htn.   EMS vitals  76 hr, 95 Spo2, 95 CGB, 150/56 BP  Did not take bp meds this morning

## 2024-04-30 NOTE — ED Provider Notes (Signed)
 [place] EMERGENCY DEPARTMENT AT [facility] Provider Note   CSN: [id number] Arrival date & time: 04/30/24  9143     Patient presents with: Dizziness   [name] is a 89 y.o. female.   89 year old female with prior medical history including diabetes, hypertension presents with complaint of dizziness.  Patient reports that she woke up up this morning with symptoms.  She reports that she went to bed last night without difficulty or complaint.  This morning when she woke up she was able to get to her bathroom before she realized that she is very unsteady on her feet.  She had to hold onto the wall in order to keep from falling.  She managed to make it to her kitchen after using the bathroom and make some coffee.  Her symptoms did not improve and she decided to come to the ED by EMS.  Patient denies prior history of vertigo or unsteady gait.   She uses a cane to ambulate at baseline.  She denies focal weakness.  She denies visual change.  She denies pain.   The history is provided by the patient and medical records.       Prior to Admission medications   Medication Sig Start Date End Date Taking? Authorizing Provider  acetaminophen  (8 HR ARTHRITIS PAIN RELIEF) 650 MG CR tablet Take 650 mg by mouth as needed for pain.    [provider]  allopurinol  (ZYLOPRIM ) 300 MG tablet Take 300 mg by mouth daily.    [provider]  amLODipine  (NORVASC ) 5 MG tablet Take 5 mg by mouth daily.      [provider]  aspirin  EC 81 MG tablet Take 81 mg by mouth daily.    [provider]  B Complex Vitamins (VITAMIN-B COMPLEX PO) Take 1 tablet by mouth daily in the afternoon.    [provider]  Calcium  Carb-Cholecalciferol (CALCIUM  PLUS VITAMIN D3) 600-20 MG-MCG TABS Take 1 tablet by mouth daily in the afternoon.    [provider]  diclofenac  sodium (VOLTAREN ) 1 % GEL Apply 2 g topically 4 (four) times daily. Patient taking  differently: Apply 2 g topically as needed. 07/02/18   Vernetta Lonni GRADE, MD  escitalopram  (LEXAPRO ) 5 MG tablet Take 5 mg by mouth daily. 08/31/22   [provider]  gabapentin (NEURONTIN) 100 MG capsule Take 100 mg by mouth daily. Patient not taking: Reported on 04/20/2023 04/19/23   [provider]  glimepiride  (AMARYL ) 4 MG tablet Take 4 mg by mouth 2 (two) times daily.    [provider]  HYDROcodone -acetaminophen  (NORCO/VICODIN) 5-325 MG tablet Take 1-2 tablets by mouth every 6 (six) hours as needed for moderate pain. 07/02/18   Vernetta Lonni GRADE, MD  hydrOXYzine  (ATARAX /VISTARIL ) 50 MG tablet Take 1 tablet (50 mg total) by mouth 3 (three) times daily as needed. 01/02/17   Aneita Gwendlyn DASEN, MD  hyoscyamine  (LEVSIN  SL) 0.125 MG SL tablet Place 1 tablet (0.125 mg total) under the tongue every 6 (six) hours as needed. 04/20/23   Beather Delon Gibson, PA  LORazepam  (ATIVAN ) 0.5 MG tablet Take 1 tablet by mouth as needed. 10/10/16   [provider]  metFORMIN  (GLUCOPHAGE ) 1000 MG tablet Take 1,000 mg by mouth 2 (two) times daily. 02/21/16   [provider]  methocarbamol  (ROBAXIN ) 500 MG tablet Take 1 tablet (500 mg total) by mouth 3 (three) times daily as needed (muscle spasm/pain). 03/07/22   Steinl, Kevin, MD  omeprazole  [facility])  40 MG capsule TAKE 1 CAPSULE BY MOUTH DAILY 10/15/23   Aneita Gwendlyn DASEN, MD  ramipril  (ALTACE ) 10 MG capsule Take 10 mg by mouth daily.      [provider]    Allergies: Celebrex [celecoxib], Morphine sulfate, Percocet [oxycodone-acetaminophen ], and Robinul  [glycopyrrolate ]    Review of Systems  All other systems reviewed and are negative.   Updated Vital Signs BP (!) 197/62 (BP Location: Right Arm)   Pulse 77   Temp 98.1 F (36.7 C) (Oral)   Resp 19   Ht 5' 1 (1.549 m)   Wt 64.4 kg   SpO2 98%   BMI 26.83 kg/m   Physical Exam Vitals and nursing note reviewed.  Constitutional:      General:  She is not in acute distress.    Appearance: Normal appearance. She is well-developed.  HENT:     Head: Normocephalic and atraumatic.  Eyes:     Conjunctiva/sclera: Conjunctivae normal.     Pupils: Pupils are equal, round, and reactive to light.  Cardiovascular:     Rate and Rhythm: Normal rate and regular rhythm.     Heart sounds: Normal heart sounds.  Pulmonary:     Effort: Pulmonary effort is normal. No respiratory distress.     Breath sounds: Normal breath sounds.  Abdominal:     General: There is no distension.     Palpations: Abdomen is soft.     Tenderness: There is no abdominal tenderness.  Musculoskeletal:        General: No deformity. Normal range of motion.     Cervical back: Normal range of motion and neck supple.  Skin:    General: Skin is warm and dry.  Neurological:     General: No focal deficit present.     Mental Status: She is alert and oriented to person, place, and time.     (all labs ordered are listed, but only abnormal results are displayed) Labs Reviewed  CBC WITH DIFFERENTIAL/PLATELET  COMPREHENSIVE METABOLIC PANEL WITH GFR  ETHANOL  URINALYSIS, W/ REFLEX TO CULTURE (INFECTION SUSPECTED)  I-STAT CHEM 8, ED  I-STAT CG4 LACTIC ACID, ED  TYPE AND SCREEN  TROPONIN I (HIGH SENSITIVITY)    EKG: EKG Interpretation Date/Time:  Wednesday April 30 2024 09:05:39 EDT Ventricular Rate:  75 PR Interval:  326 QRS Duration:  120 QT Interval:  397 QTC Calculation: 447 R Axis:   86  Text Interpretation: Sinus rhythm Prolonged PR interval Consider left atrial enlargement IVCD, consider atypical RBBB Minimal ST elevation, inferior leads Confirmed by Laurice Coy [phone]) on 04/30/2024 9:16:25 AM  Radiology: No results found.   Procedures   Medications Ordered in the ED - No data to display                                  Medical Decision Making Amount and/or Complexity of Data Reviewed Labs: ordered. Radiology: ordered.  Risk Prescription drug  management. Decision regarding hospitalization.    Medical Screen Complete  This patient presented to the ED with complaint of unsteady gait.  This complaint involves an extensive number of treatment options. The initial differential diagnosis includes, but is not limited to, vertigo, CVA, metabolic abnormality, infection, etc.  This presentation is: Acute, Chronic, Self-Limited, Previously Undiagnosed, Uncertain Prognosis, Complicated, Systemic Symptoms, and Threat to Life/Bodily Function Patient presents with complaint of unsteady gait.  She woke up with symptoms.  She reports that she  had to hold onto the wall in order to walk this morning.  She denies focal weakness or pain.  Workup demonstrates elevated lactic acid of 3.3.  White count is 9.2.  UA is concerning for possible infection.  IV fluids antibiotics administered.  CT head and MRI brain are without acute abnormality.  Patient feels improved after initial workup.  However, on ambulation trial she is very unsteady.  She cannot stand in a safe manner.  She is not appropriate for discharge home.  Patient would benefit from admission.  Hospitalist service made aware of case.   Additional history obtained:  External records from outside sources obtained and reviewed including prior ED visits and prior Inpatient records.    Problem List / ED Course:  Unsteady gait, vertigo   Reevaluation:  After the interventions noted above, I reevaluated the patient and found that they have: improved  Disposition:  After consideration of the diagnostic results and the patients response to treatment, I feel that the patent would benefit from admission.       Final diagnoses:  Vertigo    ED Discharge Orders     None          Laurice Maude BROCKS, MD 04/30/24 1517

## 2024-04-30 NOTE — Evaluation (Signed)
 Physical Therapy Evaluation Patient Details Name: [name] MRN: [id number] DOB: Dec 17, 1927 Today's Date: 04/30/2024  History of Present Illness  89 yo female presents to therapy following arrival to ED on 04/30/2024 due to reports of dizziness upon arising this am.  Imaging of head negative and CXR indicates low volume. Pt has elevated lactic acid levels and WBC with UA concerning for possible infection. Pt PMH includes but is not limited to: back surgeries, DM II, OA, L foot first ray amputaion, GERD, gout and HTN.  Clinical Impression     Pt admitted with above diagnosis.  Pt currently with functional limitations due to the deficits listed below (see PT Problem List). Pt in bed when PT arrived. Nursing staff present and pt recently transitioned from ED to room. Pt agreeable to therapy. Pt indicated sudden onset of dizziness today and attributes to poor hydration. Pt is S for bed mobility from flat surfaces supine <> sit, S for sit to stand  from EOB to RW, noted use of posterior B LE support on bed for stability with initial standing balance and pt able to progress to static standing close S, 1 UE at RW, gait assessment limited due to another care provider entering room 3 feet with Rw, CGA and min cues. Pt c/o of being heavy headed with positional changes, please see below for Bp findings.  Pt is motivated to return home and will benefit from [facility] services. Pt left in bed all needs in place and nursing staff present. Pt will benefit from acute skilled PT to increase their independence and safety with mobility to allow discharge.   Bp supine 153/68 (74 PR) Bp seated Eob 179/64 (75 PR) Bp immediate standing 183/65 (79 PR) S/p 3 min static standing at RW 171/66 (81 PR) O2 saturation 97% on RA      If plan is discharge home, recommend the following: A little help with walking and/or transfers;A little help with bathing/dressing/bathroom;Assistance with cooking/housework;Assist for  transportation;Help with stairs or ramp for entrance   Can travel by private vehicle        Equipment Recommendations None recommended by PT  Recommendations for Other Services       Functional Status Assessment Patient has had a recent decline in their functional status and demonstrates the ability to make significant improvements in function in a reasonable and predictable amount of time.     Precautions / Restrictions Precautions Precautions: Fall Restrictions Weight Bearing Restrictions Per Provider Order: No      Mobility  Bed Mobility Overal bed mobility: Needs Assistance Bed Mobility: Supine to Sit, Sit to Supine     Supine to sit: Supervision     General bed mobility comments: S min cues for initiation from flat surface    Transfers Overall transfer level: Needs assistance Equipment used: Rolling walker (2 wheels) Transfers: Sit to/from Stand Sit to Stand: Supervision           General transfer comment: min cues pull to stand    Ambulation/Gait Ambulation/Gait assistance: Contact guard assist Gait Distance (Feet): 3 Feet Assistive device: Rolling walker (2 wheels)   Gait velocity: decreased     General Gait Details: gait limited due to another care provider entering room, IV team. pt able to side step with RW and CGA toward HOB to the R no overt LOB and min cues  Stairs            Wheelchair Mobility     Tilt Bed  Modified Rankin (Stroke Patients Only)       Balance Overall balance assessment: Mild deficits observed, not formally tested, History of Falls                                           Pertinent Vitals/Pain Pain Assessment Pain Assessment: No/denies pain (no pain at eval, hx of chronic LBP s/p surgeries (7))    Home Living Family/patient expects to be discharged to:: Private residence Living Arrangements: Alone Available Help at Discharge: Family Type of Home: House Home Access: Stairs to  enter Entrance Stairs-Rails: Right Entrance Stairs-Number of Steps: 6   Home Layout: One level;Laundry or work area in [employer] Equipment: Agricultural consultant (2 wheels);Cane - single point;Rollator (4 wheels);Shower seat;BSC/3in1      Prior Function Prior Level of Function : History of Falls (last six months);Needs assist             Mobility Comments: pt reports mod I with SPC for all ADLs, self care tasks ADLs Comments: pt reports sister assist with IADLs     Extremity/Trunk Assessment        Lower Extremity Assessment Lower Extremity Assessment: Overall WFL for tasks assessed    Cervical / Trunk Assessment Cervical / Trunk Assessment: Back Surgery  Communication   Communication Communication: No apparent difficulties    Cognition Arousal: Alert Behavior During Therapy: WFL for tasks assessed/performed   PT - Cognitive impairments: No apparent impairments                         Following commands: Intact       Cueing       General Comments      Exercises     Assessment/Plan    PT Assessment Patient needs continued PT services  PT Problem List Decreased activity tolerance;Decreased balance;Decreased mobility       PT Treatment Interventions DME instruction;Gait training;Stair training;Functional mobility training;Therapeutic activities;Therapeutic exercise;Balance training;Cognitive remediation    PT Goals (Current goals can be found in the Care Plan section)  Acute Rehab PT Goals Patient Stated Goal: to go home PT Goal Formulation: With patient Time For Goal Achievement: 05/14/24 Potential to Achieve Goals: Good    Frequency Min 3X/week     Co-evaluation               AM-PAC PT 6 Clicks Mobility  Outcome Measure Help needed turning from your back to your side while in a flat bed without using bedrails?: None Help needed moving from lying on your back to sitting on the side of a flat bed without using bedrails?: A  Little Help needed moving to and from a bed to a chair (including a wheelchair)?: A Little Help needed standing up from a chair using your arms (e.g., wheelchair or bedside chair)?: A Little Help needed to walk in hospital room?: A Little Help needed climbing 3-5 steps with a railing? : A Little 6 Click Score: 19    End of Session Equipment Utilized During Treatment: Gait belt Activity Tolerance: Patient tolerated treatment well Patient left: in bed;with call bell/phone within reach;with nursing/sitter in room Nurse Communication: Mobility status;Other (comment) (Bp findings) PT Visit Diagnosis: Unsteadiness on feet (R26.81);Other abnormalities of gait and mobility (R26.89)    Time: 8375-8348 PT Time Calculation (min) (ACUTE ONLY): 27 min   Charges:   PT Evaluation $  PT Eval Low Complexity: 1 Low PT Treatments $Therapeutic Activity: 8-22 mins PT General Charges $$ ACUTE PT VISIT: 1 Visit         [place], PT Acute Rehab   [place] VEAR Drone 04/30/2024, 5:33 PM

## 2024-05-01 DIAGNOSIS — Z79899 Other long term (current) drug therapy: Secondary | ICD-10-CM | POA: Diagnosis not present

## 2024-05-01 DIAGNOSIS — F32A Depression, unspecified: Secondary | ICD-10-CM | POA: Diagnosis present

## 2024-05-01 DIAGNOSIS — M109 Gout, unspecified: Secondary | ICD-10-CM | POA: Diagnosis present

## 2024-05-01 DIAGNOSIS — Z833 Family history of diabetes mellitus: Secondary | ICD-10-CM | POA: Diagnosis not present

## 2024-05-01 DIAGNOSIS — Z888 Allergy status to other drugs, medicaments and biological substances status: Secondary | ICD-10-CM | POA: Diagnosis not present

## 2024-05-01 DIAGNOSIS — Z7982 Long term (current) use of aspirin: Secondary | ICD-10-CM | POA: Diagnosis not present

## 2024-05-01 DIAGNOSIS — D649 Anemia, unspecified: Secondary | ICD-10-CM

## 2024-05-01 DIAGNOSIS — N3000 Acute cystitis without hematuria: Secondary | ICD-10-CM

## 2024-05-01 DIAGNOSIS — Z981 Arthrodesis status: Secondary | ICD-10-CM | POA: Diagnosis not present

## 2024-05-01 DIAGNOSIS — Z885 Allergy status to narcotic agent status: Secondary | ICD-10-CM | POA: Diagnosis not present

## 2024-05-01 DIAGNOSIS — N390 Urinary tract infection, site not specified: Secondary | ICD-10-CM | POA: Diagnosis present

## 2024-05-01 DIAGNOSIS — Z96652 Presence of left artificial knee joint: Secondary | ICD-10-CM | POA: Diagnosis present

## 2024-05-01 DIAGNOSIS — I10 Essential (primary) hypertension: Secondary | ICD-10-CM

## 2024-05-01 DIAGNOSIS — K219 Gastro-esophageal reflux disease without esophagitis: Secondary | ICD-10-CM | POA: Diagnosis present

## 2024-05-01 DIAGNOSIS — E1142 Type 2 diabetes mellitus with diabetic polyneuropathy: Secondary | ICD-10-CM | POA: Diagnosis present

## 2024-05-01 DIAGNOSIS — Z8249 Family history of ischemic heart disease and other diseases of the circulatory system: Secondary | ICD-10-CM | POA: Diagnosis not present

## 2024-05-01 DIAGNOSIS — Z89412 Acquired absence of left great toe: Secondary | ICD-10-CM | POA: Diagnosis not present

## 2024-05-01 DIAGNOSIS — B961 Klebsiella pneumoniae [K. pneumoniae] as the cause of diseases classified elsewhere: Secondary | ICD-10-CM | POA: Diagnosis present

## 2024-05-01 DIAGNOSIS — Z886 Allergy status to analgesic agent status: Secondary | ICD-10-CM | POA: Diagnosis not present

## 2024-05-01 DIAGNOSIS — Z880 Allergy status to penicillin: Secondary | ICD-10-CM | POA: Diagnosis not present

## 2024-05-01 DIAGNOSIS — Z7984 Long term (current) use of oral hypoglycemic drugs: Secondary | ICD-10-CM | POA: Diagnosis not present

## 2024-05-01 DIAGNOSIS — N179 Acute kidney failure, unspecified: Secondary | ICD-10-CM | POA: Diagnosis present

## 2024-05-01 DIAGNOSIS — Z85820 Personal history of malignant melanoma of skin: Secondary | ICD-10-CM | POA: Diagnosis not present

## 2024-05-01 DIAGNOSIS — E872 Acidosis, unspecified: Secondary | ICD-10-CM | POA: Diagnosis present

## 2024-05-01 DIAGNOSIS — R42 Dizziness and giddiness: Secondary | ICD-10-CM | POA: Diagnosis present

## 2024-05-01 DIAGNOSIS — Z860101 Personal history of adenomatous and serrated colon polyps: Secondary | ICD-10-CM | POA: Diagnosis not present

## 2024-05-01 LAB — BASIC METABOLIC PANEL WITH GFR
Anion gap: 9 (ref 5–15)
BUN: 18 mg/dL (ref 8–23)
CO2: 26 mmol/L (ref 22–32)
Calcium: 9.6 mg/dL (ref 8.9–10.3)
Chloride: 106 mmol/L (ref 98–111)
Creatinine, Ser: 0.8 mg/dL (ref 0.44–1.00)
GFR, Estimated: >60 mL/min (ref >60)
Glucose, Bld: 95 mg/dL (ref 70–99)
Potassium: 4.5 mmol/L (ref 3.5–5.1)
Sodium: 141 mmol/L (ref 135–145)

## 2024-05-01 LAB — CBC
HCT: 31.5 % — ABNORMAL LOW (ref 36.0–46.0)
Hemoglobin: 10.2 g/dL — ABNORMAL LOW (ref 12.0–15.0)
MCH: 31 pg (ref 26.0–34.0)
MCHC: 32.4 g/dL (ref 30.0–36.0)
MCV: 95.7 fL (ref 80.0–100.0)
Platelets: 228 K/uL (ref 150–400)
RBC: 3.29 MIL/uL — ABNORMAL LOW (ref 3.87–5.11)
RDW: 14.7 % (ref 11.5–15.5)
WBC: 8.2 K/uL (ref 4.0–10.5)
nRBC: 0 % (ref 0.0–0.2)

## 2024-05-01 LAB — GLUCOSE, CAPILLARY
Glucose-Capillary: 99 mg/dL (ref 70–99)
Glucose-Capillary: 174 mg/dL — ABNORMAL HIGH (ref 70–99)
Glucose-Capillary: 180 mg/dL — ABNORMAL HIGH (ref 70–99)

## 2024-05-01 LAB — LACTIC ACID, PLASMA: Lactic Acid, Venous: 1.8 mmol/L (ref 0.5–1.9)

## 2024-05-01 LAB — HEMOGLOBIN A1C
Hgb A1c MFr Bld: 6.9 % — ABNORMAL HIGH (ref 4.8–5.6)
Mean Plasma Glucose: 151 mg/dL

## 2024-05-01 MED ORDER — HYOSCYAMINE SULFATE 0.125 MG SL SUBL
0.1250 mg | SUBLINGUAL_TABLET | Freq: Four times a day (QID) | SUBLINGUAL | Status: DC | PRN
  Filled 2024-05-01: qty 1

## 2024-05-01 MED ORDER — HYDROCODONE-ACETAMINOPHEN 5-325 MG PO TABS: 1.0000 | ORAL_TABLET | Freq: Four times a day (QID) | ORAL | Status: DC | PRN

## 2024-05-01 MED ORDER — SODIUM CHLORIDE 0.9 % IV SOLN: INTRAVENOUS | Status: AC

## 2024-05-01 MED ORDER — AMLODIPINE BESYLATE 5 MG PO TABS
5.0000 mg | ORAL_TABLET | Freq: Every day | ORAL | Status: DC
  Administered 2024-05-01 – 2024-05-02 (×2): 5 mg via ORAL
  Filled 2024-05-01 (×2): qty 1

## 2024-05-01 MED ORDER — DICLOFENAC SODIUM 1 % EX GEL
2.0000 g | Freq: Four times a day (QID) | CUTANEOUS | Status: DC
  Administered 2024-05-01: 2 g via TOPICAL
  Filled 2024-05-01: qty 100

## 2024-05-01 MED ORDER — SODIUM CHLORIDE 0.9 % IV SOLN
1.0000 g | INTRAVENOUS | Status: DC
  Administered 2024-05-02: 1 g via INTRAVENOUS
  Filled 2024-05-01 (×2): qty 10

## 2024-05-01 MED ORDER — HYDRALAZINE HCL 25 MG PO TABS: 25.0000 mg | ORAL_TABLET | Freq: Three times a day (TID) | ORAL | Status: DC | PRN

## 2024-05-01 MED ORDER — LORAZEPAM 0.5 MG PO TABS: 0.5000 mg | ORAL_TABLET | Freq: Every day | ORAL | Status: DC | PRN

## 2024-05-01 MED ORDER — ASPIRIN 81 MG PO TBEC
81.0000 mg | DELAYED_RELEASE_TABLET | Freq: Every day | ORAL | Status: DC
  Administered 2024-05-01 – 2024-05-02 (×2): 81 mg via ORAL
  Filled 2024-05-01 (×2): qty 1

## 2024-05-01 MED ORDER — ESCITALOPRAM OXALATE 10 MG PO TABS: 5.0000 mg | ORAL_TABLET | Freq: Every day | ORAL | Status: DC

## 2024-05-01 MED ORDER — ENOXAPARIN SODIUM 40 MG/0.4ML IJ SOSY
40.0000 mg | PREFILLED_SYRINGE | INTRAMUSCULAR | Status: DC
  Administered 2024-05-01 – 2024-05-02 (×2): 40 mg via SUBCUTANEOUS
  Filled 2024-05-01 (×2): qty 0.4

## 2024-05-01 MED ORDER — PANTOPRAZOLE SODIUM 40 MG PO TBEC
40.0000 mg | DELAYED_RELEASE_TABLET | Freq: Every day | ORAL | Status: DC
  Administered 2024-05-01 – 2024-05-02 (×2): 40 mg via ORAL
  Filled 2024-05-01 (×2): qty 1

## 2024-05-01 MED ORDER — ALLOPURINOL 300 MG PO TABS
300.0000 mg | ORAL_TABLET | Freq: Every day | ORAL | Status: DC
  Administered 2024-05-01 – 2024-05-02 (×2): 300 mg via ORAL
  Filled 2024-05-01 (×2): qty 1

## 2024-05-01 MED ORDER — B COMPLEX-C PO TABS
ORAL_TABLET | Freq: Every day | ORAL | Status: DC
  Administered 2024-05-01 – 2024-05-02 (×2): 1 via ORAL
  Filled 2024-05-01 (×2): qty 1

## 2024-05-01 MED ORDER — ACETAMINOPHEN 325 MG PO TABS: 650.0000 mg | ORAL_TABLET | Freq: Four times a day (QID) | ORAL | Status: DC | PRN

## 2024-05-01 MED ORDER — HYDROXYZINE HCL 25 MG PO TABS: 50.0000 mg | ORAL_TABLET | Freq: Three times a day (TID) | ORAL | Status: DC | PRN

## 2024-05-01 MED ORDER — RAMIPRIL 5 MG PO CAPS
10.0000 mg | ORAL_CAPSULE | Freq: Every day | ORAL | Status: DC
  Administered 2024-05-01 – 2024-05-02 (×2): 10 mg via ORAL
  Filled 2024-05-01 (×2): qty 2

## 2024-05-01 NOTE — Progress Notes (Signed)
 [facility]                                                                               Margaree Sandhu, is a 89 y.o. female, DOB - 1928/01/20, FMW:[id number] Admit date - 04/30/2024    Outpatient Primary MD for the patient is Corlis Pagan, NP  LOS - 0  days    Brief summary   [name] is a 89 y.o. female with medical history significant for type 2 diabetes, hypertension, GERD being admitted to the hospital with dizziness.  History is provided by the patient, who lives on her own and is quite active although she walks with a cane.  States that over the past few months, she probably had some mild intermittent episodes of dizziness.  States that she probably does not drink enough water, her son and sister are always telling her that.  In any case, she was in her usual state of health  when she got up to use the bathroom.  She had severe dizziness, without associated palpitations, chest pain, or syncope.   CT head and MRI of the brain are negative for acute intracranial pathology.  She has moderate to advanced cerebral atrophy.   Assessment & Plan    Assessment and Plan:  Dizziness Probably secondary to dehydration and urinary tract infection Orthostatic vital signs are negative Vestibular evaluation is negative. Urine is abnormal and urine culture showed gram-negative bacteria Hydrate, continue with IV ceftriaxone . Patient was started on meclizine  for intermittent dizziness.   Essential hypertension Uncontrolled, Continue with Norvasc  5 mg daily ramipril  10 mg daily, add hydralazine  25 mg every 8 hours as needed.   Type 2 diabetes mellitus A1c 6.9 Continue with sliding scale insulin    Mild normocytic anemia Continue to monitor   Elevated lactic acid Lactic acid was 3.3 on admission and with IV fluids, lactic acid has improved to 1.8.  Mild AKI Creatinine at 1.1 on admission has improved to 0.8 with IV fluids. Recommend another day of IV fluids  and recheck labs in the morning  Estimated body mass index is 26.83 kg/m as calculated from the following:   Height as of this encounter: 5' 1 (1.549 m).   Weight as of this encounter: 64.4 kg.  Code Status: full code.  DVT Prophylaxis:  enoxaparin  (LOVENOX ) injection 40 mg Start: 05/01/24 1000   Level of Care: Level of care: Med-Surg Family Communication: sister at bedside.   Disposition Plan:     Remains inpatient appropriate:  pending.   Procedures:  None.   Consultants:   None.   Antimicrobials:   Anti-infectives (From admission, onward)    Start     Dose/Rate Route Frequency Ordered Stop   05/01/24 1000  cefTRIAXone  (ROCEPHIN ) 1 g in sodium chloride  0.9 % 100 mL IVPB        1 g 200 mL/hr over 30 Minutes Intravenous Every 24 hours 05/01/24 1214     04/30/24 1115  cefTRIAXone  (ROCEPHIN ) 1 g in sodium chloride  0.9 % 100 mL IVPB        1 g 200 mL/hr over 30 Minutes Intravenous  Once 04/30/24 1101 04/30/24 1234  Medications  Scheduled Meds:  allopurinol   300 mg Oral Daily   amLODipine   5 mg Oral Daily   aspirin  EC  81 mg Oral Daily   B-complex with vitamin C   Oral Daily   diclofenac  Sodium  2 g Topical QID   enoxaparin  (LOVENOX ) injection  40 mg Subcutaneous Q24H   insulin  aspart  0-15 Units Subcutaneous TID WC   pantoprazole   40 mg Oral Daily   ramipril   10 mg Oral Daily   Continuous Infusions:  sodium chloride  100 mL/hr at 05/01/24 1355   cefTRIAXone  (ROCEPHIN )  IV     PRN Meds:.acetaminophen , albuterol , HYDROcodone -acetaminophen , hydrOXYzine , hyoscyamine , LORazepam , meclizine , ondansetron  **OR** ondansetron  (ZOFRAN ) IV, traZODone     Subjective:   Crystle Carelli was seen and examined today. Pt upset and angry that she was lied to.  Still with some dizziness earlier this am.    Objective:   Vitals:   04/30/24 1957 05/01/24 0036 05/01/24 0400 05/01/24 1248  BP: (!) 157/54 (!) 160/61 (!) 155/54 (!) 187/64  Pulse: 71 69 63 76  Resp: 16 16  16 18   Temp: 98.2 F (36.8 C) 97.9 F (36.6 C) 98 F (36.7 C) (!) 97.5 F (36.4 C)  TempSrc:      SpO2: 97% 96% 97% 97%  Weight:      Height:        Intake/Output Summary (Last 24 hours) at 05/01/2024 1457 Last data filed at 04/30/2024 1750 Gross per 24 hour  Intake 1017.86 ml  Output --  Net 1017.86 ml   Filed Weights   04/30/24 0911  Weight: 64.4 kg     Exam General exam: elderly lady, not in distress,  Respiratory system: Clear to auscultation. Respiratory effort normal. Cardiovascular system: S1 & S2 heard, RRR. No JVD, Gastrointestinal system: Abdomen is nondistended, soft and nontender.  Central nervous system: Alert and oriented. No focal neurological deficits. Extremities: Symmetric 5 x 5 power. Skin: No rashes Psychiatry: Mood & affect appropriate.    Data Reviewed:  I have personally reviewed following labs and imaging studies   CBC Lab Results  Component Value Date   WBC 8.2 05/01/2024   RBC 3.29 (L) 05/01/2024   HGB 10.2 (L) 05/01/2024   HCT 31.5 (L) 05/01/2024   MCV 95.7 05/01/2024   MCH 31.0 05/01/2024   PLT 228 05/01/2024   MCHC 32.4 05/01/2024   RDW 14.7 05/01/2024   LYMPHSABS 2.3 04/30/2024   MONOABS 0.6 04/30/2024   EOSABS 0.0 04/30/2024   BASOSABS 0.1 04/30/2024     Last metabolic panel Lab Results  Component Value Date   NA 141 05/01/2024   K 4.5 05/01/2024   CL 106 05/01/2024   CO2 26 05/01/2024   BUN 18 05/01/2024   CREATININE 0.80 05/01/2024   GLUCOSE 95 05/01/2024   GFRNONAA >60 05/01/2024   GFRAA 42 (L) 08/14/2017   CALCIUM  9.6 05/01/2024   PHOS 3.3 04/04/2016   PROT 7.6 04/30/2024   ALBUMIN 4.2 04/30/2024   BILITOT 0.6 04/30/2024   ALKPHOS 77 04/30/2024   AST 27 04/30/2024   ALT 17 04/30/2024   ANIONGAP 9 05/01/2024    CBG (last 3)  Recent Labs    04/30/24 1739 04/30/24 2122 05/01/24 0751  GLUCAP 182* 135* 99      Coagulation Profile: No results for input(s): INR, PROTIME in the last 168  hours.   Radiology Studies: MR BRAIN WO CONTRAST Result Date: 04/30/2024 CLINICAL DATA:  89 year old female with dizziness and vertigo. EXAM: MRI HEAD  WITHOUT CONTRAST TECHNIQUE: Multiplanar, multiecho pulse sequences of the brain and surrounding structures were obtained without intravenous contrast. COMPARISON:  Head CT earlier today.  Brain MRI 10/21/2013. FINDINGS: Brain: No restricted diffusion to suggest acute infarction. No midline shift, mass effect, evidence of mass lesion, ventriculomegaly, extra-axial collection or acute intracranial hemorrhage. Cervicomedullary junction and pituitary are within normal limits. Largely stable since 2015 and normal for age gray and white matter signal in the brain. No cortical encephalomalacia or chronic cerebral blood products identified. Vascular: Major intracranial vascular flow voids are preserved, stable since 2015. Skull and upper cervical spine: Normal for age visible cervical spine, bone marrow signal. Sinuses/Orbits: Chronic left sphenoid sinus disease. Otherwise negative. Other: Trace bilateral mastoid air cell fluid is stable or regressed since 2015. Otherwise grossly normal visible internal auditory structures. Negative visible scalp and face. IMPRESSION: No acute intracranial abnormality. Negative for age noncontrast MRI appearance of the brain. Electronically Signed   By: VEAR Hurst M.D.   On: 04/30/2024 11:37   CT Head Wo Contrast Result Date: 04/30/2024 CLINICAL DATA:  Provided history: Vertigo, central.  Dizziness. EXAM: CT HEAD WITHOUT CONTRAST TECHNIQUE: Contiguous axial images were obtained from the base of the skull through the vertex without intravenous contrast. RADIATION DOSE REDUCTION: This exam was performed according to the departmental dose-optimization program which includes automated exposure control, adjustment of the mA and/or kV according to patient size and/or use of iterative reconstruction technique. COMPARISON:  Brain MRI 08/21/2014.   Head CT 08/20/2014. FINDINGS: Brain: Moderate-to-advanced generalized cerebral atrophy. There is no acute intracranial hemorrhage. No demarcated cortical infarct. No extra-axial fluid collection. No evidence of an intracranial mass. No midline shift. Vascular: No hyperdense vessel.  Atherosclerotic calcifications. Skull: No calvarial fracture or aggressive osseous lesion. Sinuses/Orbits: No mass or acute finding within the imaged orbits. Minimal mucosal thickening within the bilateral maxillary sinuses at the imaged levels. Severe mucosal thickening within the left sphenoid sinus (with associated chronic reactive osteitis). IMPRESSION: 1.  No evidence of an acute intracranial abnormality. 2. Moderate-to-advanced generalized cerebral atrophy. 3. Paranasal sinus disease at the imaged levels, as described. Electronically Signed   By: Rockey Childs D.O.   On: 04/30/2024 09:43   DG Chest Port 1 View Result Date: 04/30/2024 CLINICAL DATA:  Weakness. EXAM: PORTABLE CHEST 1 VIEW COMPARISON:  03/07/2022. FINDINGS: Low lung volume. Moderately elevated right hemidiaphragm. Bilateral lung fields are clear. Bilateral costophrenic angles are clear. Stable cardio-mediastinal silhouette. No acute osseous abnormalities. Thoracolumbar spinal fixation hardware seen. The soft tissues are within normal limits. IMPRESSION: No active disease. Electronically Signed   By: Ree Molt M.D.   On: 04/30/2024 09:37       Elgie Butter M.D. [facility] 05/01/2024, 2:57 PM  Available via Epic secure chat 7am-7pm After 7 pm, please refer to night coverage provider listed on amion.

## 2024-05-01 NOTE — Progress Notes (Signed)
 Physical Therapy Treatment Patient Details Name: [name] MRN: [id number] DOB: May 04, 1928 Today's Date: 05/01/2024   History of Present Illness 89 yo female presents to therapy following arrival to ED on 04/30/2024 due to reports of dizziness upon arising this am.  Imaging of head negative and CXR indicates low volume. Pt has elevated lactic acid levels and WBC with UA concerning for possible infection. Pt PMH includes but is not limited to: back surgeries, DM II, OA, L foot first ray amputaion, GERD, gout and HTN.    PT Comments  Pt exhibiting symptoms of dizziness, PT consulted for consideration of vestibular involvement. Pt agreeable to session, apprehensive initially, but appreciative of time and session. Pt has evidence of mild saccadic eye movements with smooth pursuit testing to the L. Negative for nystagmus with smooth pursuit, epley testing bilaterally. She is able to complete saccades testing without the onset of symptoms, some difficulty with target locking, moreso with L/R gaze, supervision-inf normal. Pt has limitations in her cervical spine with end range stiffness during rotation ROM, side bending is limited bilaterally L>R, and pt endorses stretch to contralateral upper traps. BP assessed (see below). Symptoms are likely cervicogenic in nature, with potential for vestibular involvement in addition. Pt completes amb in the hallway with RW and SPC with no LOB and good cadence, able to complete directional changes, no onset of dizziness with amb. One brief bout of dizziness when standing prior to amb, abolishes <10s.   BP- Sitting: 159/52 69bpm Stand 170/72 87bpm +3 mins: 180/57 82bpm   If plan is discharge home, recommend the following: A little help with walking and/or transfers;A little help with bathing/dressing/bathroom;Assistance with cooking/housework;Assist for transportation;Help with stairs or ramp for entrance   Can travel by private vehicle        Equipment  Recommendations  Cane [facility])    Recommendations for Other Services       Precautions / Restrictions Precautions Precautions: Fall Recall of Precautions/Restrictions: Intact Restrictions Weight Bearing Restrictions Per Provider Order: No     Mobility  Bed Mobility Overal bed mobility: Needs Assistance Bed Mobility: Supine to Sit, Sit to Supine     Supine to sit: Modified independent (Device/Increase time) Sit to supine: Modified independent (Device/Increase time)        Transfers Overall transfer level: Needs assistance Equipment used: Rolling walker (2 wheels) Transfers: Sit to/from Stand Sit to Stand: Supervision           General transfer comment: cues to gain balance in standing    Ambulation/Gait Ambulation/Gait assistance: Contact guard assist Gait Distance (Feet): 50 Feet Assistive device: Rolling walker (2 wheels), Straight cane Gait Pattern/deviations: Step-through pattern, Decreased stride length Gait velocity: WFL Gait velocity interpretation: >2.62 ft/sec, indicative of community ambulatory   General Gait Details: Pt completes 2 bouts of amb, one with RW and one with SPC. She takes short strides, but has inc cadence with both of these devices, evident that [facility] is primary AD at home as she is able to demonstrate appropriate use, even with the frequency of steps in the presence of dec stride length. no LOB noted.   Stairs             Wheelchair Mobility     Tilt Bed    Modified Rankin (Stroke Patients Only)       Balance Overall balance assessment: Needs assistance Sitting-balance support: Feet supported Sitting balance-Leahy Scale: Normal     Standing balance support: Reliant on assistive device for balance, During functional  activity Standing balance-Leahy Scale: Good                              Communication Communication Communication: No apparent difficulties  Cognition Arousal: Alert Behavior During Therapy:  WFL for tasks assessed/performed   PT - Cognitive impairments: No apparent impairments                         Following commands: Intact      Cueing    Exercises      General Comments General comments (skin integrity, edema, etc.): Limitations in c/s, orthos assessed (modified)      Pertinent Vitals/Pain Pain Assessment Pain Assessment: Faces Faces Pain Scale: Hurts a little bit Pain Location: low back Pain Descriptors / Indicators: Aching, Discomfort Pain Intervention(s): Limited activity within patient's tolerance, Monitored during session    Home Living                          Prior Function            PT Goals (current goals can now be found in the care plan section) Acute Rehab PT Goals Patient Stated Goal: to go home PT Goal Formulation: With patient Time For Goal Achievement: 05/14/24 Potential to Achieve Goals: Good Progress towards PT goals: Progressing toward goals    Frequency    Min 3X/week      PT Plan      Co-evaluation              AM-PAC PT 6 Clicks Mobility   Outcome Measure  Help needed turning from your back to your side while in a flat bed without using bedrails?: None Help needed moving from lying on your back to sitting on the side of a flat bed without using bedrails?: None Help needed moving to and from a bed to a chair (including a wheelchair)?: A Little Help needed standing up from a chair using your arms (e.g., wheelchair or bedside chair)?: A Little Help needed to walk in hospital room?: A Little Help needed climbing 3-5 steps with a railing? : A Little 6 Click Score: 20    End of Session Equipment Utilized During Treatment: Gait belt Activity Tolerance: Patient tolerated treatment well Patient left: in bed;with call bell/phone within reach;with nursing/sitter in room;with family/visitor present Nurse Communication: Mobility status;Other (comment) (BP) PT Visit Diagnosis: Unsteadiness on feet  (R26.81);Other abnormalities of gait and mobility (R26.89);Other symptoms and signs involving the nervous system (R29.898)     Time: 9144-9061 PT Time Calculation (min) (ACUTE ONLY): 43 min  Charges:    $Gait Training: 8-22 mins $Therapeutic Activity: 23-37 mins PT General Charges $$ ACUTE PT VISIT: 1 Visit                     Stann, PT Acute Rehabilitation Services Office: [phone] 05/01/2024    Stann DELENA Ohara 05/01/2024, 9:50 AM

## 2024-05-01 NOTE — Plan of Care (Signed)
  Problem: Fluid Volume: Goal: Ability to maintain a balanced intake and output will improve Outcome: Progressing   Problem: Health Behavior/Discharge Planning: Goal: Ability to manage health-related needs will improve Outcome: Progressing   Problem: Metabolic: Goal: Ability to maintain appropriate glucose levels will improve Outcome: Progressing   Problem: Skin Integrity: Goal: Risk for impaired skin integrity will decrease Outcome: Progressing

## 2024-05-02 DIAGNOSIS — I10 Essential (primary) hypertension: Secondary | ICD-10-CM | POA: Diagnosis not present

## 2024-05-02 DIAGNOSIS — N3000 Acute cystitis without hematuria: Secondary | ICD-10-CM | POA: Diagnosis not present

## 2024-05-02 DIAGNOSIS — R42 Dizziness and giddiness: Secondary | ICD-10-CM | POA: Diagnosis not present

## 2024-05-02 LAB — GLUCOSE, CAPILLARY
Glucose-Capillary: 182 mg/dL — ABNORMAL HIGH (ref 70–99)
Glucose-Capillary: 175 mg/dL — ABNORMAL HIGH (ref 70–99)

## 2024-05-02 LAB — URINE CULTURE: Culture: >=100000 — AB

## 2024-05-02 MED ORDER — CEPHALEXIN 500 MG PO CAPS: 500.0000 mg | ORAL_CAPSULE | Freq: Three times a day (TID) | ORAL | 0 refills | Status: AC

## 2024-05-02 MED ORDER — GLIMEPIRIDE 4 MG PO TABS: 2.0000 mg | ORAL_TABLET | Freq: Two times a day (BID) | ORAL | 1 refills | Status: DC

## 2024-05-02 NOTE — Plan of Care (Signed)
  Problem: Education: Goal: Ability to describe self-care measures that may prevent or decrease complications (Diabetes Survival Skills Education) will improve Outcome: Progressing   Problem: Coping: Goal: Ability to adjust to condition or change in health will improve Outcome: Progressing   Problem: Nutritional: Goal: Maintenance of adequate nutrition will improve Outcome: Progressing   Problem: Skin Integrity: Goal: Risk for impaired skin integrity will decrease Outcome: Progressing   Problem: Activity: Goal: Risk for activity intolerance will decrease Outcome: Progressing   Problem: Nutrition: Goal: Adequate nutrition will be maintained Outcome: Progressing   Problem: Coping: Goal: Level of anxiety will decrease Outcome: Progressing

## 2024-05-02 NOTE — Discharge Summary (Signed)
 Physician Discharge Summary   Patient: [name] MRN: [id number] DOB: 1928/06/26  Admit date:     04/30/2024  Discharge date: 05/02/24  Discharge Physician: Elgie Butter   PCP: Corlis Pagan, NP   Recommendations at discharge:  Please follow up with PCp in one week.  Discharge Diagnoses: Principal Problem:   Dizziness Active Problems:   UTI (urinary tract infection)    Hospital Course: [name] is a 89 y.o. female with medical history significant for type 2 diabetes, hypertension, GERD being admitted to the hospital with dizziness.  History is provided by the patient, who lives on her own and is quite active although she walks with a cane.  States that over the past few months, she probably had some mild intermittent episodes of dizziness.  States that she probably does not drink enough water, her son and sister are always telling her that.  In any case, she was in her usual state of health  when she got up to use the bathroom.  She had severe dizziness, without associated palpitations, chest pain, or syncope.   CT head and MRI of the brain are negative for acute intracranial pathology.  She has moderate to advanced cerebral atrophy. Assessment and Plan:    Dizziness Probably secondary to dehydration and urinary tract infection vs hypoglycemia from glimipiride.  Orthostatic vital signs are negative Vestibular evaluation is negative. Urine is abnormal and urine culture showed klebsiella.  She reports dizziness has resolved.     Essential hypertension Well controlled.      Type 2 diabetes mellitus A1c 6.9 Hold glimipiride as she reports episodes of dizziness.  Suspect hypoglycemia.      Mild normocytic anemia Continue to monitor     Elevated lactic acid Lactic acid was 3.3 on admission and with IV fluids, lactic acid has improved to 1.8.   Mild AKI Creatinine at 1.1 on admission has improved to 0.8 with IV fluids. Recommend another day of IV fluids and  recheck labs in the morning show improvement.    Estimated body mass index is 26.83 kg/m as calculated from the following:   Height as of this encounter: 5' 1 (1.549 m).   Weight as of this encounter: 64.4 kg.    Consultants:  Procedures performed:  Disposition: Home Diet recommendation:  Discharge Diet Orders (From admission, onward)     Start     Ordered   05/02/24 0000  Diet - low sodium heart healthy        05/02/24 0843           Regular diet DISCHARGE MEDICATION: Allergies as of 05/02/2024       Reactions   Celebrex [celecoxib] Swelling   Acarbose Other (See Comments)   Gas    Amoxicillin  Hives   Amoxicillin -pot Clavulanate Diarrhea   Diltiazem Other (See Comments)   Headaches   Duloxetine Nausea And Vomiting   Fluoxetine Other (See Comments)   Gi upset    Gabapentin Other (See Comments)   Blurred vision    Lisinopril Cough   Meloxicam Other (See Comments)   Fatigue    Metoprolol Dermatitis   Pregabalin  Other (See Comments)   Fatigue    Sitagliptin Other (See Comments)   Crazy thoughts    Warfarin Diarrhea   Morphine Sulfate Nausea Only   Percocet [oxycodone-acetaminophen ] Itching   Robinul  [glycopyrrolate ] Rash        Medication List     STOP taking these medications    glimepiride  4 MG  tablet Commonly known as: AMARYL        TAKE these medications    8 HR Arthritis Pain Relief 650 MG CR tablet Generic drug: acetaminophen  Take 650 mg by mouth as needed for pain.   allopurinol  300 MG tablet Commonly known as: ZYLOPRIM  Take 300 mg by mouth daily.   amLODipine  5 MG tablet Commonly known as: NORVASC  Take 5 mg by mouth daily.   aspirin  EC 81 MG tablet Take 81 mg by mouth daily.   Calcium  Plus Vitamin D3 600-20 MG-MCG Tabs Generic drug: Calcium  Carb-Cholecalciferol Take 1 tablet by mouth daily in the afternoon.   cephALEXin  500 MG capsule Commonly known as: KEFLEX  Take 1 capsule (500 mg total) by mouth 3 (three) times daily for  5 days.   diclofenac  sodium 1 % Gel Commonly known as: Voltaren  Apply 2 g topically 4 (four) times daily. What changed:  when to take this reasons to take this   escitalopram  5 MG tablet Commonly known as: LEXAPRO  Take 5 mg by mouth daily.   gabapentin 100 MG capsule Commonly known as: NEURONTIN Take 100 mg by mouth daily.   hydrochlorothiazide 25 MG tablet Commonly known as: HYDRODIURIL 1 tablet in the morning Orally Once a day   HYDROcodone -acetaminophen  5-325 MG tablet Commonly known as: NORCO/VICODIN Take 1-2 tablets by mouth every 6 (six) hours as needed for moderate pain.   hydrOXYzine  50 MG tablet Commonly known as: ATARAX  Take 1 tablet (50 mg total) by mouth 3 (three) times daily as needed.   hyoscyamine  0.125 MG SL tablet Commonly known as: LEVSIN  SL Place 1 tablet (0.125 mg total) under the tongue every 6 (six) hours as needed.   LORazepam  0.5 MG tablet Commonly known as: ATIVAN  Take 1 tablet by mouth as needed.   metFORMIN  1000 MG tablet Commonly known as: GLUCOPHAGE  Take 1,000 mg by mouth 2 (two) times daily.   methocarbamol  500 MG tablet Commonly known as: ROBAXIN  Take 1 tablet (500 mg total) by mouth 3 (three) times daily as needed (muscle spasm/pain).   omeprazole  20 MG capsule Commonly known as: PRILOSEC Take 20 mg by mouth daily. What changed: Another medication with the same name was removed. Continue taking this medication, and follow the directions you see here.   ramipril  10 MG capsule Commonly known as: ALTACE  Take 10 mg by mouth daily.   VITAMIN-B COMPLEX PO Take 1 tablet by mouth daily in the afternoon.        Follow-up Information     Corlis Pagan, NP. Schedule an appointment as soon as possible for a visit in 1 week(s).   Contact information: [address] [place] [address] [phone]                Discharge Exam: [name]   04/30/24 0911  Weight: 64.4 kg   General exam: Appears calm  and comfortable  Respiratory system: Clear to auscultation. Respiratory effort normal. Cardiovascular system: S1 & S2 heard, RRR. No JVD,  Gastrointestinal system: Abdomen is nondistended, soft and nontender.  Central nervous system: Alert and oriented. No focal neurological deficits. Extremities: Symmetric 5 x 5 power. Skin: No rashes, lesions or ulcers Psychiatry:  Mood & affect appropriate.    Condition at discharge: fair  The results of significant diagnostics from this hospitalization (including imaging, microbiology, ancillary and laboratory) are listed below for reference.   Imaging Studies: MR BRAIN WO CONTRAST Result Date: 04/30/2024 CLINICAL DATA:  89 year old female with dizziness and vertigo. EXAM: MRI HEAD  WITHOUT CONTRAST TECHNIQUE: Multiplanar, multiecho pulse sequences of the brain and surrounding structures were obtained without intravenous contrast. COMPARISON:  Head CT earlier today.  Brain MRI 10/21/2013. FINDINGS: Brain: No restricted diffusion to suggest acute infarction. No midline shift, mass effect, evidence of mass lesion, ventriculomegaly, extra-axial collection or acute intracranial hemorrhage. Cervicomedullary junction and pituitary are within normal limits. Largely stable since 2015 and normal for age gray and white matter signal in the brain. No cortical encephalomalacia or chronic cerebral blood products identified. Vascular: Major intracranial vascular flow voids are preserved, stable since 2015. Skull and upper cervical spine: Normal for age visible cervical spine, bone marrow signal. Sinuses/Orbits: Chronic left sphenoid sinus disease. Otherwise negative. Other: Trace bilateral mastoid air cell fluid is stable or regressed since 2015. Otherwise grossly normal visible internal auditory structures. Negative visible scalp and face. IMPRESSION: No acute intracranial abnormality. Negative for age noncontrast MRI appearance of the brain. Electronically Signed   By: VEAR Hurst M.D.   On: 04/30/2024 11:37   CT Head Wo Contrast Result Date: 04/30/2024 CLINICAL DATA:  Provided history: Vertigo, central.  Dizziness. EXAM: CT HEAD WITHOUT CONTRAST TECHNIQUE: Contiguous axial images were obtained from the base of the skull through the vertex without intravenous contrast. RADIATION DOSE REDUCTION: This exam was performed according to the departmental dose-optimization program which includes automated exposure control, adjustment of the mA and/or kV according to patient size and/or use of iterative reconstruction technique. COMPARISON:  Brain MRI 08/21/2014.  Head CT 08/20/2014. FINDINGS: Brain: Moderate-to-advanced generalized cerebral atrophy. There is no acute intracranial hemorrhage. No demarcated cortical infarct. No extra-axial fluid collection. No evidence of an intracranial mass. No midline shift. Vascular: No hyperdense vessel.  Atherosclerotic calcifications. Skull: No calvarial fracture or aggressive osseous lesion. Sinuses/Orbits: No mass or acute finding within the imaged orbits. Minimal mucosal thickening within the bilateral maxillary sinuses at the imaged levels. Severe mucosal thickening within the left sphenoid sinus (with associated chronic reactive osteitis). IMPRESSION: 1.  No evidence of an acute intracranial abnormality. 2. Moderate-to-advanced generalized cerebral atrophy. 3. Paranasal sinus disease at the imaged levels, as described. Electronically Signed   By: Rockey Childs D.O.   On: 04/30/2024 09:43   DG Chest Port 1 View Result Date: 04/30/2024 CLINICAL DATA:  Weakness. EXAM: PORTABLE CHEST 1 VIEW COMPARISON:  03/07/2022. FINDINGS: Low lung volume. Moderately elevated right hemidiaphragm. Bilateral lung fields are clear. Bilateral costophrenic angles are clear. Stable cardio-mediastinal silhouette. No acute osseous abnormalities. Thoracolumbar spinal fixation hardware seen. The soft tissues are within normal limits. IMPRESSION: No active disease.  Electronically Signed   By: Ree Molt M.D.   On: 04/30/2024 09:37    Microbiology: Results for orders placed or performed during the hospital encounter of 04/30/24  Urine Culture     Status: Abnormal   Collection Time: 04/30/24 10:12 AM   Specimen: Urine, Random  Result Value Ref Range Status   Specimen Description   Final    URINE, RANDOM Performed at [facility], [address] [place], [place] [address]    Special Requests   Final    NONE Reflexed from [id number] Performed at [facility], [address] [place], [place] [address]    Culture >=100,000 COLONIES/mL KLEBSIELLA PNEUMONIAE (A)  Final   Report Status 05/02/2024 FINAL  Final   Organism ID, Bacteria KLEBSIELLA PNEUMONIAE (A)  Final      Susceptibility   Klebsiella pneumoniae - MIC*    AMPICILLIN RESISTANT Resistant  CEFAZOLIN  <=4 SENSITIVE Sensitive     CEFEPIME <=0.12 SENSITIVE Sensitive     CEFTRIAXONE  <=0.25 SENSITIVE Sensitive     CIPROFLOXACIN <=0.25 SENSITIVE Sensitive     GENTAMICIN <=1 SENSITIVE Sensitive     IMIPENEM <=0.25 SENSITIVE Sensitive     NITROFURANTOIN 64 INTERMEDIATE Intermediate     TRIMETH/SULFA <=20 SENSITIVE Sensitive     AMPICILLIN/SULBACTAM <=2 SENSITIVE Sensitive     PIP/TAZO <=4 SENSITIVE Sensitive ug/mL    * >=100,000 COLONIES/mL KLEBSIELLA PNEUMONIAE    Labs: CBC: Recent Labs  Lab 04/30/24 1012 04/30/24 1027 05/01/24 0600  WBC 9.2  --  8.2  NEUTROABS 6.2  --   --   HGB 11.2* 11.9* 10.2*  HCT 35.3* 35.0* 31.5*  MCV 93.9  --  95.7  PLT 252  --  228   Basic Metabolic Panel: Recent Labs  Lab 04/30/24 1012 04/30/24 1027 05/01/24 0600  NA 139 141 141  K 4.1 4.1 4.5  CL 103 104 106  CO2 23  --  26  GLUCOSE 141* 137* 95  BUN 25* 23 18  CREATININE 1.06* 1.10* 0.80  CALCIUM  9.3  --  9.6   Liver Function Tests: Recent Labs  Lab 04/30/24 1012  AST 27  ALT 17  ALKPHOS 77  BILITOT 0.6  PROT 7.6  ALBUMIN 4.2    CBG: Recent Labs  Lab 05/01/24 0751 05/01/24 1159 05/01/24 1654 05/01/24 2045 05/02/24 0746  GLUCAP 99 182* 174* 180* 175*    Discharge time spent: 35 minutes.   Signed: Elgie Butter, MD [facility] 05/02/2024

## 2024-05-02 NOTE — TOC Transition Note (Signed)
 Transition of Care [facility]) - Discharge Note   Patient Details  Name: [name] MRN: [id number] Date of Birth: [birth year]  Transition of [facility]) CM/SW Contact:  Sheri ONEIDA Sharps, LCSW Phone Number: 05/02/2024, 10:48 AM   Clinical Narrative:    Pt medically ready to dc home with HH. HH setup w/ [facility]. Pt recommended DME cane. Cane ordered to be delivered to home via [employer]. No further TOC needs.    Final next level of care: Home w Home Health Services     Patient Goals and CMS Choice     Choice offered to / list presented to : NA      Discharge Placement                       Discharge Plan and Services Additional resources added to the After Visit Summary for                  DME Arranged: [place] DME Agency: [employer] Date DME Agency Contacted: 05/02/24 Time DME Agency Contacted: 1048 Representative spoke with at DME Agency: [facility] HH Arranged: PT HH Agency: [facility] Date [facility] Agency Contacted: 05/02/24 Time HH Agency Contacted: 1048 Representative spoke with at [facility] Agency: [place]  Social Drivers of Health (SDOH) Interventions SDOH Screenings   Food Insecurity: No Food Insecurity (04/30/2024)  Housing: High Risk (04/30/2024)  Transportation Needs: No Transportation Needs (04/30/2024)  Utilities: Not At Risk (04/30/2024)  Social Connections: Socially Isolated (04/30/2024)  Tobacco Use: Low Risk  (04/20/2023)     Readmission Risk Interventions    05/02/2024   10:40 AM  Readmission Risk Prevention Plan  Post Dischage Appt Complete  Medication Screening Complete  Transportation Screening Complete

## 2024-05-02 NOTE — Progress Notes (Signed)
 AVS reviewed w/ pt who verbalized an understanding. Pt states she does not like to drink water- family to try flavored water w/ stevia. This RN explained the importance of hydration while on Keflex and daily to prevent dizziness. PIV removed as noted. Ride here- pt waiting to see if she will be receiving a cane prior to arrival.

## 2024-06-16 ENCOUNTER — Other Ambulatory Visit: Payer: Self-pay

## 2024-06-16 ENCOUNTER — Emergency Department (HOSPITAL_BASED_OUTPATIENT_CLINIC_OR_DEPARTMENT_OTHER)

## 2024-06-16 ENCOUNTER — Observation Stay (HOSPITAL_BASED_OUTPATIENT_CLINIC_OR_DEPARTMENT_OTHER)
Admission: EM | Admit: 2024-06-16 | Discharge: 2024-06-18 | Disposition: A | Discharge status: Home / Self Care | Attending: Emergency Medicine | Admitting: Emergency Medicine

## 2024-06-16 DIAGNOSIS — E11649 Type 2 diabetes mellitus with hypoglycemia without coma: Secondary | ICD-10-CM | POA: Diagnosis not present

## 2024-06-16 DIAGNOSIS — J9601 Acute respiratory failure with hypoxia: Secondary | ICD-10-CM | POA: Insufficient documentation

## 2024-06-16 DIAGNOSIS — D638 Anemia in other chronic diseases classified elsewhere: Secondary | ICD-10-CM

## 2024-06-16 DIAGNOSIS — K219 Gastro-esophageal reflux disease without esophagitis: Secondary | ICD-10-CM | POA: Insufficient documentation

## 2024-06-16 DIAGNOSIS — E119 Type 2 diabetes mellitus without complications: Secondary | ICD-10-CM

## 2024-06-16 DIAGNOSIS — E860 Dehydration: Secondary | ICD-10-CM | POA: Diagnosis not present

## 2024-06-16 DIAGNOSIS — E162 Hypoglycemia, unspecified: Secondary | ICD-10-CM | POA: Diagnosis present

## 2024-06-16 DIAGNOSIS — R531 Weakness: Secondary | ICD-10-CM | POA: Diagnosis not present

## 2024-06-16 DIAGNOSIS — E872 Acidosis, unspecified: Secondary | ICD-10-CM | POA: Diagnosis not present

## 2024-06-16 DIAGNOSIS — E559 Vitamin D deficiency, unspecified: Secondary | ICD-10-CM | POA: Insufficient documentation

## 2024-06-16 DIAGNOSIS — D649 Anemia, unspecified: Secondary | ICD-10-CM | POA: Insufficient documentation

## 2024-06-16 DIAGNOSIS — R42 Dizziness and giddiness: Secondary | ICD-10-CM | POA: Diagnosis not present

## 2024-06-16 DIAGNOSIS — R55 Syncope and collapse: Secondary | ICD-10-CM | POA: Insufficient documentation

## 2024-06-16 DIAGNOSIS — R1110 Vomiting, unspecified: Secondary | ICD-10-CM | POA: Diagnosis present

## 2024-06-16 DIAGNOSIS — I10 Essential (primary) hypertension: Secondary | ICD-10-CM | POA: Diagnosis not present

## 2024-06-16 DIAGNOSIS — Z7984 Long term (current) use of oral hypoglycemic drugs: Secondary | ICD-10-CM | POA: Insufficient documentation

## 2024-06-16 DIAGNOSIS — E118 Type 2 diabetes mellitus with unspecified complications: Secondary | ICD-10-CM | POA: Insufficient documentation

## 2024-06-16 DIAGNOSIS — R10819 Abdominal tenderness, unspecified site: Secondary | ICD-10-CM | POA: Diagnosis not present

## 2024-06-16 DIAGNOSIS — Z7982 Long term (current) use of aspirin: Secondary | ICD-10-CM | POA: Diagnosis not present

## 2024-06-16 DIAGNOSIS — E039 Hypothyroidism, unspecified: Secondary | ICD-10-CM | POA: Insufficient documentation

## 2024-06-16 DIAGNOSIS — Z79899 Other long term (current) drug therapy: Secondary | ICD-10-CM | POA: Diagnosis not present

## 2024-06-16 DIAGNOSIS — R197 Diarrhea, unspecified: Secondary | ICD-10-CM | POA: Insufficient documentation

## 2024-06-16 DIAGNOSIS — R5383 Other fatigue: Secondary | ICD-10-CM | POA: Insufficient documentation

## 2024-06-16 DIAGNOSIS — R110 Nausea: Secondary | ICD-10-CM

## 2024-06-16 LAB — TROPONIN T, HIGH SENSITIVITY
Troponin T High Sensitivity: 16 ng/L (ref 0–19)
Troponin T High Sensitivity: 19 ng/L (ref 0–19)

## 2024-06-16 LAB — URINALYSIS, W/ REFLEX TO CULTURE (INFECTION SUSPECTED)
Bacteria, UA: NONE SEEN
Bilirubin Urine: NEGATIVE
Glucose, UA: NEGATIVE mg/dL
Hgb urine dipstick: NEGATIVE
Ketones, ur: NEGATIVE mg/dL
Leukocytes,Ua: NEGATIVE
Nitrite: NEGATIVE
Protein, ur: NEGATIVE mg/dL
Specific Gravity, Urine: <1.005 — ABNORMAL LOW (ref 1.005–1.030)
pH: 5.5 (ref 5.0–8.0)

## 2024-06-16 LAB — CBC WITH DIFFERENTIAL/PLATELET
Abs Immature Granulocytes: 0.03 K/uL (ref 0.00–0.07)
Basophils Absolute: 0.1 K/uL (ref 0.0–0.1)
Basophils Relative: 1 %
Eosinophils Absolute: 0 K/uL (ref 0.0–0.5)
Eosinophils Relative: 0 %
HCT: 35.5 % — ABNORMAL LOW (ref 36.0–46.0)
Hemoglobin: 11.5 g/dL — ABNORMAL LOW (ref 12.0–15.0)
Immature Granulocytes: 0 %
Lymphocytes Relative: 25 %
Lymphs Abs: 1.8 K/uL (ref 0.7–4.0)
MCH: 30.3 pg (ref 26.0–34.0)
MCHC: 32.4 g/dL (ref 30.0–36.0)
MCV: 93.7 fL (ref 80.0–100.0)
Monocytes Absolute: 0.4 K/uL (ref 0.1–1.0)
Monocytes Relative: 6 %
Neutro Abs: 5 K/uL (ref 1.7–7.7)
Neutrophils Relative %: 68 %
Platelets: 275 K/uL (ref 150–400)
RBC: 3.79 MIL/uL — ABNORMAL LOW (ref 3.87–5.11)
RDW: 15.1 % (ref 11.5–15.5)
WBC: 7.3 K/uL (ref 4.0–10.5)
nRBC: 0 % (ref 0.0–0.2)

## 2024-06-16 LAB — BASIC METABOLIC PANEL WITH GFR
Anion gap: 15 (ref 5–15)
BUN: 9 mg/dL (ref 8–23)
CO2: 20 mmol/L — ABNORMAL LOW (ref 22–32)
Calcium: 9.5 mg/dL (ref 8.9–10.3)
Chloride: 102 mmol/L (ref 98–111)
Creatinine, Ser: 0.77 mg/dL (ref 0.44–1.00)
GFR, Estimated: >60 mL/min (ref >60)
Glucose, Bld: 45 mg/dL — ABNORMAL LOW (ref 70–99)
Potassium: 4 mmol/L (ref 3.5–5.1)
Sodium: 137 mmol/L (ref 135–145)

## 2024-06-16 LAB — COMPREHENSIVE METABOLIC PANEL WITH GFR
ALT: 10 U/L (ref 0–44)
AST: 24 U/L (ref 15–41)
Albumin: 4.1 g/dL (ref 3.5–5.0)
Alkaline Phosphatase: 88 U/L (ref 38–126)
Anion gap: 16 — ABNORMAL HIGH (ref 5–15)
BUN: 11 mg/dL (ref 8–23)
CO2: 19 mmol/L — ABNORMAL LOW (ref 22–32)
Calcium: 9.9 mg/dL (ref 8.9–10.3)
Chloride: 101 mmol/L (ref 98–111)
Creatinine, Ser: 0.83 mg/dL (ref 0.44–1.00)
GFR, Estimated: >60 mL/min (ref >60)
Glucose, Bld: 150 mg/dL — ABNORMAL HIGH (ref 70–99)
Potassium: 4.3 mmol/L (ref 3.5–5.1)
Sodium: 136 mmol/L (ref 135–145)
Total Bilirubin: 0.3 mg/dL (ref 0.0–1.2)
Total Protein: 6.9 g/dL (ref 6.5–8.1)

## 2024-06-16 LAB — CBG MONITORING, ED
Glucose-Capillary: 30 mg/dL — CL (ref 70–99)
Glucose-Capillary: 53 mg/dL — ABNORMAL LOW (ref 70–99)
Glucose-Capillary: 89 mg/dL (ref 70–99)

## 2024-06-16 LAB — LIPASE, BLOOD: Lipase: 12 U/L (ref 11–51)

## 2024-06-16 LAB — RESP PANEL BY RT-PCR (RSV, FLU A&B, COVID)  RVPGX2
Influenza A by PCR: NEGATIVE
Influenza B by PCR: NEGATIVE
Resp Syncytial Virus by PCR: NEGATIVE
SARS Coronavirus 2 by RT PCR: NEGATIVE

## 2024-06-16 LAB — D-DIMER, QUANTITATIVE: D-Dimer, Quant: 0.79 ug{FEU}/mL — ABNORMAL HIGH (ref 0.00–0.50)

## 2024-06-16 LAB — OCCULT BLOOD X 1 CARD TO LAB, STOOL: Fecal Occult Bld: NEGATIVE

## 2024-06-16 MED ORDER — SENNOSIDES-DOCUSATE SODIUM 8.6-50 MG PO TABS
1.0000 | ORAL_TABLET | Freq: Every evening | ORAL | Status: DC | PRN
Start: 2024-06-16 — End: 2024-06-18

## 2024-06-16 MED ORDER — LACTATED RINGERS IV BOLUS
500.0000 mL | Freq: Once | INTRAVENOUS | Status: AC
  Administered 2024-06-16: 500 mL via INTRAVENOUS

## 2024-06-16 MED ORDER — ACETAMINOPHEN 650 MG RE SUPP: 650.0000 mg | Freq: Four times a day (QID) | RECTAL | Status: DC | PRN

## 2024-06-16 MED ORDER — DEXTROSE IN LACTATED RINGERS 5 % IV SOLN: INTRAVENOUS | Status: DC

## 2024-06-16 MED ORDER — ACETAMINOPHEN 325 MG PO TABS: 650.0000 mg | ORAL_TABLET | Freq: Four times a day (QID) | ORAL | Status: DC | PRN

## 2024-06-16 MED ORDER — IOHEXOL 300 MG/ML  SOLN
100.0000 mL | Freq: Once | INTRAMUSCULAR | Status: AC | PRN
  Administered 2024-06-16: 85 mL via INTRAVENOUS

## 2024-06-16 MED ORDER — HEPARIN SODIUM (PORCINE) 5000 UNIT/ML IJ SOLN
5000.0000 [IU] | Freq: Three times a day (TID) | INTRAMUSCULAR | Status: DC
  Administered 2024-06-17 (×3): 5000 [IU] via SUBCUTANEOUS
  Filled 2024-06-16 (×4): qty 1

## 2024-06-16 MED ORDER — ONDANSETRON HCL 4 MG/2ML IJ SOLN
4.0000 mg | Freq: Once | INTRAMUSCULAR | Status: AC
  Administered 2024-06-16: 4 mg via INTRAVENOUS
  Filled 2024-06-16: qty 2

## 2024-06-16 NOTE — ED Triage Notes (Signed)
 C/o n/v/d since yesterday with generalized body aches. Denies fevers.

## 2024-06-16 NOTE — ED Notes (Signed)
 Report given to the floor RN.

## 2024-06-16 NOTE — ED Notes (Signed)
 Attempted to ambulate Pt, Pt was stood up and became dizzy/unsteady... Pt placed back in the bed... Pts SpO2 was 91% during the short ambulation... Provider and RT notified.[email or web address]

## 2024-06-16 NOTE — ED Provider Notes (Signed)
 [place] [facility] AT [facility] Provider Note   CSN: [id number] Arrival date & time: 06/16/24  1029     Patient presents with: Emesis   [name] is a 89 y.o. female.    Emesis    89 y.o. female with medical history significant for type 2 diabetes, hypertension, GERD presenting to the emergency department with nausea, loose stools.  The patient states that she was in her otherwise normal state of health when she woke up this morning with a nauseated sensation.  She denied any chest pain or shortness of breath.  She took Pepto-Bismol this morning without relief.  Her stools this morning were also noted to be somewhat dark in coloration.  She had 1 episode of loose stools, denies any bright red blood in her stool.  She denies any vomiting.  No fevers or chills.  She is passing gas. She endorses generalized body aches.  Prior to Admission medications   Medication Sig Start Date End Date Taking? Authorizing Provider  acetaminophen  (8 HR ARTHRITIS PAIN RELIEF) 650 MG CR tablet Take 650 mg by mouth as needed for pain.    [provider]  allopurinol  (ZYLOPRIM ) 300 MG tablet Take 300 mg by mouth daily.    [provider]  amLODipine  (NORVASC ) 5 MG tablet Take 5 mg by mouth daily.      [provider]  aspirin  EC 81 MG tablet Take 81 mg by mouth daily.    [provider]  B Complex Vitamins (VITAMIN-B COMPLEX PO) Take 1 tablet by mouth daily in the afternoon.    [provider]  Calcium  Carb-Cholecalciferol (CALCIUM  PLUS VITAMIN D3) 600-20 MG-MCG TABS Take 1 tablet by mouth daily in the afternoon.    [provider]  diclofenac  sodium (VOLTAREN ) 1 % GEL Apply 2 g topically 4 (four) times daily. Patient taking differently: Apply 2 g topically as needed. 07/02/18   Vernetta Lonni GRADE, MD  escitalopram  (LEXAPRO ) 5 MG tablet Take 5 mg by mouth daily. 08/31/22   [provider]  gabapentin (NEURONTIN)  100 MG capsule Take 100 mg by mouth daily. Patient not taking: Reported on 04/20/2023 04/19/23   [provider]  hydrochlorothiazide (HYDRODIURIL) 25 MG tablet 1 tablet in the morning Orally Once a day 04/04/24   [provider]  HYDROcodone -acetaminophen  (NORCO/VICODIN) 5-325 MG tablet Take 1-2 tablets by mouth every 6 (six) hours as needed for moderate pain. 07/02/18   Vernetta Lonni GRADE, MD  hydrOXYzine  (ATARAX /VISTARIL ) 50 MG tablet Take 1 tablet (50 mg total) by mouth 3 (three) times daily as needed. 01/02/17   Aneita Gwendlyn DASEN, MD  hyoscyamine  (LEVSIN  SL) 0.125 MG SL tablet Place 1 tablet (0.125 mg total) under the tongue every 6 (six) hours as needed. 04/20/23   Beather Delon Gibson, PA  LORazepam  (ATIVAN ) 0.5 MG tablet Take 1 tablet by mouth as needed. 10/10/16   [provider]  metFORMIN  (GLUCOPHAGE ) 1000 MG tablet Take 1,000 mg by mouth 2 (two) times daily. 02/21/16   [provider]  methocarbamol  (ROBAXIN ) 500 MG tablet Take 1 tablet (500 mg total) by mouth 3 (three) times daily as needed (muscle spasm/pain). 03/07/22   Steinl, Kevin, MD  omeprazole  (PRILOSEC) 20 MG capsule Take 20 mg by mouth daily.    [provider]  ramipril  (ALTACE ) 10 MG capsule Take 10 mg by mouth daily.      [provider]    Allergies: Celebrex [celecoxib], Acarbose, Amoxicillin , Amoxicillin -pot clavulanate, Diltiazem,  Duloxetine, Fluoxetine, Gabapentin, Lisinopril, Meloxicam, Metoprolol, Pregabalin , Sitagliptin, Warfarin, Morphine sulfate, Percocet [oxycodone-acetaminophen ], and Robinul  [glycopyrrolate ]    Review of Systems  Gastrointestinal:  Positive for vomiting.  All other systems reviewed and are negative.   Updated Vital Signs BP (!) 168/56 (BP Location: Right Arm)   Pulse 63   Temp 98.1 F (36.7 C) (Oral)   Resp 17   SpO2 92%   Physical Exam Vitals and nursing note reviewed.  Constitutional:      General: She is not in acute distress.     Appearance: She is well-developed.  HENT:     Head: Normocephalic and atraumatic.  Eyes:     Conjunctiva/sclera: Conjunctivae normal.  Cardiovascular:     Rate and Rhythm: Normal rate and regular rhythm.     Heart sounds: No murmur heard. Pulmonary:     Effort: Pulmonary effort is normal. No respiratory distress.     Breath sounds: Normal breath sounds.  Abdominal:     Palpations: Abdomen is soft.     Tenderness: There is generalized abdominal tenderness.  Musculoskeletal:        General: No swelling.     Cervical back: Neck supple.  Skin:    General: Skin is warm and dry.     Capillary Refill: Capillary refill takes less than 2 seconds.  Neurological:     Mental Status: She is alert.     Comments: MENTAL STATUS EXAM:    Orientation: Alert and oriented to person, place and time.  Memory: Cooperative, follows commands well.  Language: Speech is clear and language is normal.   CRANIAL NERVES:    CN 2 (Optic): Visual fields intact to confrontation.  CN 3,4,6 (EOM): Pupils equal and reactive to light. Full extraocular eye movement without nystagmus.  CN 5 (Trigeminal): Facial sensation is normal, no weakness of masticatory muscles.  CN 7 (Facial): No facial weakness or asymmetry.  CN 8 (Auditory): Auditory acuity grossly normal.  CN 9,10 (Glossophar): The uvula is midline, the palate elevates symmetrically.  CN 11 (spinal access): Normal sternocleidomastoid and trapezius strength.  CN 12 (Hypoglossal): The tongue is midline. No atrophy or fasciculations.SABRA   MOTOR:  Muscle Strength: 5/5RUE, 5/5LUE, 5/5RLE, 5/5LLE.   COORDINATION:   Intact finger-to-nose, no tremor.   SENSATION:   Intact to light touch all four extremities.  GAIT: Gait not assessed   Psychiatric:        Mood and Affect: Mood normal.     (all labs ordered are listed, but only abnormal results are displayed) Labs Reviewed  CBC WITH DIFFERENTIAL/PLATELET - Abnormal; Notable for the following components:       Result Value   RBC 3.79 (*)    Hemoglobin 11.5 (*)    HCT 35.5 (*)    All other components within normal limits  COMPREHENSIVE METABOLIC PANEL WITH GFR - Abnormal; Notable for the following components:   CO2 19 (*)    Glucose, Bld 150 (*)    Anion gap 16 (*)    All other components within normal limits  URINALYSIS, W/ REFLEX TO CULTURE (INFECTION SUSPECTED) - Abnormal; Notable for the following components:   Color, Urine COLORLESS (*)    Specific Gravity, Urine <1.005 (*)    All other components within normal limits  BASIC METABOLIC PANEL WITH GFR - Abnormal; Notable for the following components:   CO2 20 (*)    Glucose, Bld 45 (*)    All other components within normal limits  D-DIMER, QUANTITATIVE - Abnormal; Notable  for the following components:   D-Dimer, Quant 0.79 (*)    All other components within normal limits  CBG MONITORING, ED - Abnormal; Notable for the following components:   Glucose-Capillary 30 (*)    All other components within normal limits  CBG MONITORING, ED - Abnormal; Notable for the following components:   Glucose-Capillary 53 (*)    All other components within normal limits  RESP PANEL BY RT-PCR (RSV, FLU A&B, COVID)  RVPGX2  LIPASE, BLOOD  OCCULT BLOOD X 1 CARD TO LAB, STOOL  POC OCCULT BLOOD, ED  TROPONIN T, HIGH SENSITIVITY  TROPONIN T, HIGH SENSITIVITY    EKG: EKG Interpretation Date/Time:  Monday June 16 2024 10:47:31 EDT Ventricular Rate:  66 PR Interval:  278 QRS Duration:  131 QT Interval:  438 QTC Calculation: 459 R Axis:   94  Text Interpretation: Sinus or ectopic atrial rhythm Prolonged PR interval Right bundle branch block Confirmed by Jerrol Agent ([id number]) on 06/16/2024 11:06:13 AM  Radiology: CT ABDOMEN PELVIS W CONTRAST Result Date: 06/16/2024 EXAM: CT ABDOMEN AND PELVIS WITH CONTRAST 06/16/2024 12:59:35 PM TECHNIQUE: CT of the abdomen and pelvis was performed with the administration of 85mL iohexol  (OMNIPAQUE ) 300 MG/ML  solution. Multiplanar reformatted images are provided for review. Automated exposure control, iterative reconstruction, and/or weight-based adjustment of the mA/kV was utilized to reduce the radiation dose to as low as reasonably achievable. COMPARISON: None available. CLINICAL HISTORY: Abdominal pain, acute, nonlocalized; LLQ abdominal pain. Triage note: C/o n/v/d since yesterday with generalized body aches. Denies fevers. FINDINGS: LOWER CHEST: Dense calcifications are present at the mitral valve annulus. Coronary artery calcifications are present. A small hiatal hernia is present. LIVER: Diffuse fatty infiltration of the liver is noted. GALLBLADDER AND BILE DUCTS: Cholecystectomy. SPLEEN: Normal size. No focal lesion. PANCREAS: Advanced atrophy is present in the pancreas. Pancreatic duct is mildly dilated measuring up to 8 mm in the distal body. No obstructing lesion is present. ADRENAL GLANDS: Normal appearance. No mass. KIDNEYS, URETERS AND BLADDER: Renal scarring is present with scattered areas of parenchymal thinning. No stones in the kidneys or ureters. No hydronephrosis. No perinephric or periureteral stranding. Urinary bladder is unremarkable. GI AND BOWEL: Stomach demonstrates no acute abnormality. There is no bowel obstruction. No bowel wall thickening. PERITONEUM AND RETROPERITONEUM: No ascites. No free air. VASCULATURE: Extensive atherosclerotic calcifications are present in the aorta and branch vessels. No aneurysm is present. LYMPH NODES: No lymphadenopathy. REPRODUCTIVE ORGANS: Hysterectomy. BONES AND SOFT TISSUES: Thoracolumbar fusion extends T10-L4. Degenerative changes are present at the SI joints bilaterally. No acute osseous abnormality. No focal soft tissue abnormality. IMPRESSION: 1. No acute findings in the abdomen or pelvis related to the clinical history of abdominal pain. 2. Other, non-acute and/or normal findings as above. Electronically signed by: Lonni Necessary MD 06/16/2024 01:10  PM EDT RP Workstation: [id number]     .Critical Care  Performed by: Jerrol Agent, MD Authorized by: Jerrol Agent, MD   Critical care provider statement:    Critical care time (minutes):  30   Critical care was necessary to treat or prevent imminent or life-threatening deterioration of the following conditions:  Endocrine crisis   Critical care was time spent personally by me on the following activities:  Development of treatment plan with patient or surrogate, discussions with consultants, evaluation of patient's response to treatment, examination of patient, ordering and review of laboratory studies, ordering and review of radiographic studies, ordering and performing treatments and interventions, pulse oximetry, re-evaluation of patient's condition and review  of old charts   Care discussed with: admitting provider      Medications Ordered in the ED  lactated ringers  bolus 500 mL (0 mLs Intravenous Stopped 06/16/24 1230)  ondansetron  (ZOFRAN ) injection 4 mg (4 mg Intravenous Given 06/16/24 1128)  iohexol  (OMNIPAQUE ) 300 MG/ML solution 100 mL (85 mLs Intravenous Contrast Given 06/16/24 1253)                                    Medical Decision Making Amount and/or Complexity of Data Reviewed Labs: ordered. Radiology: ordered.  Risk Prescription drug management. Decision regarding hospitalization.    89 y.o. female with medical history significant for type 2 diabetes, hypertension, GERD presenting to the emergency department with nausea, loose stools.  The patient states that she was in her otherwise normal state of health when she woke up this morning with a nauseated sensation.  She denied any chest pain or shortness of breath.  She took Pepto-Bismol this morning without relief.  Her stools this morning were also noted to be somewhat dark in coloration.  She had 1 episode of loose stools, denies any bright red blood in her stool.  She denies any vomiting.  No fevers or chills.  She is  passing gas. She endorses generalized body aches.  On arrival, the patient was afebrile, temperature 98.6, not tachycardic or tachypneic, hypertensive BP 198/86, saturating 96% on room air.  On exam the patient had generalized abdominal tenderness to palpation, no rebound or guarding, lungs CTAB on exam.  Patient without chest pain, no ripping or tearing component, no abdominal pain at rest but notably tender on exam.  Considered partial bowel obstruction, ileus, developing small bowel obstruction, considered viral illness.  Considered ACS although feel less likely.  Considered GERD, peptic ulcer disease.  Patient with dark stools however on rectal exam no evidence of melena or hematochezia and the patient was fecal occult negative.  EKG: Sinus rhythm, ventricular rate 66, prolonged PR interval at 278, right bundle branch block present, no acute ischemic changes.  Initial CMP revealed an anion gap acidosis with a bicarbonate of 19, blood glucose 150, anion gap 16, no other electrolyte abnormality.  CBC revealed no leukocytosis, mild anemia to 11.5, urinalysis without evidence of UTI. Lipase normal.  Cardiac troponins x 2 were negative.  COVID flu and RSV PCR testing resulted negative.  CT abdomen pelvis: IMPRESSION:  1. No acute findings in the abdomen or pelvis related to the clinical history  of abdominal pain.  2. Other, non-acute and/or normal findings as above.    Repeat BMP revealed improvement in the patient's acidosis with a bicarbonate of 20, anion gap resolved to 15 however patient with new hyperglycemia to 45, subsequently down trended to 30.  The patient was administered crackers and juice with improvement to 53 and then subsequently 89 on recheck.  Had attempted ambulation, the patient felt near syncopal.  Symptoms could have been due to the patient's hypoglycemia however the patient also had new onset hypoxia on ambulation with O2 saturations dropping down to 88%.  Within normal  D-dimer after adjustment for age, low suspicion for PE.  Patient's lungs are clear on exam.  Had received 500 cc IV fluid resuscitation.  No known history of CHF.  She endorses persistent nausea and has had decreased p.o. intake.  In the setting of the constellation of symptoms, hospitalist medicine was consulted for observation, Dr. Marianna accepting.  Final diagnoses:  Nausea  Diarrhea, unspecified type  Acute respiratory failure with hypoxia (HCC)  Postural dizziness with near syncope  Hypoglycemia    ED Discharge Orders     None          Jerrol Agent, MD 06/16/24 1659

## 2024-06-16 NOTE — ED Notes (Signed)
 Pt placed on 2LPM, [place], O2 due to decrease in SpO2... Provider and RT informed.SABRASABRA

## 2024-06-16 NOTE — ED Notes (Signed)
 Report given to [facility].

## 2024-06-16 NOTE — ED Notes (Signed)
 Given water per MD... No Nausea, Vomiting... Provider informed.SABRASABRA

## 2024-06-16 NOTE — H&P (Signed)
 History and Physical    Patient: [name] FMW:[id number] DOB: November 11, 1927 DOA: 06/16/2024 DOS: the patient was seen and examined on 06/16/2024 PCP: Darlene Pagan, NP  Patient coming from: Home  Chief Complaint:  Chief Complaint  Patient presents with   Emesis   HPI: [name] is a 89 y.o. female with medical history significant for DMT2, and HTN presents with not feeling well.  She reported nausea to the ED.  To me she just says she has been woozy since last night.  She denies nausea at this time. The patient tells me that yesterday she worked outside in her garden all day.  At the end of the evening she had a cheese sandwich and some popcorn for dinner she took a shower but says she felt pretty woozy last night.  This morning she got up and was still woozy. She also had 1 episode of loose stool.  The patient says she was okay other than the wooziness.  She says she has felt woozy before. She denies any fevers or chills or abdominal pain.  In the emergency department during her evaluation it was felt that her abdomen was tender on exam.  She ended having a CT scan of the abdomen and pelvis which did not have any acute findings.  She did have the 1 episode of loose stools this morning and took some Pepto-Bismol.  Her stool guaiac in the emergency department was negative for any blood.  She was also found to be hypoglycemic with a blood sugar of 45.  The patient's hypoglycemia was addressed but she remained woozy so was decided to keep her in the hospital for monitoring.     Review of Systems: As mentioned in the history of present illness. All other systems reviewed and are negative. Past Medical History:  Diagnosis Date   Cancer (HCC)    melanoma- R arm , area excised    Degenerative joint disease    Depression    Diabetes mellitus, type 2 (HCC)    Fundic gland polyps of stomach, benign    GERD (gastroesophageal reflux disease)    Gout    Helicobacter pylori gastritis     Hiatal hernia    Hypertension    Lumbar spinal stenosis    Osteoarthritis    Shingles    current shingles- x4 yrs., on L side of face      Tubular adenoma of colon 07/17/2011   Past Surgical History:  Procedure Laterality Date   AMPUTATION Left 03/04/2016   Procedure: FOOT FIRST RAY AMPUTATION;  Surgeon: Jerona Harden GAILS, MD;  Location: [facility] OR;  Service: Orthopedics;  Laterality: Left;   benign br. biopsy- many yrs. ago     BREAST EXCISIONAL BIOPSY Right    CARPAL TUNNEL RELEASE     bilateral   CATARACT EXTRACTION     bilateral, /w IOL   CHOLECYSTECTOMY  1998   HEMORRHOID SURGERY     x 2   LUMBAR LAMINECTOMY     6 prior back surgery    RETINAL LASER PROCEDURE     bilateral   ROTATOR CUFF REPAIR     bilateral   SPINAL FUSION  08/22/2011   Procedure: FUSION POSTERIOR SPINAL MULTILEVEL/SCOLIOSIS;  Surgeon: GORMAN Ozell Mans;  Location: [facility] OR;  Service: Orthopedics;  Laterality: N/A;  LUMBAR LAMINECTOMY, EXTENSION OF POSTERIOR LUMBAR FUSION TO T10 WITH K2M RODS, SCREWS, CONNECTORS, HOOKS, ILIAC CREST BONE GRAFT   TONSILLECTOMY     TOTAL KNEE ARTHROPLASTY  left   VAGINAL HYSTERECTOMY  1990's   Social History:  reports that she has never smoked. She has never used smokeless tobacco. She reports that she does not drink alcohol  and does not use drugs.  Allergies  Allergen Reactions   Celebrex [Celecoxib] Swelling   Acarbose Other (See Comments)    Gas    Amoxicillin  Hives   Amoxicillin -Pot Clavulanate Diarrhea   Bisoprolol     Other Reaction(s): fatigue   Diltiazem Other (See Comments)    Headaches   Duloxetine Nausea And Vomiting   Fluoxetine Other (See Comments)    Gi upset    Gabapentin Other (See Comments)    Blurred vision    Lisinopril Cough   Meloxicam Other (See Comments)    Fatigue    Metoprolol Dermatitis   Pregabalin  Other (See Comments)    Fatigue    Sitagliptin Other (See Comments)    Crazy thoughts    Warfarin Diarrhea   Morphine Sulfate Nausea Only    Percocet [Oxycodone-Acetaminophen ] Itching   Robinul  [Glycopyrrolate ] Rash    Family History  Problem Relation Age of Onset   Diabetes Mother    Cancer Mother        mother   Diabetes Paternal Aunt        x 1   Diabetes Paternal Uncle        x 3   Diabetes Brother    Diabetes Other        neice   Heart disease Father    Cancer Father        gallbladder   Liver cancer Father    Heart disease Brother    Colon polyps Sister    Colon cancer Neg Hx    Anesthesia problems Neg Hx    Hypotension Neg Hx    Malignant hyperthermia Neg Hx    Pseudochol deficiency Neg Hx     Prior to Admission medications   Medication Sig Start Date End Date Taking? Authorizing Provider  acetaminophen  (8 HR ARTHRITIS PAIN RELIEF) 650 MG CR tablet Take 650 mg by mouth as needed for pain.    [provider]  allopurinol  (ZYLOPRIM ) 300 MG tablet Take 300 mg by mouth daily.    [provider]  amLODipine  (NORVASC ) 5 MG tablet Take 5 mg by mouth daily.      [provider]  aspirin  EC 81 MG tablet Take 81 mg by mouth daily.    [provider]  B Complex Vitamins (VITAMIN-B COMPLEX PO) Take 1 tablet by mouth daily in the afternoon.    [provider]  Calcium  Carb-Cholecalciferol (CALCIUM  PLUS VITAMIN D3) 600-20 MG-MCG TABS Take 1 tablet by mouth daily in the afternoon.    [provider]  diclofenac  sodium (VOLTAREN ) 1 % GEL Apply 2 g topically 4 (four) times daily. Patient taking differently: Apply 2 g topically as needed. 07/02/18   Vernetta Lonni GRADE, MD  escitalopram  (LEXAPRO ) 5 MG tablet Take 5 mg by mouth daily. 08/31/22   [provider]  gabapentin (NEURONTIN) 100 MG capsule Take 100 mg by mouth daily. Patient not taking: Reported on 04/20/2023 04/19/23   [provider]  hydrochlorothiazide (HYDRODIURIL) 25 MG tablet 1 tablet in the morning Orally Once a day 04/04/24   [provider]  HYDROcodone -acetaminophen   (NORCO/VICODIN) 5-325 MG tablet Take 1-2 tablets by mouth every 6 (six) hours as needed for moderate pain. 07/02/18   Vernetta Lonni GRADE, MD  hydrOXYzine  (ATARAX /VISTARIL ) 50 MG tablet Take 1  tablet (50 mg total) by mouth 3 (three) times daily as needed. 01/02/17   Aneita Gwendlyn DASEN, MD  hyoscyamine  (LEVSIN  SL) 0.125 MG SL tablet Place 1 tablet (0.125 mg total) under the tongue every 6 (six) hours as needed. 04/20/23   Beather Delon Gibson, PA  LORazepam  (ATIVAN ) 0.5 MG tablet Take 1 tablet by mouth as needed. 10/10/16   [provider]  metFORMIN  (GLUCOPHAGE ) 1000 MG tablet Take 1,000 mg by mouth 2 (two) times daily. 02/21/16   [provider]  methocarbamol  (ROBAXIN ) 500 MG tablet Take 1 tablet (500 mg total) by mouth 3 (three) times daily as needed (muscle spasm/pain). 03/07/22   Bernard Drivers, MD  omeprazole  (PRILOSEC) 20 MG capsule Take 20 mg by mouth daily.    [provider]  ramipril  (ALTACE ) 10 MG capsule Take 10 mg by mouth daily.      [provider]    Physical Exam: Vitals:   06/16/24 1730 06/16/24 1800 06/16/24 1844 06/16/24 1900  BP: (!) 163/70 (!) 169/65 (!) 172/60 (!) 146/49  Pulse: 69 74 71 67  Resp: 17 14 18 18   Temp:   98.4 F (36.9 C) 99.4 F (37.4 C)  TempSrc:    Oral  SpO2: 95% 93% 92% 93%   Physical Exam:  General: No acute distress, well developed, well nourished HEENT: Normocephalic, atraumatic, PERRL Cardiovascular: Normal rate and rhythm. Murmur. Distal pulses intact. Pulmonary: Normal pulmonary effort, normal breath sounds Gastrointestinal: Nondistended abdomen, soft, non-tender, hypoactive bowel sounds Musculoskeletal:Normal ROM, no lower ext edema Skin: Skin is warm and dry. Neuro: No focal deficits noted, AAOx3. PSYCH: Attentive and cooperative  Data Reviewed:  Results for orders placed or performed during the hospital encounter of 06/16/24 (from the past 24 hours)  Urinalysis, w/ Reflex to Culture (Infection  Suspected) -Urine, Clean Catch     Status: Abnormal   Collection Time: 06/16/24 10:46 AM  Result Value Ref Range   Specimen Source URINE, CLEAN CATCH    Color, Urine COLORLESS (A) YELLOW   APPearance CLEAR CLEAR   Specific Gravity, Urine <1.005 (L) 1.005 - 1.030   pH 5.5 5.0 - 8.0   Glucose, UA NEGATIVE NEGATIVE mg/dL   Hgb urine dipstick NEGATIVE NEGATIVE   Bilirubin Urine NEGATIVE NEGATIVE   Ketones, ur NEGATIVE NEGATIVE mg/dL   Protein, ur NEGATIVE NEGATIVE mg/dL   Nitrite NEGATIVE NEGATIVE   Leukocytes,Ua NEGATIVE NEGATIVE   RBC / HPF 0-5 0 - 5 RBC/hpf   WBC, UA 0-5 0 - 5 WBC/hpf   Bacteria, UA NONE SEEN NONE SEEN   Squamous Epithelial / HPF 0-5 0 - 5 /HPF  Resp panel by RT-PCR (RSV, Flu A&B, Covid) Anterior Nasal Swab     Status: None   Collection Time: 06/16/24 10:55 AM   Specimen: Anterior Nasal Swab  Result Value Ref Range   SARS Coronavirus 2 by RT PCR NEGATIVE NEGATIVE   Influenza A by PCR NEGATIVE NEGATIVE   Influenza B by PCR NEGATIVE NEGATIVE   Resp Syncytial Virus by PCR NEGATIVE NEGATIVE  CBC with Differential     Status: Abnormal   Collection Time: 06/16/24 11:04 AM  Result Value Ref Range   WBC 7.3 4.0 - 10.5 K/uL   RBC 3.79 (L) 3.87 - 5.11 MIL/uL   Hemoglobin 11.5 (L) 12.0 - 15.0 g/dL   HCT 64.4 (L) 63.9 - 53.9 %   MCV 93.7 80.0 - 100.0 fL   MCH 30.3 26.0 - 34.0 pg   MCHC 32.4 30.0 -  36.0 g/dL   RDW 84.8 88.4 - 84.4 %   Platelets 275 150 - 400 K/uL   nRBC 0.0 0.0 - 0.2 %   Neutrophils Relative % 68 %   Neutro Abs 5.0 1.7 - 7.7 K/uL   Lymphocytes Relative 25 %   Lymphs Abs 1.8 0.7 - 4.0 K/uL   Monocytes Relative 6 %   Monocytes Absolute 0.4 0.1 - 1.0 K/uL   Eosinophils Relative 0 %   Eosinophils Absolute 0.0 0.0 - 0.5 K/uL   Basophils Relative 1 %   Basophils Absolute 0.1 0.0 - 0.1 K/uL   Immature Granulocytes 0 %   Abs Immature Granulocytes 0.03 0.00 - 0.07 K/uL  Comprehensive metabolic panel     Status: Abnormal   Collection Time: 06/16/24 11:04  AM  Result Value Ref Range   Sodium 136 135 - 145 mmol/L   Potassium 4.3 3.5 - 5.1 mmol/L   Chloride 101 98 - 111 mmol/L   CO2 19 (L) 22 - 32 mmol/L   Glucose, Bld 150 (H) 70 - 99 mg/dL   BUN 11 8 - 23 mg/dL   Creatinine, Ser 9.16 0.44 - 1.00 mg/dL   Calcium  9.9 8.9 - 10.3 mg/dL   Total Protein 6.9 6.5 - 8.1 g/dL   Albumin 4.1 3.5 - 5.0 g/dL   AST 24 15 - 41 U/L   ALT 10 0 - 44 U/L   Alkaline Phosphatase 88 38 - 126 U/L   Total Bilirubin 0.3 0.0 - 1.2 mg/dL   GFR, Estimated >39 >39 mL/min   Anion gap 16 (H) 5 - 15  Lipase, blood     Status: None   Collection Time: 06/16/24 11:04 AM  Result Value Ref Range   Lipase 12 11 - 51 U/L  Troponin T, High Sensitivity     Status: None   Collection Time: 06/16/24 11:04 AM  Result Value Ref Range   Troponin T High Sensitivity 16 0 - 19 ng/L  Troponin T, High Sensitivity     Status: None   Collection Time: 06/16/24  1:53 PM  Result Value Ref Range   Troponin T High Sensitivity 19 0 - 19 ng/L  Basic metabolic panel     Status: Abnormal   Collection Time: 06/16/24  1:53 PM  Result Value Ref Range   Sodium 137 135 - 145 mmol/L   Potassium 4.0 3.5 - 5.1 mmol/L   Chloride 102 98 - 111 mmol/L   CO2 20 (L) 22 - 32 mmol/L   Glucose, Bld 45 (L) 70 - 99 mg/dL   BUN 9 8 - 23 mg/dL   Creatinine, Ser 9.22 0.44 - 1.00 mg/dL   Calcium  9.5 8.9 - 10.3 mg/dL   GFR, Estimated >39 >39 mL/min   Anion gap 15 5 - 15  D-dimer, quantitative     Status: Abnormal   Collection Time: 06/16/24  2:52 PM  Result Value Ref Range   D-Dimer, Quant 0.79 (H) 0.00 - 0.50 ug/mL-FEU  Occult blood card to lab, stool     Status: None   Collection Time: 06/16/24  3:53 PM  Result Value Ref Range   Fecal Occult Bld NEGATIVE NEGATIVE  POC CBG, ED     Status: Abnormal   Collection Time: 06/16/24  4:07 PM  Result Value Ref Range   Glucose-Capillary 30 (LL) 70 - 99 mg/dL   Comment 1 A   CBG monitoring, ED     Status: Abnormal   Collection Time: 06/16/24  4:30  PM  Result  Value Ref Range   Glucose-Capillary 53 (L) 70 - 99 mg/dL  CBG monitoring, ED     Status: None   Collection Time: 06/16/24  4:57 PM  Result Value Ref Range   Glucose-Capillary 89 70 - 99 mg/dL    Assessment and Plan: Hypoglycemia in the 89 year old with type 2 diabetes mellitus- - holding metformin .  At discharge consider decreasing dose to qAM instead of twice daily - D5 IVF  2. Metabolic acidosis/ dehydration / wooziness  - IVF overnight  -Discontinue HCTZ - Check orthostatics in a.m. - Ambulate in a.m.  3.  Abdominal tenderness -resolved CT of abdomen pelvis negative for acute findings.  Accurate med list not yet completed.   Advance Care Planning:   Code Status: Full Code the patient was very disturbed by this line of questioning.  She wanted to know why I was asking.  I told her this was routine.  I eventually abandoned the questions.  She will be full code by default. She says her her sister is her primary caregiver so her surrogate decision maker would she would choose either be her sister or her son.    Consults: none  Family Communication: none  Severity of Illness: The appropriate patient status for this patient is OBSERVATION. Observation status is judged to be reasonable and necessary in order to provide the required intensity of service to ensure the patient's safety. The patient's presenting symptoms, physical exam findings, and initial radiographic and laboratory data in the context of their medical condition is felt to place them at decreased risk for further clinical deterioration. Furthermore, it is anticipated that the patient will be medically stable for discharge from the hospital within 2 midnights of admission.   Author: ARTHEA CHILD, MD 06/16/2024 7:46 PM  For on call review [email or web address].

## 2024-06-16 NOTE — ED Notes (Signed)
 Kim with [facility] called for transport

## 2024-06-17 DIAGNOSIS — D638 Anemia in other chronic diseases classified elsewhere: Secondary | ICD-10-CM

## 2024-06-17 DIAGNOSIS — I10 Essential (primary) hypertension: Secondary | ICD-10-CM | POA: Diagnosis not present

## 2024-06-17 DIAGNOSIS — R42 Dizziness and giddiness: Secondary | ICD-10-CM | POA: Diagnosis not present

## 2024-06-17 DIAGNOSIS — D649 Anemia, unspecified: Secondary | ICD-10-CM

## 2024-06-17 DIAGNOSIS — E162 Hypoglycemia, unspecified: Secondary | ICD-10-CM | POA: Diagnosis not present

## 2024-06-17 DIAGNOSIS — K219 Gastro-esophageal reflux disease without esophagitis: Secondary | ICD-10-CM

## 2024-06-17 DIAGNOSIS — E860 Dehydration: Secondary | ICD-10-CM | POA: Diagnosis not present

## 2024-06-17 LAB — IRON AND TIBC
Iron: 21 ug/dL — ABNORMAL LOW (ref 28–170)
Saturation Ratios: 7 % — ABNORMAL LOW (ref 10.4–31.8)
TIBC: 309 ug/dL (ref 250–450)
UIBC: 289 ug/dL

## 2024-06-17 LAB — BASIC METABOLIC PANEL WITH GFR
Anion gap: 12 (ref 5–15)
BUN: 7 mg/dL — ABNORMAL LOW (ref 8–23)
CO2: 27 mmol/L (ref 22–32)
Calcium: 9.4 mg/dL (ref 8.9–10.3)
Chloride: 107 mmol/L (ref 98–111)
Creatinine, Ser: 0.84 mg/dL (ref 0.44–1.00)
GFR, Estimated: >60 mL/min (ref >60)
Glucose, Bld: 63 mg/dL — ABNORMAL LOW (ref 70–99)
Potassium: 4.9 mmol/L (ref 3.5–5.1)
Sodium: 145 mmol/L (ref 135–145)

## 2024-06-17 LAB — CBC
HCT: 32.8 % — ABNORMAL LOW (ref 36.0–46.0)
Hemoglobin: 10.2 g/dL — ABNORMAL LOW (ref 12.0–15.0)
MCH: 29.9 pg (ref 26.0–34.0)
MCHC: 31.1 g/dL (ref 30.0–36.0)
MCV: 96.2 fL (ref 80.0–100.0)
Platelets: 262 K/uL (ref 150–400)
RBC: 3.41 MIL/uL — ABNORMAL LOW (ref 3.87–5.11)
RDW: 15.2 % (ref 11.5–15.5)
WBC: 6.9 K/uL (ref 4.0–10.5)
nRBC: 0 % (ref 0.0–0.2)

## 2024-06-17 LAB — VITAMIN B12: Vitamin B-12: 562 pg/mL (ref 180–914)

## 2024-06-17 LAB — GLUCOSE, CAPILLARY
Glucose-Capillary: 73 mg/dL (ref 70–99)
Glucose-Capillary: 184 mg/dL — ABNORMAL HIGH (ref 70–99)
Glucose-Capillary: 178 mg/dL — ABNORMAL HIGH (ref 70–99)
Glucose-Capillary: 206 mg/dL — ABNORMAL HIGH (ref 70–99)
Glucose-Capillary: 150 mg/dL — ABNORMAL HIGH (ref 70–99)

## 2024-06-17 LAB — FERRITIN: Ferritin: 10 ng/mL — ABNORMAL LOW (ref 11–307)

## 2024-06-17 LAB — T4, FREE: Free T4: 0.75 ng/dL (ref 0.61–1.12)

## 2024-06-17 LAB — HEMOGLOBIN A1C
Hgb A1c MFr Bld: 6.5 % — ABNORMAL HIGH (ref 4.8–5.6)
Mean Plasma Glucose: 139.85 mg/dL

## 2024-06-17 LAB — FOLATE: Folate: >20 ng/mL (ref >5.9)

## 2024-06-17 LAB — TSH: TSH: 9.61 u[IU]/mL — ABNORMAL HIGH (ref 0.350–4.500)

## 2024-06-17 LAB — MAGNESIUM: Magnesium: 1.6 mg/dL — ABNORMAL LOW (ref 1.7–2.4)

## 2024-06-17 MED ORDER — METHOCARBAMOL 500 MG PO TABS: 500.0000 mg | ORAL_TABLET | Freq: Three times a day (TID) | ORAL | Status: DC | PRN

## 2024-06-17 MED ORDER — MAGNESIUM SULFATE 2 GM/50ML IV SOLN
2.0000 g | Freq: Once | INTRAVENOUS | Status: AC
  Administered 2024-06-17: 2 g via INTRAVENOUS
  Filled 2024-06-17: qty 50

## 2024-06-17 MED ORDER — DICLOFENAC SODIUM 1 % EX GEL: 2.0000 g | CUTANEOUS | Status: DC | PRN

## 2024-06-17 MED ORDER — HYOSCYAMINE SULFATE 0.125 MG SL SUBL: 0.1250 mg | SUBLINGUAL_TABLET | Freq: Four times a day (QID) | SUBLINGUAL | Status: DC | PRN

## 2024-06-17 MED ORDER — AMLODIPINE BESYLATE 5 MG PO TABS
5.0000 mg | ORAL_TABLET | Freq: Every day | ORAL | Status: DC
  Administered 2024-06-18: 5 mg via ORAL
  Filled 2024-06-17: qty 1

## 2024-06-17 MED ORDER — B COMPLEX-C PO TABS: 1.0000 | ORAL_TABLET | Freq: Every day | ORAL | Status: DC

## 2024-06-17 MED ORDER — SODIUM CHLORIDE 0.45 % IV SOLN: INTRAVENOUS | Status: DC

## 2024-06-17 MED ORDER — ASPIRIN 81 MG PO TBEC
81.0000 mg | DELAYED_RELEASE_TABLET | Freq: Every day | ORAL | Status: DC
  Administered 2024-06-18: 81 mg via ORAL
  Filled 2024-06-17: qty 1

## 2024-06-17 MED ORDER — LORAZEPAM 0.5 MG PO TABS: 0.5000 mg | ORAL_TABLET | ORAL | Status: DC | PRN

## 2024-06-17 MED ORDER — DEXTROSE IN LACTATED RINGERS 5 % IV SOLN: INTRAVENOUS | Status: DC

## 2024-06-17 MED ORDER — ALLOPURINOL 300 MG PO TABS
300.0000 mg | ORAL_TABLET | Freq: Every day | ORAL | Status: DC
  Administered 2024-06-18: 300 mg via ORAL
  Filled 2024-06-17: qty 1

## 2024-06-17 MED ORDER — OYSTER SHELL CALCIUM/D3 500-5 MG-MCG PO TABS: 1.0000 | ORAL_TABLET | Freq: Every day | ORAL | Status: DC

## 2024-06-17 MED ORDER — PANTOPRAZOLE SODIUM 40 MG PO TBEC
40.0000 mg | DELAYED_RELEASE_TABLET | Freq: Every day | ORAL | Status: DC
  Administered 2024-06-18: 40 mg via ORAL
  Filled 2024-06-17: qty 1

## 2024-06-17 MED ORDER — ESCITALOPRAM OXALATE 10 MG PO TABS: 5.0000 mg | ORAL_TABLET | Freq: Every day | ORAL | Status: DC

## 2024-06-17 NOTE — Plan of Care (Signed)

## 2024-06-17 NOTE — Evaluation (Signed)
 Physical Therapy Evaluation Patient Details Name: [name] MRN: [id number] DOB: 04/22/1928 Today's Date: 06/17/2024  History of Present Illness  89 yo female admitted with hypoglycemia. Hx of spinal fusion, TKA, DM, melanoma, gout, OA, shingles, L foot 1st ray surery, DJD, depression  Clinical Impression  On eval, pt was CGA for mobility. She walked ~100 feet-with and without an assistive device. Pt tolerated activity well. She denied dizziness throughout session. She reports that a cane was recommended on last admission but she has not received one. Will recommend HHPT f/u. Will continue to follow pt during this hospital stay.         If plan is discharge home, recommend the following:     Can travel by private vehicle        Equipment Recommendations [place] (pt is requesting-stated she never received cane that was recommended on last admission)  Recommendations for Other Services  OT consult    Functional Status Assessment Patient has had a recent decline in their functional status and demonstrates the ability to make significant improvements in function in a reasonable and predictable amount of time.     Precautions / Restrictions Precautions Precautions: Fall Restrictions Weight Bearing Restrictions Per Provider Order: No      Mobility  Bed Mobility Overal bed mobility: Modified Independent                  Transfers Overall transfer level: Needs assistance Equipment used: Rolling walker (2 wheels), None Transfers: Sit to/from Stand Sit to Stand: Supervision                Ambulation/Gait Ambulation/Gait assistance: Contact guard assist Gait Distance (Feet): 100 Feet Assistive device: Rolling walker (2 wheels), None Gait Pattern/deviations: Step-through pattern, Decreased stride length, Decreased step length - left, Decreased step length - right       General Gait Details: walked ~50 feet with RW then ~50 feet without a device. Mild  unsteadiness and decreaesd step/stride length when walking without an assistive device. Pt denied dizziness. Tolerated distance well. She is requesting a cane.  Stairs            Wheelchair Mobility     Tilt Bed    Modified Rankin (Stroke Patients Only)       Balance Overall balance assessment: Needs assistance         Standing balance support: Bilateral upper extremity supported, During functional activity, No upper extremity supported Standing balance-Leahy Scale: Poor                               Pertinent Vitals/Pain Pain Assessment Pain Assessment: No/denies pain    Home Living Family/patient expects to be discharged to:: Private residence Living Arrangements: Alone   Type of Home: House Home Access: Stairs to enter Entrance Stairs-Rails: Right Entrance Stairs-Number of Steps: 6   Home Layout: One level;Laundry or work area in [employer] Equipment: Agricultural consultant (2 wheels);Cane - single point;Rollator (4 wheels);Shower seat;BSC/3in1      Prior Function Prior Level of Function : History of Falls (last six months)             Mobility Comments: pt reports she used a cane PRN-cane is borrowed?       Extremity/Trunk Assessment   Upper Extremity Assessment Upper Extremity Assessment: Defer to OT evaluation    Lower Extremity Assessment Lower Extremity Assessment: Generalized weakness    Cervical / Trunk Assessment Cervical /  Trunk Assessment: Kyphotic  Communication   Communication Communication: Impaired Factors Affecting Communication: Hearing impaired    Cognition Arousal: Alert Behavior During Therapy: WFL for tasks assessed/performed   PT - Cognitive impairments: No apparent impairments                         Following commands: Intact       Cueing       General Comments      Exercises     Assessment/Plan    PT Assessment Patient needs continued PT services  PT Problem List Decreased  strength;Decreased activity tolerance;Decreased balance;Decreased mobility;Decreased knowledge of use of DME       PT Treatment Interventions DME instruction;Gait training;Functional mobility training;Therapeutic activities;Therapeutic exercise;Patient/family education;Balance training    PT Goals (Current goals can be found in the Care Plan section)  Acute Rehab PT Goals Patient Stated Goal: home PT Goal Formulation: With patient Time For Goal Achievement: 07/01/24 Potential to Achieve Goals: Good    Frequency Min 3X/week     Co-evaluation               AM-PAC PT 6 Clicks Mobility  Outcome Measure Help needed turning from your back to your side while in a flat bed without using bedrails?: None Help needed moving from lying on your back to sitting on the side of a flat bed without using bedrails?: None Help needed moving to and from a bed to a chair (including a wheelchair)?: None Help needed standing up from a chair using your arms (e.g., wheelchair or bedside chair)?: None Help needed to walk in hospital room?: A Little Help needed climbing 3-5 steps with a railing? : A Little 6 Click Score: 22    End of Session Equipment Utilized During Treatment: Gait belt Activity Tolerance: Patient tolerated treatment well Patient left: in bed;with call bell/phone within reach;with bed alarm set   PT Visit Diagnosis: Difficulty in walking, not elsewhere classified (R26.2)    Time: 9097-9074 PT Time Calculation (min) (ACUTE ONLY): 23 min   Charges:   PT Evaluation $PT Eval Low Complexity: 1 Low PT Treatments $Gait Training: 8-22 mins PT General Charges $$ ACUTE PT VISIT: 1 Visit           Dannial SQUIBB, PT Acute Rehabilitation  Office: [phone]

## 2024-06-17 NOTE — Progress Notes (Signed)
 PROGRESS NOTE    [name]  FMW:[id number] DOB: January 16, 1928 DOA: 06/16/2024 PCP: Corlis Pagan, NP    Chief Complaint  Patient presents with   Emesis    Brief Narrative:  Patient is a pleasant 89 year old female with history of diabetes mellitus type 2, hypertension presented to the ED not feeling well with complaints of feeling woozy.  Patient noted to have worked in her garden the day prior to presentation, after which she felt a little woozy before going to bed, woke up on the morning of admission still feeling woozy and subsequently presented to the ED.  CT abdomen and pelvis done negative for any acute abnormalities.  Patient was noted to have a blood glucose of 45 which dropped further down to as low as 30 in the ED.  Patient placed on D5 normal saline and admitted for further evaluation and management.   Assessment & Plan:   Principal Problem:   Hypoglycemia Active Problems:   GERD (gastroesophageal reflux disease)   Dizziness   HTN (hypertension)   DM (diabetes mellitus) (HCC)   Dehydration   Hypomagnesemia   Anemia  #1 hypoglycemia/well-controlled diabetes mellitus type 2 -Patient 89 year old female with type 2 diabetes notably on metformin  twice daily presenting to the ED with symptomatic hypoglycemia with feelings of wooziness. - Patient with no signs or symptoms of infection. - Urinalysis nitrite negative, leukocytes negative. - Patient with no respiratory or pulmonary symptoms.  Patient afebrile.  Patient with no leukocytosis. - Hemoglobin A1c 6.5. - Continue to hold oral hypoglycemic medications of metformin . - CBG this morning noted at 73 with CBG of 184 around lunchtime. - Patient on D5 NS. - Will discontinue D5 NS and monitor CBGs. -Check CBGs every 4 hours for the next 24 hours and if controlled could change to [facility] and at bedtime - Will likely not resume metformin  on discharge until follow-up with PCP.  2.  Metabolic acidosis/dehydration - Improving  with hydration. - Continue to hold HCTZ. - Orthostatics were checked on admission which were negative. - PT OT.  3.  Abdominal tenderness -Resolved. - CT abdomen and pelvis with no acute findings.  4.  GERD -PPI.  5.  Hypertension -Resume home regimen Norvasc  5 mg daily. - Hold HCTZ and Altace .  6.  Anemia -Patient with no overt bleeding. - Check an anemia panel. - Follow H&H. - Transfusion threshold hemoglobin < 7.  7.  Dehydration -IV fluids.  8.  Hypomagnesemia -Magnesium  at 1.6. - Magnesium  sulfate 2 g IV x 1. - Repeat labs in the AM.    DVT prophylaxis: Lovenox  Code Status: Full Family Communication: Updated patient and sister at bedside. Disposition: Likely home when clinically improved, blood glucose levels have stabilized hopefully in the next 24 to 48 hours  Status is: Observation The patient remains OBS appropriate and will d/c before 2 midnights.   Consultants:  None  Procedures:  CT abdomen and pelvis 06/16/2024   Antimicrobials:  Anti-infectives (From admission, onward)    None         Subjective: Patient lying in bed.  Denies any chest pain or shortness of breath.  Denies any abdominal pain.  Feels lightheadedness/wooziness has improved.  Tolerating current diet.  Eager to go home.  Sister at bedside.  Objective: Vitals:   06/17/24 1254 06/17/24 1256 06/17/24 1301 06/17/24 1341  BP: (!) 155/65 (!) 176/65 (!) 187/65 (!) 160/76  Pulse: 76 79 76 67  Resp: 16  (!) 24 16  Temp:  98.4 F (36.9 C)  TempSrc:      SpO2: 90% 91% 92% 94%    Intake/Output Summary (Last 24 hours) at 06/17/2024 1643 Last data filed at 06/17/2024 1004 Gross per 24 hour  Intake 724.32 ml  Output --  Net 724.32 ml   There were no vitals filed for this visit.  Examination:  General exam: Appears calm and comfortable.  Dry mucous membranes. Respiratory system: Clear to auscultation.  No wheezes, no crackles, no rhonchi.  Fair air movement.  Speaking in full  sentences.  Respiratory effort normal. Cardiovascular system: S1 & S2 heard, RRR. No JVD, murmurs, rubs, gallops or clicks. No pedal edema. Gastrointestinal system: Abdomen is nondistended, soft and nontender. No organomegaly or masses felt. Normal bowel sounds heard. Central nervous system: Alert and oriented.  Moving extremities spontaneously.  No focal neurological deficits. Extremities: Symmetric 5 x 5 power. Skin: No rashes, lesions or ulcers Psychiatry: Judgement and insight appear normal. Mood & affect appropriate.     Data Reviewed: I have personally reviewed following labs and imaging studies  CBC: Recent Labs  Lab 06/16/24 1104 06/17/24 0500  WBC 7.3 6.9  NEUTROABS 5.0  --   HGB 11.5* 10.2*  HCT 35.5* 32.8*  MCV 93.7 96.2  PLT 275 262    Basic Metabolic Panel: Recent Labs  Lab 06/16/24 1104 06/16/24 1353 06/17/24 0500  NA 136 137 145  K 4.3 4.0 4.9  CL 101 102 107  CO2 19* 20* 27  GLUCOSE 150* 45* 63*  BUN 11 9 7*  CREATININE 0.83 0.77 0.84  CALCIUM  9.9 9.5 9.4  MG  --   --  1.6*    GFR: CrCl cannot be calculated (Unknown ideal weight.).  Liver Function Tests: Recent Labs  Lab 06/16/24 1104  AST 24  ALT 10  ALKPHOS 88  BILITOT 0.3  PROT 6.9  ALBUMIN 4.1    CBG: Recent Labs  Lab 06/16/24 1630 06/16/24 1657 06/17/24 0723 06/17/24 1133 06/17/24 1539  GLUCAP 53* 89 73 184* 178*     Recent Results (from the past 240 hours)  Resp panel by RT-PCR (RSV, Flu A&B, Covid) Anterior Nasal Swab     Status: None   Collection Time: 06/16/24 10:55 AM   Specimen: Anterior Nasal Swab  Result Value Ref Range Status   SARS Coronavirus 2 by RT PCR NEGATIVE NEGATIVE Final    Comment: (NOTE) SARS-CoV-2 target nucleic acids are NOT DETECTED.  The SARS-CoV-2 RNA is generally detectable in upper respiratory specimens during the acute phase of infection. The lowest concentration of SARS-CoV-2 viral copies this assay can detect is 138 copies/mL. A  negative result does not preclude SARS-Cov-2 infection and should not be used as the sole basis for treatment or other patient management decisions. A negative result may occur with  improper specimen collection/handling, submission of specimen other than nasopharyngeal swab, presence of viral mutation(s) within the areas targeted by this assay, and inadequate number of viral copies(<138 copies/mL). A negative result must be combined with clinical observations, patient history, and epidemiological information. The expected result is Negative.  Fact Sheet for Patients:  BloggerCourse.com  Fact Sheet for Healthcare Providers:  SeriousBroker.it  This test is no t yet approved or cleared by the [place]  FDA and  has been authorized for detection and/or diagnosis of SARS-CoV-2 by FDA under an Emergency Use Authorization (EUA). This EUA will remain  in effect (meaning this test can be used) for the duration of the COVID-19 declaration under Section  564(b)(1) of the Act, 21 U.S.C.section 360bbb-3(b)(1), unless the authorization is terminated  or revoked sooner.       Influenza A by PCR NEGATIVE NEGATIVE Final   Influenza B by PCR NEGATIVE NEGATIVE Final    Comment: (NOTE) The Xpert Xpress SARS-CoV-2/FLU/RSV plus assay is intended as an aid in the diagnosis of influenza from Nasopharyngeal swab specimens and should not be used as a sole basis for treatment. Nasal washings and aspirates are unacceptable for Xpert Xpress SARS-CoV-2/FLU/RSV testing.  Fact Sheet for Patients: BloggerCourse.com  Fact Sheet for Healthcare Providers: SeriousBroker.it  This test is not yet approved or cleared by the [place]  FDA and has been authorized for detection and/or diagnosis of SARS-CoV-2 by FDA under an Emergency Use Authorization (EUA). This EUA will remain in effect (meaning this test can  be used) for the duration of the COVID-19 declaration under Section 564(b)(1) of the Act, 21 U.S.C. section 360bbb-3(b)(1), unless the authorization is terminated or revoked.     Resp Syncytial Virus by PCR NEGATIVE NEGATIVE Final    Comment: (NOTE) Fact Sheet for Patients: BloggerCourse.com  Fact Sheet for Healthcare Providers: SeriousBroker.it  This test is not yet approved or cleared by the [place]  FDA and has been authorized for detection and/or diagnosis of SARS-CoV-2 by FDA under an Emergency Use Authorization (EUA). This EUA will remain in effect (meaning this test can be used) for the duration of the COVID-19 declaration under Section 564(b)(1) of the Act, 21 U.S.C. section 360bbb-3(b)(1), unless the authorization is terminated or revoked.  Performed at [facility], [address] [place], [place] [address]          Radiology Studies: CT ABDOMEN PELVIS W CONTRAST Result Date: 06/16/2024 EXAM: CT ABDOMEN AND PELVIS WITH CONTRAST 06/16/2024 12:59:35 PM TECHNIQUE: CT of the abdomen and pelvis was performed with the administration of 85mL iohexol  (OMNIPAQUE ) 300 MG/ML solution. Multiplanar reformatted images are provided for review. Automated exposure control, iterative reconstruction, and/or weight-based adjustment of the mA/kV was utilized to reduce the radiation dose to as low as reasonably achievable. COMPARISON: None available. CLINICAL HISTORY: Abdominal pain, acute, nonlocalized; LLQ abdominal pain. Triage note: C/o n/v/d since yesterday with generalized body aches. Denies fevers. FINDINGS: LOWER CHEST: Dense calcifications are present at the mitral valve annulus. Coronary artery calcifications are present. A small hiatal hernia is present. LIVER: Diffuse fatty infiltration of the liver is noted. GALLBLADDER AND BILE DUCTS: Cholecystectomy. SPLEEN: Normal size. No focal lesion. PANCREAS: Advanced  atrophy is present in the pancreas. Pancreatic duct is mildly dilated measuring up to 8 mm in the distal body. No obstructing lesion is present. ADRENAL GLANDS: Normal appearance. No mass. KIDNEYS, URETERS AND BLADDER: Renal scarring is present with scattered areas of parenchymal thinning. No stones in the kidneys or ureters. No hydronephrosis. No perinephric or periureteral stranding. Urinary bladder is unremarkable. GI AND BOWEL: Stomach demonstrates no acute abnormality. There is no bowel obstruction. No bowel wall thickening. PERITONEUM AND RETROPERITONEUM: No ascites. No free air. VASCULATURE: Extensive atherosclerotic calcifications are present in the aorta and branch vessels. No aneurysm is present. LYMPH NODES: No lymphadenopathy. REPRODUCTIVE ORGANS: Hysterectomy. BONES AND SOFT TISSUES: Thoracolumbar fusion extends T10-L4. Degenerative changes are present at the SI joints bilaterally. No acute osseous abnormality. No focal soft tissue abnormality. IMPRESSION: 1. No acute findings in the abdomen or pelvis related to the clinical history of abdominal pain. 2. Other, non-acute and/or normal findings as above. Electronically signed by: Lonni Necessary MD 06/16/2024 01:10 PM EDT RP  Workstation: [id number]        Scheduled Meds:  allopurinol   300 mg Oral Daily   amLODipine   5 mg Oral Daily   aspirin  EC  81 mg Oral Daily   Calcium  Carb-Cholecalciferol  1 tablet Oral Q1500   escitalopram   5 mg Oral Daily   heparin   5,000 Units Subcutaneous Q8H   pantoprazole   40 mg Oral Daily   Vitamin-B Complex   Oral Q1500   Continuous Infusions:  sodium chloride        LOS: 0 days    Time spent: 40 minutes    Toribio Hummer, MD [facility]   To contact the attending provider between 7A-7P or the covering provider during after hours 7P-7A, please log into the web site [email or web address] and access using universal [place] password for that web site. If you do not have the password, please  call the hospital operator.  06/17/2024, 4:43 PM

## 2024-06-17 NOTE — Plan of Care (Signed)
  Problem: Clinical Measurements: Goal: Diagnostic test results will improve Outcome: Progressing   Problem: Nutrition: Goal: Adequate nutrition will be maintained Outcome: Progressing   Problem: Coping: Goal: Level of anxiety will decrease Outcome: Progressing   Problem: Education: Goal: Ability to describe self-care measures that may prevent or decrease complications (Diabetes Survival Skills Education) will improve Outcome: Progressing   Problem: Coping: Goal: Ability to adjust to condition or change in health will improve Outcome: Progressing   Problem: Nutritional: Goal: Maintenance of adequate nutrition will improve Outcome: Progressing

## 2024-06-17 NOTE — Evaluation (Signed)
 Occupational Therapy Evaluation Patient Details Name: [name] MRN: [id number] DOB: [birth year] Today's Date: 06/17/2024   History of Present Illness   89 yr old female admitted with hypoglycemia. PMH: spinal fusion, TKA, DM, melanoma, gout, OA, shingles, L foot 1st ray surery, DJD, depression     Clinical Impressions The pt completed all assessed tasks with distant supervision or better. She appears to be at or very near to his baseline level of functioning for self-care management. She does not require further OT services in the acute care setting. OT will sign off. The pt declines home health therapy services.      If plan is discharge home, recommend the following:   Direct supervision/assist for financial management;Assist for transportation     Functional Status Assessment   Patient has not had a recent decline in their functional status     Equipment Recommendations   Tub/shower seat     Recommendations for Other Services         Precautions/Restrictions   Restrictions Weight Bearing Restrictions Per Provider Order: No     Mobility Bed Mobility Overal bed mobility: Modified Independent Bed Mobility: Supine to Sit     Supine to sit: Modified independent (Device/Increase time)          Transfers Overall transfer level: Needs assistance Equipment used: None Transfers: Sit to/from Stand Sit to Stand: Supervision                  Balance     Sitting balance-Leahy Scale: Good       Standing balance-Leahy Scale: Fair         ADL either performed or assessed with clinical judgement   ADL Overall ADL's : At baseline      General ADL Comments: The pt performed all assessed self-care tasks with distant supervision or better, appearing to be at or very near to her baseline level of functioning for ADL management.     Vision   Additional Comments: She reported having baseline vision impairment. She was unable to decipher  the time depicted on the wall clock, whish she reported to be typical of her baseline.            Pertinent Vitals/Pain Pain Assessment Pain Assessment: No/denies pain     Extremity/Trunk Assessment Upper Extremity Assessment Upper Extremity Assessment: Right hand dominant;RUE deficits/detail;LUE deficits/detail RUE Deficits / Details: AROM WFL. Functional grip strength LUE Deficits / Details: AROM WFL. Functional grip strength   Lower Extremity Assessment Lower Extremity Assessment: Overall WFL for tasks assessed     Communication Communication Communication: Impaired Factors Affecting Communication: Hearing impaired   Cognition Arousal: Alert Behavior During Therapy: WFL for tasks assessed/performed          Following commands: Intact                  Home Living Family/patient expects to be discharged to:: Private residence Living Arrangements: Alone Available Help at Discharge: Family;Available PRN/intermittently Type of Home: House Home Access: Stairs to enter [employer] of Steps:  (5-6)   Home Layout: Laundry or work area in basement;Two level Alternate Level Stairs-Number of Steps: main level and basement level; she does not have to access her basement   Bathroom Shower/Tub: Tub/shower unit         Home Equipment: BSC/3in1;Tub bench;Rollator (4 wheels)          Prior Functioning/Environment Prior Level of Function : Independent/Modified Independent;Needs assist  Mobility Comments:  (She did not use an assistive device most of the time for household ambulation.) ADLs Comments:  (She reported being independent with ADLs, light cooking and cleaning. She does not drive, however her sister takes her to run errands, as well as helps her manage bill pay.)            OT Goals(Current goals can be found in the care plan section)   Acute Rehab OT Goals OT Goal Formulation: All assessment and education complete, DC  therapy   OT Frequency:   N/A       AM-PAC OT 6 Clicks Daily Activity     Outcome Measure Help from another person eating meals?: None Help from another person taking care of personal grooming?: None Help from another person toileting, which includes using toliet, bedpan, or urinal?: None Help from another person bathing (including washing, rinsing, drying)?: None Help from another person to put on and taking off regular upper body clothing?: None Help from another person to put on and taking off regular lower body clothing?: None 6 Click Score: 24   End of Session Equipment Utilized During Treatment: Other (comment) (N/A) Nurse Communication: Mobility status  Activity Tolerance: Patient tolerated treatment well Patient left: in bed;with call bell/phone within reach;with family/visitor present  OT Visit Diagnosis: Unsteadiness on feet (R26.81)                Time: 8660-8645 OT Time Calculation (min): 15 min Charges:  OT General Charges $OT Visit: 1 Visit OT Evaluation $OT Eval Low Complexity: 1 Low   [name] JINNY Lesches, OTR/L 06/17/2024, 2:52 PM

## 2024-06-18 ENCOUNTER — Other Ambulatory Visit (HOSPITAL_COMMUNITY): Payer: Self-pay

## 2024-06-18 DIAGNOSIS — E162 Hypoglycemia, unspecified: Secondary | ICD-10-CM | POA: Diagnosis not present

## 2024-06-18 LAB — CBC
HCT: 30.6 % — ABNORMAL LOW (ref 36.0–46.0)
Hemoglobin: 9.7 g/dL — ABNORMAL LOW (ref 12.0–15.0)
MCH: 30.4 pg (ref 26.0–34.0)
MCHC: 31.7 g/dL (ref 30.0–36.0)
MCV: 95.9 fL (ref 80.0–100.0)
Platelets: 235 K/uL (ref 150–400)
RBC: 3.19 MIL/uL — ABNORMAL LOW (ref 3.87–5.11)
RDW: 14.7 % (ref 11.5–15.5)
WBC: 6.9 K/uL (ref 4.0–10.5)
nRBC: 0 % (ref 0.0–0.2)

## 2024-06-18 LAB — GLUCOSE, CAPILLARY
Glucose-Capillary: 172 mg/dL — ABNORMAL HIGH (ref 70–99)
Glucose-Capillary: 151 mg/dL — ABNORMAL HIGH (ref 70–99)
Glucose-Capillary: 209 mg/dL — ABNORMAL HIGH (ref 70–99)

## 2024-06-18 LAB — BASIC METABOLIC PANEL WITH GFR
Anion gap: 10 (ref 5–15)
BUN: 12 mg/dL (ref 8–23)
CO2: 26 mmol/L (ref 22–32)
Calcium: 9.4 mg/dL (ref 8.9–10.3)
Chloride: 103 mmol/L (ref 98–111)
Creatinine, Ser: 0.8 mg/dL (ref 0.44–1.00)
GFR, Estimated: >60 mL/min (ref >60)
Glucose, Bld: 164 mg/dL — ABNORMAL HIGH (ref 70–99)
Potassium: 4.4 mmol/L (ref 3.5–5.1)
Sodium: 139 mmol/L (ref 135–145)

## 2024-06-18 LAB — MAGNESIUM: Magnesium: 1.9 mg/dL (ref 1.7–2.4)

## 2024-06-18 MED ORDER — LORAZEPAM 0.5 MG PO TABS: 0.5000 mg | ORAL_TABLET | Freq: Every day | ORAL | Status: DC | PRN

## 2024-06-18 MED ORDER — HYDROXYZINE HCL 25 MG PO TABS
25.0000 mg | ORAL_TABLET | Freq: Three times a day (TID) | ORAL | 0 refills | Status: AC | PRN
  Filled 2024-06-18: qty 90, 30d supply, fill #0

## 2024-06-18 NOTE — Discharge Summary (Signed)
 Physician Discharge Summary  HAZELEE HARBOLD FMW:[id number] DOB: 1928-01-10 DOA: 06/16/2024  PCP: Corlis Pagan, NP  Admit date: 06/16/2024 Discharge date: 06/18/2024  Admitted From: Home Disposition: Home  Recommendations for Outpatient Follow-up:  Follow up with PCP in 1-2 weeks  Home Health: None Equipment/Devices: None  Discharge Condition: Stable CODE STATUS: Full Diet recommendation: Low-salt low-fat low-carb diet  Brief/Interim Summary: Patient is a pleasant 89 year old female with history of diabetes mellitus type 2, hypertension presented to the ED not feeling well with generalized and nonspecific complaints of feeling woozy/fatigued.  Initial imaging evaluation at intake unremarkable, patient noted to be moderately hypoglycemic.  Upon further investigation patient has history of occasional hypoglycemia when skipping meals.  In light of patient's recurrent symptoms we will discontinue metformin  and adjust other medications as below to reduce risk of recurrent episodes.  Discussed with patient the need for close glucose monitoring over the next week with record of her glucose levels to report to her primary physician to further adjust her medications as appropriate.  At this time she will remain off metformin .  Discharge Diagnoses:  Principal Problem:   Hypoglycemia Active Problems:   GERD (gastroesophageal reflux disease)   Dizziness   HTN (hypertension)   DM (diabetes mellitus) (HCC)   Dehydration   Hypomagnesemia   Anemia   Hypoglycemia -in the setting of diabetes mellitus type 2, resolved - Likely overly/tightly controlled - DC metformin  - BID glucose checks for the next week to report to PCP   Metabolic acidosis/dehydration - Resolved with supportive care   Abdominal tenderness - Resolved; CT abdomen and pelvis with no acute findings. GERD - PPI. Hypertension - Resume home regimen Norvasc  5 mg daily. Anemia - Iron deficiency - recommend increased PO intake as  discussed - supplement recommended if tolerated Dehydration - Resolved. Hypomagnesemia, resolved  Discharge Instructions  Discharge Instructions     Call MD for:  difficulty breathing, headache or visual disturbances   Complete by: As directed    Call MD for:  extreme fatigue   Complete by: As directed    Call MD for:  hives   Complete by: As directed    Call MD for:  persistant dizziness or light-headedness   Complete by: As directed    Call MD for:  persistant nausea and vomiting   Complete by: As directed    Call MD for:  severe uncontrolled pain   Complete by: As directed    Call MD for:  temperature >100.4   Complete by: As directed    Diet - low sodium heart healthy   Complete by: As directed    Increase activity slowly   Complete by: As directed       Allergies as of 06/18/2024       Reactions   Celebrex [celecoxib] Swelling   Acarbose Other (See Comments)   Gas    Amoxicillin  Hives   Amoxicillin -pot Clavulanate Diarrhea   Bisoprolol    Other Reaction(s): fatigue   Diltiazem Other (See Comments)   Headaches   Duloxetine Nausea And Vomiting   Fluoxetine Other (See Comments)   Gi upset    Gabapentin Other (See Comments)   Blurred vision    Lisinopril Cough   Meloxicam Other (See Comments)   Fatigue    Metoprolol Dermatitis   Pregabalin  Other (See Comments)   Fatigue    Sitagliptin Other (See Comments)   Crazy thoughts    Warfarin Diarrhea   Morphine Sulfate Nausea Only   Percocet [oxycodone-acetaminophen ] Itching  Robinul  [glycopyrrolate ] Rash        Medication List     STOP taking these medications    gabapentin 100 MG capsule Commonly known as: NEURONTIN   hydrochlorothiazide 25 MG tablet Commonly known as: HYDRODIURIL   HYDROcodone -acetaminophen  5-325 MG tablet Commonly known as: NORCO/VICODIN   metFORMIN  1000 MG tablet Commonly known as: GLUCOPHAGE        TAKE these medications    8 HR Arthritis Pain Relief 650 MG CR  tablet Generic drug: acetaminophen  Take 650 mg by mouth as needed for pain.   allopurinol  300 MG tablet Commonly known as: ZYLOPRIM  Take 300 mg by mouth daily.   amLODipine  5 MG tablet Commonly known as: NORVASC  Take 5 mg by mouth daily.   aspirin  EC 81 MG tablet Take 81 mg by mouth daily.   Calcium  Plus Vitamin D3 600-20 MG-MCG Tabs Generic drug: Calcium  Carb-Cholecalciferol Take 1 tablet by mouth daily in the afternoon.   diclofenac  sodium 1 % Gel Commonly known as: Voltaren  Apply 2 g topically 4 (four) times daily. What changed:  when to take this reasons to take this   escitalopram  5 MG tablet Commonly known as: LEXAPRO  Take 5 mg by mouth daily.   hydrOXYzine  25 MG tablet Commonly known as: ATARAX  Take 1 tablet (25 mg total) by mouth 3 (three) times daily as needed. What changed:  medication strength how much to take   hyoscyamine  0.125 MG SL tablet Commonly known as: LEVSIN  SL Place 1 tablet (0.125 mg total) under the tongue every 6 (six) hours as needed.   LORazepam  0.5 MG tablet Commonly known as: ATIVAN  Take 1 tablet (0.5 mg total) by mouth daily as needed for anxiety. Wean off this medication over the next few weeks (Discuss with PCP) What changed:  when to take this reasons to take this additional instructions   methocarbamol  500 MG tablet Commonly known as: ROBAXIN  Take 1 tablet (500 mg total) by mouth 3 (three) times daily as needed (muscle spasm/pain).   omeprazole  20 MG capsule Commonly known as: PRILOSEC Take 20 mg by mouth daily.   ramipril  10 MG capsule Commonly known as: ALTACE  Take 10 mg by mouth daily.   VITAMIN-B COMPLEX PO Take 1 tablet by mouth daily in the afternoon.        Follow-up Information     Corlis Pagan, NP. Schedule an appointment as soon as possible for a visit in 3 days.   Why: For a recheck Contact information: [address] [place] [address] [phone]                 Allergies  Allergen Reactions   Celebrex [Celecoxib] Swelling   Acarbose Other (See Comments)    Gas    Amoxicillin  Hives   Amoxicillin -Pot Clavulanate Diarrhea   Bisoprolol     Other Reaction(s): fatigue   Diltiazem Other (See Comments)    Headaches   Duloxetine Nausea And Vomiting   Fluoxetine Other (See Comments)    Gi upset    Gabapentin Other (See Comments)    Blurred vision    Lisinopril Cough   Meloxicam Other (See Comments)    Fatigue    Metoprolol Dermatitis   Pregabalin  Other (See Comments)    Fatigue    Sitagliptin Other (See Comments)    Crazy thoughts    Warfarin Diarrhea   Morphine Sulfate Nausea Only   Percocet [Oxycodone-Acetaminophen ] Itching   Robinul  [Glycopyrrolate ] Rash    Consultations: None   Procedures/Studies: CT  ABDOMEN PELVIS W CONTRAST Result Date: 06/16/2024 EXAM: CT ABDOMEN AND PELVIS WITH CONTRAST 06/16/2024 12:59:35 PM TECHNIQUE: CT of the abdomen and pelvis was performed with the administration of 85mL iohexol  (OMNIPAQUE ) 300 MG/ML solution. Multiplanar reformatted images are provided for review. Automated exposure control, iterative reconstruction, and/or weight-based adjustment of the mA/kV was utilized to reduce the radiation dose to as low as reasonably achievable. COMPARISON: None available. CLINICAL HISTORY: Abdominal pain, acute, nonlocalized; LLQ abdominal pain. Triage note: C/o n/v/d since yesterday with generalized body aches. Denies fevers. FINDINGS: LOWER CHEST: Dense calcifications are present at the mitral valve annulus. Coronary artery calcifications are present. A small hiatal hernia is present. LIVER: Diffuse fatty infiltration of the liver is noted. GALLBLADDER AND BILE DUCTS: Cholecystectomy. SPLEEN: Normal size. No focal lesion. PANCREAS: Advanced atrophy is present in the pancreas. Pancreatic duct is mildly dilated measuring up to 8 mm in the distal body. No obstructing lesion is present. ADRENAL GLANDS: Normal appearance.  No mass. KIDNEYS, URETERS AND BLADDER: Renal scarring is present with scattered areas of parenchymal thinning. No stones in the kidneys or ureters. No hydronephrosis. No perinephric or periureteral stranding. Urinary bladder is unremarkable. GI AND BOWEL: Stomach demonstrates no acute abnormality. There is no bowel obstruction. No bowel wall thickening. PERITONEUM AND RETROPERITONEUM: No ascites. No free air. VASCULATURE: Extensive atherosclerotic calcifications are present in the aorta and branch vessels. No aneurysm is present. LYMPH NODES: No lymphadenopathy. REPRODUCTIVE ORGANS: Hysterectomy. BONES AND SOFT TISSUES: Thoracolumbar fusion extends T10-L4. Degenerative changes are present at the SI joints bilaterally. No acute osseous abnormality. No focal soft tissue abnormality. IMPRESSION: 1. No acute findings in the abdomen or pelvis related to the clinical history of abdominal pain. 2. Other, non-acute and/or normal findings as above. Electronically signed by: Lonni Necessary MD 06/16/2024 01:10 PM EDT RP Workstation: [id number]     Subjective: No acute issues/events overnight   Discharge Exam: Vitals:   06/18/24 0409 06/18/24 0909  BP: (!) 143/67 (!) 167/49  Pulse: 71   Resp: 16   Temp: 97.6 F (36.4 C)   SpO2: 98%    Vitals:   06/17/24 1341 06/17/24 2034 06/18/24 0409 06/18/24 0909  BP: (!) 160/76 (!) 143/52 (!) 143/67 (!) 167/49  Pulse: 67 66 71   Resp: 16 17 16    Temp: 98.4 F (36.9 C) 98.6 F (37 C) 97.6 F (36.4 C)   TempSrc:  Oral Oral   SpO2: 94% 91% 98%     General: Pt is alert, awake, not in acute distress Cardiovascular: RRR, S1/S2 +, no rubs, no gallops Respiratory: CTA bilaterally, no wheezing, no rhonchi Abdominal: Soft, NT, ND, bowel sounds + Extremities: no edema, no cyanosis    The results of significant diagnostics from this hospitalization (including imaging, microbiology, ancillary and laboratory) are listed below for reference.      Microbiology: Recent Results (from the past 240 hours)  Resp panel by RT-PCR (RSV, Flu A&B, Covid) Anterior Nasal Swab     Status: None   Collection Time: 06/16/24 10:55 AM   Specimen: Anterior Nasal Swab  Result Value Ref Range Status   SARS Coronavirus 2 by RT PCR NEGATIVE NEGATIVE Final    Comment: (NOTE) SARS-CoV-2 target nucleic acids are NOT DETECTED.  The SARS-CoV-2 RNA is generally detectable in upper respiratory specimens during the acute phase of infection. The lowest concentration of SARS-CoV-2 viral copies this assay can detect is 138 copies/mL. A negative result does not preclude SARS-Cov-2 infection and should not be used as the  sole basis for treatment or other patient management decisions. A negative result may occur with  improper specimen collection/handling, submission of specimen other than nasopharyngeal swab, presence of viral mutation(s) within the areas targeted by this assay, and inadequate number of viral copies(<138 copies/mL). A negative result must be combined with clinical observations, patient history, and epidemiological information. The expected result is Negative.  Fact Sheet for Patients:  BloggerCourse.com  Fact Sheet for Healthcare Providers:  SeriousBroker.it  This test is no t yet approved or cleared by the [place]  FDA and  has been authorized for detection and/or diagnosis of SARS-CoV-2 by FDA under an Emergency Use Authorization (EUA). This EUA will remain  in effect (meaning this test can be used) for the duration of the COVID-19 declaration under Section 564(b)(1) of the Act, 21 U.S.C.section 360bbb-3(b)(1), unless the authorization is terminated  or revoked sooner.       Influenza A by PCR NEGATIVE NEGATIVE Final   Influenza B by PCR NEGATIVE NEGATIVE Final    Comment: (NOTE) The Xpert Xpress SARS-CoV-2/FLU/RSV plus assay is intended as an aid in the diagnosis of  influenza from Nasopharyngeal swab specimens and should not be used as a sole basis for treatment. Nasal washings and aspirates are unacceptable for Xpert Xpress SARS-CoV-2/FLU/RSV testing.  Fact Sheet for Patients: BloggerCourse.com  Fact Sheet for Healthcare Providers: SeriousBroker.it  This test is not yet approved or cleared by the [place]  FDA and has been authorized for detection and/or diagnosis of SARS-CoV-2 by FDA under an Emergency Use Authorization (EUA). This EUA will remain in effect (meaning this test can be used) for the duration of the COVID-19 declaration under Section 564(b)(1) of the Act, 21 U.S.C. section 360bbb-3(b)(1), unless the authorization is terminated or revoked.     Resp Syncytial Virus by PCR NEGATIVE NEGATIVE Final    Comment: (NOTE) Fact Sheet for Patients: BloggerCourse.com  Fact Sheet for Healthcare Providers: SeriousBroker.it  This test is not yet approved or cleared by the [place]  FDA and has been authorized for detection and/or diagnosis of SARS-CoV-2 by FDA under an Emergency Use Authorization (EUA). This EUA will remain in effect (meaning this test can be used) for the duration of the COVID-19 declaration under Section 564(b)(1) of the Act, 21 U.S.C. section 360bbb-3(b)(1), unless the authorization is terminated or revoked.  Performed at [facility], [address] [place], [place] [address]      Labs: BNP (last 3 results) No results for input(s): BNP in the last 8760 hours. Basic Metabolic Panel: Recent Labs  Lab 06/16/24 1104 06/16/24 1353 06/17/24 0500 06/18/24 0517  NA 136 137 145 139  K 4.3 4.0 4.9 4.4  CL 101 102 107 103  CO2 19* 20* 27 26  GLUCOSE 150* 45* 63* 164*  BUN 11 9 7* 12  CREATININE 0.83 0.77 0.84 0.80  CALCIUM  9.9 9.5 9.4 9.4  MG  --   --  1.6* 1.9   Liver Function  Tests: Recent Labs  Lab 06/16/24 1104  AST 24  ALT 10  ALKPHOS 88  BILITOT 0.3  PROT 6.9  ALBUMIN 4.1   Recent Labs  Lab 06/16/24 1104  LIPASE 12   No results for input(s): AMMONIA in the last 168 hours. CBC: Recent Labs  Lab 06/16/24 1104 06/17/24 0500 06/18/24 0517  WBC 7.3 6.9 6.9  NEUTROABS 5.0  --   --   HGB 11.5* 10.2* 9.7*  HCT 35.5* 32.8* 30.6*  MCV 93.7 96.2 95.9  PLT 275  262 235   Cardiac Enzymes: No results for input(s): CKTOTAL, CKMB, CKMBINDEX, TROPONINI in the last 168 hours. BNP: Invalid input(s): POCBNP CBG: Recent Labs  Lab 06/17/24 1539 06/17/24 2037 06/17/24 2323 06/18/24 0410 06/18/24 0728  GLUCAP 178* 206* 150* 172* 151*   D-Dimer Recent Labs    06/16/24 1452  DDIMER 0.79*   Hgb A1c Recent Labs    06/17/24 0500  HGBA1C 6.5*   Lipid Profile No results for input(s): CHOL, HDL, LDLCALC, TRIG, CHOLHDL, LDLDIRECT in the last 72 hours. Thyroid  function studies Recent Labs    06/17/24 0500  TSH 9.610*   Anemia work up Recent Labs    06/17/24 0500  VITAMINB12 562  FOLATE >20.0  FERRITIN 10*  TIBC 309  IRON 21*   Urinalysis    Component Value Date/Time   COLORURINE COLORLESS (A) 06/16/2024 1046   APPEARANCEUR CLEAR 06/16/2024 1046   LABSPEC <1.005 (L) 06/16/2024 1046   PHURINE 5.5 06/16/2024 1046   GLUCOSEU NEGATIVE 06/16/2024 1046   HGBUR NEGATIVE 06/16/2024 1046   BILIRUBINUR NEGATIVE 06/16/2024 1046   KETONESUR NEGATIVE 06/16/2024 1046   PROTEINUR NEGATIVE 06/16/2024 1046   UROBILINOGEN 0.2 10/20/2009 0813   NITRITE NEGATIVE 06/16/2024 1046   LEUKOCYTESUR NEGATIVE 06/16/2024 1046   Sepsis Labs Recent Labs  Lab 06/16/24 1104 06/17/24 0500 06/18/24 0517  WBC 7.3 6.9 6.9   Microbiology Recent Results (from the past 240 hours)  Resp panel by RT-PCR (RSV, Flu A&B, Covid) Anterior Nasal Swab     Status: None   Collection Time: 06/16/24 10:55 AM   Specimen: Anterior Nasal Swab   Result Value Ref Range Status   SARS Coronavirus 2 by RT PCR NEGATIVE NEGATIVE Final    Comment: (NOTE) SARS-CoV-2 target nucleic acids are NOT DETECTED.  The SARS-CoV-2 RNA is generally detectable in upper respiratory specimens during the acute phase of infection. The lowest concentration of SARS-CoV-2 viral copies this assay can detect is 138 copies/mL. A negative result does not preclude SARS-Cov-2 infection and should not be used as the sole basis for treatment or other patient management decisions. A negative result may occur with  improper specimen collection/handling, submission of specimen other than nasopharyngeal swab, presence of viral mutation(s) within the areas targeted by this assay, and inadequate number of viral copies(<138 copies/mL). A negative result must be combined with clinical observations, patient history, and epidemiological information. The expected result is Negative.  Fact Sheet for Patients:  BloggerCourse.com  Fact Sheet for Healthcare Providers:  SeriousBroker.it  This test is no t yet approved or cleared by the [place]  FDA and  has been authorized for detection and/or diagnosis of SARS-CoV-2 by FDA under an Emergency Use Authorization (EUA). This EUA will remain  in effect (meaning this test can be used) for the duration of the COVID-19 declaration under Section 564(b)(1) of the Act, 21 U.S.C.section 360bbb-3(b)(1), unless the authorization is terminated  or revoked sooner.       Influenza A by PCR NEGATIVE NEGATIVE Final   Influenza B by PCR NEGATIVE NEGATIVE Final    Comment: (NOTE) The Xpert Xpress SARS-CoV-2/FLU/RSV plus assay is intended as an aid in the diagnosis of influenza from Nasopharyngeal swab specimens and should not be used as a sole basis for treatment. Nasal washings and aspirates are unacceptable for Xpert Xpress SARS-CoV-2/FLU/RSV testing.  Fact Sheet for  Patients: BloggerCourse.com  Fact Sheet for Healthcare Providers: SeriousBroker.it  This test is not yet approved or cleared by the [place]  FDA and has been  authorized for detection and/or diagnosis of SARS-CoV-2 by FDA under an Emergency Use Authorization (EUA). This EUA will remain in effect (meaning this test can be used) for the duration of the COVID-19 declaration under Section 564(b)(1) of the Act, 21 U.S.C. section 360bbb-3(b)(1), unless the authorization is terminated or revoked.     Resp Syncytial Virus by PCR NEGATIVE NEGATIVE Final    Comment: (NOTE) Fact Sheet for Patients: BloggerCourse.com  Fact Sheet for Healthcare Providers: SeriousBroker.it  This test is not yet approved or cleared by the [place]  FDA and has been authorized for detection and/or diagnosis of SARS-CoV-2 by FDA under an Emergency Use Authorization (EUA). This EUA will remain in effect (meaning this test can be used) for the duration of the COVID-19 declaration under Section 564(b)(1) of the Act, 21 U.S.C. section 360bbb-3(b)(1), unless the authorization is terminated or revoked.  Performed at [facility], [address] [place], [place] [address]      Time coordinating discharge: Over 30 minutes  SIGNED:   Elsie JAYSON Montclair, DO [facility] 06/18/2024, 11:46 AM Pager   If 7PM-7AM, please contact night-coverage [email or web address]

## 2024-06-18 NOTE — Progress Notes (Signed)
 Physical Therapy Treatment Patient Details Name: [name] MRN: [id number] DOB: September 23, 1928 Today's Date: 06/18/2024   History of Present Illness 89 yo female admitted with hypoglycemia. Hx of spinal fusion, TKA, DM, melanoma, gout, OA, shingles, L foot 1st ray surery, DJD, depression    PT Comments  Pt agreeable to ambulate with therapy. Practiced using cane on today-pt continues to request cane-reports she did not receive a cane when it was recommended on last admission. She tolerated session well. She politely declines HHPT f/u despite PT recommendation. Will continue to follow pt during this hospital admission.     If plan is discharge home, recommend the following:     Can travel by private vehicle        Equipment Recommendations  [place] (pt is requesting-reports she never received cane when it was recommended last admission)    Recommendations for Other Services       Precautions / Restrictions Precautions Precautions: Fall Restrictions Weight Bearing Restrictions Per Provider Order: No     Mobility  Bed Mobility Overal bed mobility: Needs Assistance                  Transfers Overall transfer level: Needs assistance   Transfers: Sit to/from Stand Sit to Stand: Supervision           General transfer comment: Mildly unsteady with pt reaching out for objects to steady herself.    Ambulation/Gait Ambulation/Gait assistance: Contact guard assist Gait Distance (Feet): 135 Feet Assistive device: Straight cane Gait Pattern/deviations: Decreased stride length, Decreased step length - right, Decreased step length - left       General Gait Details: moves quickly with short step/stride lengths. tolerated distance well. no dizziness reported.   Stairs             Wheelchair Mobility     Tilt Bed    Modified Rankin (Stroke Patients Only)       Balance Overall balance assessment: Needs assistance         Standing balance support:  Bilateral upper extremity supported, During functional activity, No upper extremity supported Standing balance-Leahy Scale: Fair Standing balance comment: Had pt perform static standing tasks: withstanding perturbations, narrow BOS. Also has pt perform 360 degree turn and pick up object                            Communication Communication Communication: Impaired Factors Affecting Communication: Hearing impaired  Cognition Arousal: Alert Behavior During Therapy: WFL for tasks assessed/performed   PT - Cognitive impairments: No apparent impairments                         Following commands: Intact      Cueing    Exercises      General Comments        Pertinent Vitals/Pain Pain Assessment Pain Assessment: No/denies pain    Home Living                          Prior Function            PT Goals (current goals can now be found in the care plan section) Progress towards PT goals: Progressing toward goals    Frequency    Min 3X/week      PT Plan      Co-evaluation  AM-PAC PT 6 Clicks Mobility   Outcome Measure  Help needed turning from your back to your side while in a flat bed without using bedrails?: None Help needed moving from lying on your back to sitting on the side of a flat bed without using bedrails?: None Help needed moving to and from a bed to a chair (including a wheelchair)?: None Help needed standing up from a chair using your arms (e.g., wheelchair or bedside chair)?: None Help needed to walk in hospital room?: A Little Help needed climbing 3-5 steps with a railing? : A Little 6 Click Score: 22    End of Session Equipment Utilized During Treatment: Gait belt Activity Tolerance: Patient tolerated treatment well Patient left: in bed;with call bell/phone within reach;with bed alarm set   PT Visit Diagnosis: Unsteadiness on feet (R26.81)     Time: 9094-9071 PT Time Calculation (min)  (ACUTE ONLY): 23 min  Charges:    $Gait Training: 23-37 mins PT General Charges $$ ACUTE PT VISIT: 1 Visit                       Dannial SQUIBB, PT Acute Rehabilitation  Office: [phone]

## 2024-06-18 NOTE — TOC Initial Note (Signed)
 Transition of Care [facility]) - Initial/Assessment Note    Patient Details  Name: [name] MRN: [id number] Date of Birth: [birth year]  Transition of Care [facility]) CM/SW Contact:    Alfonse JONELLE Rex, RN Phone Number: 06/18/2024, 11:25 AM  Clinical Narrative:   Met with patient at bedside to introduce role of TOC/NCM and review for dc planning, pt reports she resides alone with support from her sister, no current home care services, patient reports she was to get a cane on her previous admission. NCM called to Jermaine w/[employer], confirmed he will have cane delivered to bedside today. PT eval completed. Recommendation for [facility] PT, pt declined. MOON reviewed with patient, patient declined to sign. MOON completed, copy provided to patient. TOC will continue to follow.                  Expected Discharge Plan: Home/Self Care Barriers to Discharge: Continued Medical Work up   Patient Goals and CMS Choice Patient states their goals for this hospitalization and ongoing recovery are:: return home          Expected Discharge Plan and Services       Living arrangements for the past 2 months: Single Family Home                                      Prior Living Arrangements/Services Living arrangements for the past 2 months: Single Family Home Lives with:: Self Patient language and need for interpreter reviewed:: Yes Do you feel safe going back to the place where you live?: Yes      Need for Family Participation in Patient Care: Yes (Comment) Care giver support system in place?: Yes (comment)   Criminal Activity/Legal Involvement Pertinent to Current Situation/Hospitalization: No - Comment as needed  Activities of Daily Living   ADL Screening (condition at time of admission) Independently performs ADLs?: Yes (appropriate for developmental age) Is the patient deaf or have difficulty hearing?: Yes Does the patient have difficulty seeing, even when wearing glasses/contacts?:  No Does the patient have difficulty concentrating, remembering, or making decisions?: No  Permission Sought/Granted                  Emotional Assessment Appearance:: Appears stated age Attitude/Demeanor/Rapport: Gracious Affect (typically observed): Accepting Orientation: : Oriented to Self, Oriented to Place, Oriented to  Time, Oriented to Situation Alcohol  / Substance Use: Not Applicable Psych Involvement: No (comment)  Admission diagnosis:  Nausea [R11.0] Hypoglycemia [E16.2] Acute respiratory failure with hypoxia (HCC) [J96.01] Postural dizziness with near syncope [R42, R55] Diarrhea, unspecified type [R19.7] Patient Active Problem List   Diagnosis Date Noted   Hypomagnesemia 06/17/2024   Anemia 06/17/2024   Hypoglycemia 06/16/2024   HTN (hypertension) 06/16/2024   Hypothyroidism 06/16/2024   Vitamin D  deficiency 06/16/2024   Disorder associated with type 2 diabetes mellitus (HCC) 06/16/2024   DM (diabetes mellitus) (HCC) 06/16/2024   Dehydration 06/16/2024   UTI (urinary tract infection) 05/01/2024   Dizziness 04/30/2024   History of total left knee replacement 12/16/2020   Unilateral primary osteoarthritis, right knee 07/02/2018   Diabetic foot infection (HCC) 04/02/2016   Cellulitis of left foot 04/02/2016   Hypokalemia 04/02/2016   Wound infection    Foot abscess, left 03/04/2016   Cellulitis of left lower extremity 03/01/2016   Cellulitis 03/01/2016   Altered mental status 08/20/2014   Slurred speech 08/20/2014   Pulmonary embolism (HCC)  08/25/2011   Spinal stenosis of lumbar region at multiple levels 08/23/2011   Diabetes mellitus (HCC) 08/23/2011   Shingles    GERD (gastroesophageal reflux disease) 07/25/2011   Constipation 07/25/2011   PCP:  Corlis Pagan, NP Pharmacy:   [facility] [phone] - [place], [place] - [address] [place] [address] Phone: [phone] Fax:  [phone]  [facility] [address] [place] [address] Phone: [phone] Fax: [phone]  [facility] - [place], [place] - [address] [facility] [address] [facility] AT [address] [facility] [place] [address] Phone: [phone] Fax: [phone]     Social Drivers of Health (SDOH) Social History: SDOH Screenings   Food Insecurity: No Food Insecurity (06/16/2024)  Housing: Low Risk  (06/16/2024)  Recent Concern: Housing - High Risk (04/30/2024)  Transportation Needs: No Transportation Needs (06/16/2024)  Utilities: Not At Risk (06/16/2024)  Social Connections: Moderately Integrated (06/16/2024)  Recent Concern: Social Connections - Socially Isolated (04/30/2024)  Tobacco Use: Low Risk  (04/20/2023)   SDOH Interventions:     Readmission Risk Interventions    05/02/2024   10:40 AM  Readmission Risk Prevention Plan  Post Dischage Appt Complete  Medication Screening Complete  Transportation Screening Complete

## 2024-06-18 NOTE — Plan of Care (Signed)

## 2024-06-18 NOTE — Care Management Obs Status (Signed)
 MEDICARE OBSERVATION STATUS NOTIFICATION   Patient Details  Name: [name] MRN: [id number] Date of Birth: Jan 24, 1928   Medicare Observation Status Notification Given:  Yes    Darlene JONELLE Rex, RN 06/18/2024, 11:00 AM

## 2024-06-18 NOTE — Progress Notes (Signed)
 Discharge instructions given questions asked and answered.

## 2024-08-01 ENCOUNTER — Other Ambulatory Visit: Payer: Self-pay

## 2024-08-01 ENCOUNTER — Emergency Department (HOSPITAL_BASED_OUTPATIENT_CLINIC_OR_DEPARTMENT_OTHER)
Admission: EM | Admit: 2024-08-01 | Discharge: 2024-08-02 | Disposition: A | Discharge status: Home / Self Care | Attending: Emergency Medicine | Admitting: Emergency Medicine

## 2024-08-01 ENCOUNTER — Emergency Department (HOSPITAL_BASED_OUTPATIENT_CLINIC_OR_DEPARTMENT_OTHER)

## 2024-08-01 ENCOUNTER — Encounter (HOSPITAL_BASED_OUTPATIENT_CLINIC_OR_DEPARTMENT_OTHER): Payer: Self-pay

## 2024-08-01 DIAGNOSIS — M25561 Pain in right knee: Secondary | ICD-10-CM | POA: Diagnosis present

## 2024-08-01 DIAGNOSIS — Z7982 Long term (current) use of aspirin: Secondary | ICD-10-CM | POA: Insufficient documentation

## 2024-08-01 DIAGNOSIS — R1011 Right upper quadrant pain: Secondary | ICD-10-CM | POA: Diagnosis not present

## 2024-08-01 DIAGNOSIS — R1031 Right lower quadrant pain: Secondary | ICD-10-CM | POA: Insufficient documentation

## 2024-08-01 LAB — CBC WITH DIFFERENTIAL/PLATELET
Abs Immature Granulocytes: 0.05 K/uL (ref 0.00–0.07)
Basophils Absolute: 0.1 K/uL (ref 0.0–0.1)
Basophils Relative: 1 %
Eosinophils Absolute: 0 K/uL (ref 0.0–0.5)
Eosinophils Relative: 0 %
HCT: 32.5 % — ABNORMAL LOW (ref 36.0–46.0)
Hemoglobin: 10.8 g/dL — ABNORMAL LOW (ref 12.0–15.0)
Immature Granulocytes: 1 %
Lymphocytes Relative: 23 %
Lymphs Abs: 2.1 K/uL (ref 0.7–4.0)
MCH: 30.3 pg (ref 26.0–34.0)
MCHC: 33.2 g/dL (ref 30.0–36.0)
MCV: 91.3 fL (ref 80.0–100.0)
Monocytes Absolute: 0.8 K/uL (ref 0.1–1.0)
Monocytes Relative: 8 %
Neutro Abs: 6.1 K/uL (ref 1.7–7.7)
Neutrophils Relative %: 67 %
Platelets: 283 K/uL (ref 150–400)
RBC: 3.56 MIL/uL — ABNORMAL LOW (ref 3.87–5.11)
RDW: 14.3 % (ref 11.5–15.5)
WBC: 9.1 K/uL (ref 4.0–10.5)
nRBC: 0 % (ref 0.0–0.2)

## 2024-08-01 LAB — COMPREHENSIVE METABOLIC PANEL WITH GFR
ALT: 49 U/L — ABNORMAL HIGH (ref 0–44)
AST: 57 U/L — ABNORMAL HIGH (ref 15–41)
Albumin: 4 g/dL (ref 3.5–5.0)
Alkaline Phosphatase: 121 U/L (ref 38–126)
Anion gap: 17 — ABNORMAL HIGH (ref 5–15)
BUN: 22 mg/dL (ref 8–23)
CO2: 20 mmol/L — ABNORMAL LOW (ref 22–32)
Calcium: 10.4 mg/dL — ABNORMAL HIGH (ref 8.9–10.3)
Chloride: 101 mmol/L (ref 98–111)
Creatinine, Ser: 1.25 mg/dL — ABNORMAL HIGH (ref 0.44–1.00)
GFR, Estimated: 40 mL/min — ABNORMAL LOW (ref >60)
Glucose, Bld: 237 mg/dL — ABNORMAL HIGH (ref 70–99)
Potassium: 4.6 mmol/L (ref 3.5–5.1)
Sodium: 138 mmol/L (ref 135–145)
Total Bilirubin: 0.4 mg/dL (ref 0.0–1.2)
Total Protein: 7 g/dL (ref 6.5–8.1)

## 2024-08-01 MED ORDER — IOHEXOL 300 MG/ML  SOLN
100.0000 mL | Freq: Once | INTRAMUSCULAR | Status: AC | PRN
  Administered 2024-08-01: 75 mL via INTRAVENOUS

## 2024-08-01 NOTE — Discharge Instructions (Signed)
 Right knee pain is secondary to bone-on-bone.  So that knee is worn out not sure if they would consider doing a right knee replacement.  Will need to follow-up with orthopedic doctor and your primary care doctor.  Rest of workup CT abdomen and pelvis without any acute findings.  And labs were normal.  Chest x-ray also negative.  Would recommend taking Exer strength Tylenol  2 tablets every 8 hours as needed for pain.
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
## 2024-08-01 NOTE — ED Notes (Signed)
 [name] [phone] Please call for updates.
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
## 2024-09-04 ENCOUNTER — Telehealth: Payer: Self-pay

## 2024-09-04 DIAGNOSIS — F0392 Unspecified dementia, unspecified severity, with psychotic disturbance: Secondary | ICD-10-CM

## 2024-09-09 ENCOUNTER — Telehealth: Payer: Self-pay | Admitting: *Deleted

## 2024-09-09 NOTE — Progress Notes (Signed)
 Complex Care Management Note Care Guide Note  09/09/2024 Name: [name] MRN: [id number] DOB: 12-03-1927   Complex Care Management Outreach Attempts: An unsuccessful telephone outreach was attempted today to offer the patient information about available complex care management services.  Follow Up Plan:  Additional outreach attempts will be made to offer the patient complex care management information and services.   Encounter Outcome:  No Answer  Thedford Franks, CMA [place]  [facility], [facility] Guide Direct Dial: [phone]  Fax: [phone] Website: [email or web address]

## 2024-09-10 NOTE — Progress Notes (Signed)
 Complex Care Management Note Care Guide Note  09/10/2024 Name: [name] MRN: [id number] DOB: [birth year]   Complex Care Management Outreach Attempts: A second unsuccessful outreach was attempted today to offer the patient with information about available complex care management services.  Follow Up Plan:  Additional outreach attempts will be made to offer the patient complex care management information and services.   Encounter Outcome:  No Answer  Thedford Franks, CMA [place]  [facility], [facility] Guide Direct Dial: [phone]  Fax: [phone] Website: [email or web address]

## 2024-09-15 NOTE — Progress Notes (Signed)
 Complex Care Management Note Care Guide Note  09/15/2024 Name: GWENDLYON ZUMBRO MRN: [id number] DOB: [birth year]   Complex Care Management Outreach Attempts: A third unsuccessful outreach was attempted today to offer the patient with information about available complex care management services.  Follow Up Plan:  No further outreach attempts will be made at this time. We have been unable to contact the patient to offer or enroll patient in complex care management services.  Encounter Outcome:  No Answer  Thedford Franks, CMA [place]  [facility], [facility] Guide Direct Dial: [phone]  Fax: [phone] Website: [email or web address]

## 2024-09-18 ENCOUNTER — Inpatient Hospital Stay (HOSPITAL_COMMUNITY)

## 2024-09-18 ENCOUNTER — Emergency Department (HOSPITAL_COMMUNITY)

## 2024-09-18 ENCOUNTER — Other Ambulatory Visit: Payer: Self-pay

## 2024-09-18 ENCOUNTER — Encounter (HOSPITAL_COMMUNITY): Payer: Self-pay

## 2024-09-18 ENCOUNTER — Inpatient Hospital Stay (HOSPITAL_COMMUNITY)
Admission: EM | Admit: 2024-09-18 | Discharge: 2024-10-16 | DRG: 308 | Disposition: E | Attending: Internal Medicine | Admitting: Internal Medicine

## 2024-09-18 DIAGNOSIS — K219 Gastro-esophageal reflux disease without esophagitis: Secondary | ICD-10-CM | POA: Diagnosis present

## 2024-09-18 DIAGNOSIS — Z7189 Other specified counseling: Secondary | ICD-10-CM

## 2024-09-18 DIAGNOSIS — Z89432 Acquired absence of left foot: Secondary | ICD-10-CM | POA: Diagnosis not present

## 2024-09-18 DIAGNOSIS — R001 Bradycardia, unspecified: Secondary | ICD-10-CM | POA: Diagnosis not present

## 2024-09-18 DIAGNOSIS — F32A Depression, unspecified: Secondary | ICD-10-CM | POA: Diagnosis present

## 2024-09-18 DIAGNOSIS — D638 Anemia in other chronic diseases classified elsewhere: Secondary | ICD-10-CM | POA: Diagnosis present

## 2024-09-18 DIAGNOSIS — I2489 Other forms of acute ischemic heart disease: Secondary | ICD-10-CM | POA: Diagnosis present

## 2024-09-18 DIAGNOSIS — I498 Other specified cardiac arrhythmias: Secondary | ICD-10-CM | POA: Diagnosis present

## 2024-09-18 DIAGNOSIS — R627 Adult failure to thrive: Secondary | ICD-10-CM

## 2024-09-18 DIAGNOSIS — E869 Volume depletion, unspecified: Secondary | ICD-10-CM | POA: Diagnosis present

## 2024-09-18 DIAGNOSIS — F0393 Unspecified dementia, unspecified severity, with mood disturbance: Secondary | ICD-10-CM | POA: Diagnosis present

## 2024-09-18 DIAGNOSIS — E878 Other disorders of electrolyte and fluid balance, not elsewhere classified: Secondary | ICD-10-CM | POA: Diagnosis not present

## 2024-09-18 DIAGNOSIS — E119 Type 2 diabetes mellitus without complications: Secondary | ICD-10-CM

## 2024-09-18 DIAGNOSIS — I10 Essential (primary) hypertension: Secondary | ICD-10-CM | POA: Diagnosis present

## 2024-09-18 DIAGNOSIS — R64 Cachexia: Secondary | ICD-10-CM | POA: Diagnosis present

## 2024-09-18 DIAGNOSIS — N179 Acute kidney failure, unspecified: Secondary | ICD-10-CM | POA: Diagnosis not present

## 2024-09-18 DIAGNOSIS — F0390 Unspecified dementia without behavioral disturbance: Secondary | ICD-10-CM | POA: Diagnosis not present

## 2024-09-18 DIAGNOSIS — D631 Anemia in chronic kidney disease: Secondary | ICD-10-CM | POA: Diagnosis not present

## 2024-09-18 DIAGNOSIS — E876 Hypokalemia: Secondary | ICD-10-CM | POA: Diagnosis present

## 2024-09-18 DIAGNOSIS — I442 Atrioventricular block, complete: Secondary | ICD-10-CM | POA: Diagnosis present

## 2024-09-18 DIAGNOSIS — E872 Acidosis, unspecified: Secondary | ICD-10-CM | POA: Diagnosis present

## 2024-09-18 DIAGNOSIS — M109 Gout, unspecified: Secondary | ICD-10-CM | POA: Diagnosis present

## 2024-09-18 DIAGNOSIS — I2510 Atherosclerotic heart disease of native coronary artery without angina pectoris: Secondary | ICD-10-CM | POA: Diagnosis present

## 2024-09-18 DIAGNOSIS — I459 Conduction disorder, unspecified: Secondary | ICD-10-CM | POA: Diagnosis present

## 2024-09-18 DIAGNOSIS — M1990 Unspecified osteoarthritis, unspecified site: Secondary | ICD-10-CM | POA: Diagnosis present

## 2024-09-18 DIAGNOSIS — Z66 Do not resuscitate: Secondary | ICD-10-CM | POA: Diagnosis present

## 2024-09-18 DIAGNOSIS — I472 Ventricular tachycardia, unspecified: Secondary | ICD-10-CM | POA: Diagnosis present

## 2024-09-18 DIAGNOSIS — R7989 Other specified abnormal findings of blood chemistry: Secondary | ICD-10-CM | POA: Diagnosis not present

## 2024-09-18 DIAGNOSIS — N170 Acute kidney failure with tubular necrosis: Secondary | ICD-10-CM | POA: Diagnosis present

## 2024-09-18 DIAGNOSIS — Z515 Encounter for palliative care: Secondary | ICD-10-CM | POA: Diagnosis not present

## 2024-09-18 LAB — TROPONIN I (HIGH SENSITIVITY): Troponin I (High Sensitivity): 110 ng/L (ref <18)

## 2024-09-18 LAB — I-STAT CHEM 8, ED
BUN: 47 mg/dL — ABNORMAL HIGH (ref 8–23)
Calcium, Ion: 0.78 mmol/L — CL (ref 1.15–1.40)
Chloride: 116 mmol/L — ABNORMAL HIGH (ref 98–111)
Creatinine, Ser: 1.2 mg/dL — ABNORMAL HIGH (ref 0.44–1.00)
Glucose, Bld: 161 mg/dL — ABNORMAL HIGH (ref 70–99)
HCT: 23 % — ABNORMAL LOW (ref 36.0–46.0)
Hemoglobin: 7.8 g/dL — ABNORMAL LOW (ref 12.0–15.0)
Potassium: 3.1 mmol/L — ABNORMAL LOW (ref 3.5–5.1)
Sodium: 144 mmol/L (ref 135–145)
TCO2: 12 mmol/L — ABNORMAL LOW (ref 22–32)

## 2024-09-18 LAB — LACTIC ACID, PLASMA: Lactic Acid, Venous: 2.5 mmol/L (ref 0.5–1.9)

## 2024-09-18 LAB — CBC WITH DIFFERENTIAL/PLATELET
Abs Immature Granulocytes: 0.06 K/uL (ref 0.00–0.07)
Basophils Absolute: 0.1 K/uL (ref 0.0–0.1)
Basophils Relative: 1 %
Eosinophils Absolute: 0 K/uL (ref 0.0–0.5)
Eosinophils Relative: 0 %
HCT: 31.8 % — ABNORMAL LOW (ref 36.0–46.0)
Hemoglobin: 10 g/dL — ABNORMAL LOW (ref 12.0–15.0)
Immature Granulocytes: 1 %
Lymphocytes Relative: 19 %
Lymphs Abs: 2.4 K/uL (ref 0.7–4.0)
MCH: 29.4 pg (ref 26.0–34.0)
MCHC: 31.4 g/dL (ref 30.0–36.0)
MCV: 93.5 fL (ref 80.0–100.0)
Monocytes Absolute: 0.9 K/uL (ref 0.1–1.0)
Monocytes Relative: 7 %
Neutro Abs: 9.1 K/uL — ABNORMAL HIGH (ref 1.7–7.7)
Neutrophils Relative %: 72 %
Platelets: 181 K/uL (ref 150–400)
RBC: 3.4 MIL/uL — ABNORMAL LOW (ref 3.87–5.11)
RDW: 14.2 % (ref 11.5–15.5)
WBC: 12.5 K/uL — ABNORMAL HIGH (ref 4.0–10.5)
nRBC: 0.2 % (ref 0.0–0.2)

## 2024-09-18 LAB — BASIC METABOLIC PANEL WITH GFR
Anion gap: 15 (ref 5–15)
BUN: 54 mg/dL — ABNORMAL HIGH (ref 8–23)
CO2: 15 mmol/L — ABNORMAL LOW (ref 22–32)
Calcium: 8.8 mg/dL — ABNORMAL LOW (ref 8.9–10.3)
Chloride: 107 mmol/L (ref 98–111)
Creatinine, Ser: 1.62 mg/dL — ABNORMAL HIGH (ref 0.44–1.00)
GFR, Estimated: 29 mL/min — ABNORMAL LOW (ref >60)
Glucose, Bld: 269 mg/dL — ABNORMAL HIGH (ref 70–99)
Potassium: 4.2 mmol/L (ref 3.5–5.1)
Sodium: 137 mmol/L (ref 135–145)

## 2024-09-18 LAB — MAGNESIUM
Magnesium: 1 mg/dL — ABNORMAL LOW (ref 1.7–2.4)
Magnesium: 2 mg/dL (ref 1.7–2.4)

## 2024-09-18 LAB — COMPREHENSIVE METABOLIC PANEL WITH GFR
ALT: 19 U/L (ref 0–44)
AST: 26 U/L (ref 15–41)
Albumin: 2.1 g/dL — ABNORMAL LOW (ref 3.5–5.0)
Alkaline Phosphatase: 57 U/L (ref 38–126)
Anion gap: 9 (ref 5–15)
BUN: 42 mg/dL — ABNORMAL HIGH (ref 8–23)
CO2: 13 mmol/L — ABNORMAL LOW (ref 22–32)
Calcium: 6.1 mg/dL — CL (ref 8.9–10.3)
Chloride: 119 mmol/L — ABNORMAL HIGH (ref 98–111)
Creatinine, Ser: 1.29 mg/dL — ABNORMAL HIGH (ref 0.44–1.00)
GFR, Estimated: 38 mL/min — ABNORMAL LOW (ref >60)
Glucose, Bld: 178 mg/dL — ABNORMAL HIGH (ref 70–99)
Potassium: 3.2 mmol/L — ABNORMAL LOW (ref 3.5–5.1)
Sodium: 141 mmol/L (ref 135–145)
Total Bilirubin: 0.9 mg/dL (ref 0.0–1.2)
Total Protein: 3.9 g/dL — ABNORMAL LOW (ref 6.5–8.1)

## 2024-09-18 LAB — PHOSPHORUS: Phosphorus: 3.9 mg/dL (ref 2.5–4.6)

## 2024-09-18 LAB — GLUCOSE, CAPILLARY: Glucose-Capillary: 235 mg/dL — ABNORMAL HIGH (ref 70–99)

## 2024-09-18 LAB — TSH: TSH: 5.923 u[IU]/mL — ABNORMAL HIGH (ref 0.350–4.500)

## 2024-09-18 LAB — MRSA NEXT GEN BY PCR, NASAL: MRSA by PCR Next Gen: NOT DETECTED

## 2024-09-18 LAB — LIPASE, BLOOD: Lipase: 18 U/L (ref 11–51)

## 2024-09-18 LAB — BRAIN NATRIURETIC PEPTIDE: B Natriuretic Peptide: 3023.8 pg/mL — ABNORMAL HIGH (ref 0.0–100.0)

## 2024-09-18 MED ORDER — LACTATED RINGERS IV BOLUS
1000.0000 mL | Freq: Once | INTRAVENOUS | Status: AC
  Administered 2024-09-18: 1000 mL via INTRAVENOUS

## 2024-09-18 MED ORDER — DOCUSATE SODIUM 100 MG PO CAPS: 100.0000 mg | ORAL_CAPSULE | Freq: Two times a day (BID) | ORAL | Status: DC | PRN

## 2024-09-18 MED ORDER — SODIUM CHLORIDE 0.9 % IV BOLUS
1000.0000 mL | Freq: Once | INTRAVENOUS | Status: AC
  Administered 2024-09-18: 1000 mL via INTRAVENOUS

## 2024-09-18 MED ORDER — CALCIUM GLUCONATE-NACL 2-0.675 GM/100ML-% IV SOLN
2.0000 g | Freq: Once | INTRAVENOUS | Status: AC
  Administered 2024-09-18: 2000 mg via INTRAVENOUS
  Filled 2024-09-18 (×2): qty 100

## 2024-09-18 MED ORDER — CALCIUM GLUCONATE-NACL 1-0.675 GM/50ML-% IV SOLN
1.0000 g | Freq: Once | INTRAVENOUS | Status: AC
  Administered 2024-09-18: 1000 mg via INTRAVENOUS
  Filled 2024-09-18: qty 50

## 2024-09-18 MED ORDER — INSULIN ASPART 100 UNIT/ML IJ SOLN: 0.0000 [IU] | Freq: Three times a day (TID) | INTRAMUSCULAR | Status: DC

## 2024-09-18 MED ORDER — POTASSIUM CHLORIDE 10 MEQ/100ML IV SOLN
10.0000 meq | INTRAVENOUS | Status: DC
  Administered 2024-09-18: 10 meq via INTRAVENOUS
  Filled 2024-09-18 (×2): qty 100

## 2024-09-18 MED ORDER — HYDRALAZINE HCL 20 MG/ML IJ SOLN
10.0000 mg | Freq: Once | INTRAMUSCULAR | Status: AC
  Administered 2024-09-18: 10 mg via INTRAVENOUS
  Filled 2024-09-18: qty 1

## 2024-09-18 MED ORDER — MAGNESIUM SULFATE 2 GM/50ML IV SOLN
2.0000 g | Freq: Once | INTRAVENOUS | Status: AC
  Administered 2024-09-18: 2 g via INTRAVENOUS
  Filled 2024-09-18: qty 50

## 2024-09-18 MED ORDER — POLYETHYLENE GLYCOL 3350 17 G PO PACK: 17.0000 g | PACK | Freq: Every day | ORAL | Status: DC | PRN

## 2024-09-18 MED ORDER — MAGNESIUM SULFATE 2 GM/50ML IV SOLN
2.0000 g | Freq: Once | INTRAVENOUS | Status: DC
  Filled 2024-09-18: qty 50

## 2024-09-18 MED ORDER — ONDANSETRON HCL 4 MG/2ML IJ SOLN
4.0000 mg | Freq: Once | INTRAMUSCULAR | Status: AC
  Administered 2024-09-18: 4 mg via INTRAVENOUS
  Filled 2024-09-18: qty 2

## 2024-09-18 MED ORDER — CHLORHEXIDINE GLUCONATE CLOTH 2 % EX PADS
6.0000 | MEDICATED_PAD | Freq: Every day | CUTANEOUS | Status: DC
  Administered 2024-09-18: 6 via TOPICAL

## 2024-09-18 NOTE — Progress Notes (Signed)
 eLink Physician-Brief Progress Note Patient Name: [name] DOB: [birth year] MRN: [id number]   Date of Service  09/18/2024  HPI/Events of Note  Nausea/vomiting  eICU Interventions  Zofran  x1   0004 -blood pressure suddenly went from 170s-200s now down to 60s over 30s on arterial line.  Arterial line repositioned and triaged without any changes.  Mentating the same, moving upper and lower extremities on command without difficulty.  Unclear whether this reading is real, will attempt manual blood pressure cuff, if confirmed, initiate low-dose epinephrine   0009 -retested by power flushing the line and performing a manual blood pressure.  Seems like the blood pressures have not changed, likely artifact  Intervention Category Minor Interventions: Routine modifications to care plan (e.g. PRN medications for pain, fever)  [name] 09/18/2024, 10:46 PM

## 2024-09-18 NOTE — Procedures (Signed)
 Arterial Catheter Insertion Procedure Note  LESLEYANNE POLITTE  [id number]  Oct 14, 1928  Date:09/18/24  Time:3:27 PM    Provider Performing: Warren DEL Nzinga Ferran    Procedure: Insertion of Arterial Line (63379) with US  guidance (23062)   Indication(s) Blood pressure monitoring and/or need for frequent ABGs  Consent Risks of the procedure as well as the alternatives and risks of each were explained to the patient and/or caregiver.  Consent for the procedure was obtained and is signed in the bedside chart  Anesthesia Lidocaine  5ml   Time Out Verified patient identification, verified procedure, site/side was marked, verified correct patient position, special equipment/implants available, medications/allergies/relevant history reviewed, required imaging and test results available.   Sterile Technique Maximal sterile technique including full sterile barrier drape, hand hygiene, sterile gown, sterile gloves, mask, hair covering, sterile ultrasound probe cover (if used).   Procedure Description Area of catheter insertion was cleaned with chlorhexidine  and draped in sterile fashion. With real-time ultrasound guidance an arterial catheter was placed into the left radial artery.  Appropriate arterial tracings confirmed on monitor.     Complications/Tolerance None; patient tolerated the procedure well.   EBL Minimal   Specimen(s) None  Warren Shade, DNP, AGACNP-BC [facility]  Please see [email or web address] for pager details.  From 7A-7P if no response, please call [phone]. After hours, please call ELink [phone].

## 2024-09-18 NOTE — ED Triage Notes (Signed)
 Pt BIB [facility] from home for 2nd degree HB, diarrhea x2-3 days, and lack of appetite. Per EMS pt HR 20s-30s in 2nd degree HB, concerning for 3rd degree. Per EMS pt family reports not eating and drinking as normal. BP 140/70, all other VSS per EMS. 1mg  atropine and 1L NS given by EMS. DNR paperwork at [facility].

## 2024-09-18 NOTE — Consult Note (Signed)
 Advanced Heart Failure Team Consult Note  Primary Physician: Gerome Brunet, DO Cardiologist:  None Reason for Consultation: Bradycardia, heart block HPI:    [name] is seen today for evaluation of bradycardia, heart block at the request of Dr. Gatha.   [name] is a 89 y.o. with history of T2DM, HTN, PE and early dementia.  She arrived to [facility] from home by EMS. Initially called for concerns of dehydration by family for nausea and lack of appetite for 2-3 days. Family was concerned about failure to thrive. She was found to be in second degree heart block by EMS and given 1 mg atropine and 1L NS.   In ED, ECG showed 3rd degree heart block at 28 bpm with hypertension 200-/50-60s. She was admitted to ICU and started on IVF/electrolyte replacement.  Labs in ED: K 3.2, CO2 13, 42/1.29, Ca 6.1, ical 0.78, hs-trop 110. CXR with low lung volumes. CT chest A/P with CAD and aortic arthrosclerosis.    She remains with HR in 20s with hypertension 230s/30s. Complains of thirst. Discussed TVP, however on further discussion with family, this would not be the patient's wishes.   Home Medications Prior to Admission medications   Medication Sig Start Date End Date Taking? Authorizing Provider  acetaminophen  (8 HR ARTHRITIS PAIN RELIEF) 650 MG CR tablet Take 650 mg by mouth as needed for pain.    [provider]  allopurinol  (ZYLOPRIM ) 300 MG tablet Take 300 mg by mouth daily.    [provider]  amLODipine  (NORVASC ) 5 MG tablet Take 5 mg by mouth daily.      [provider]  aspirin  EC 81 MG tablet Take 81 mg by mouth daily.    [provider]  B Complex Vitamins (VITAMIN-B COMPLEX PO) Take 1 tablet by mouth daily in the afternoon.    [provider]  Calcium  Carb-Cholecalciferol (CALCIUM  PLUS VITAMIN D3) 600-20 MG-MCG TABS Take 1 tablet by mouth daily in the afternoon.    [provider]  diclofenac  sodium (VOLTAREN ) 1 % GEL Apply  2 g topically 4 (four) times daily. Patient taking differently: Apply 2 g topically as needed. 07/02/18   Vernetta Lonni GRADE, MD  escitalopram  (LEXAPRO ) 5 MG tablet Take 5 mg by mouth daily. 08/31/22   [provider]  hyoscyamine  (LEVSIN  SL) 0.125 MG SL tablet Place 1 tablet (0.125 mg total) under the tongue every 6 (six) hours as needed. 04/20/23   Beather Delon Gibson, PA  LORazepam  (ATIVAN ) 0.5 MG tablet Take 1 tablet (0.5 mg total) by mouth daily as needed for anxiety. Wean off this medication over the next few weeks (Discuss with PCP) 06/18/24   Lue Elsie BROCKS, MD  methocarbamol  (ROBAXIN ) 500 MG tablet Take 1 tablet (500 mg total) by mouth 3 (three) times daily as needed (muscle spasm/pain). 03/07/22   Bernard Drivers, MD  omeprazole  (PRILOSEC) 20 MG capsule Take 20 mg by mouth daily.    [provider]  ramipril  (ALTACE ) 10 MG capsule Take 10 mg by mouth daily.      [provider]   Past Medical History: Past Medical History:  Diagnosis Date   Cancer (HCC)    melanoma- R arm , area excised    Degenerative joint disease    Depression    Diabetes mellitus, type 2 (HCC)    Fundic gland polyps of stomach, benign    GERD (gastroesophageal reflux disease)    Gout    Helicobacter pylori gastritis  Hiatal hernia    Hypertension    Lumbar spinal stenosis    Osteoarthritis    Shingles    current shingles- x4 yrs., on L side of face      Tubular adenoma of colon 07/17/2011   Past Surgical History: Past Surgical History:  Procedure Laterality Date   AMPUTATION Left 03/04/2016   Procedure: FOOT FIRST RAY AMPUTATION;  Surgeon: Jerona Harden GAILS, MD;  Location: [facility] OR;  Service: Orthopedics;  Laterality: Left;   benign br. biopsy- many yrs. ago     BREAST EXCISIONAL BIOPSY Right    CARPAL TUNNEL RELEASE     bilateral   CATARACT EXTRACTION     bilateral, /w IOL   CHOLECYSTECTOMY  1998   HEMORRHOID SURGERY     x 2   LUMBAR LAMINECTOMY     6 prior back  surgery    RETINAL LASER PROCEDURE     bilateral   ROTATOR CUFF REPAIR     bilateral   SPINAL FUSION  08/22/2011   Procedure: FUSION POSTERIOR SPINAL MULTILEVEL/SCOLIOSIS;  Surgeon: GORMAN Ozell Mans;  Location: [facility] OR;  Service: Orthopedics;  Laterality: N/A;  LUMBAR LAMINECTOMY, EXTENSION OF POSTERIOR LUMBAR FUSION TO T10 WITH K2M RODS, SCREWS, CONNECTORS, HOOKS, ILIAC CREST BONE GRAFT   TONSILLECTOMY     TOTAL KNEE ARTHROPLASTY     left   VAGINAL HYSTERECTOMY  1990's   Family History: Family History  Problem Relation Age of Onset   Diabetes Mother    Cancer Mother        mother   Diabetes Paternal Aunt        x 1   Diabetes Paternal Uncle        x 3   Diabetes Brother    Diabetes Other        neice   Heart disease Father    Cancer Father        gallbladder   Liver cancer Father    Heart disease Brother    Colon polyps Sister    Colon cancer Neg Hx    Anesthesia problems Neg Hx    Hypotension Neg Hx    Malignant hyperthermia Neg Hx    Pseudochol deficiency Neg Hx     Social History: Social History   Socioeconomic History   Marital status: Divorced    Spouse name: Not on file   Number of children: Not on file   Years of education: Not on file   Highest education level: Not on file  Occupational History   Not on file  Tobacco Use   Smoking status: Never   Smokeless tobacco: Never  Vaping Use   Vaping status: Never Used  Substance and Sexual Activity   Alcohol  use: No   Drug use: No   Sexual activity: Not on file  Other Topics Concern   Not on file  Social History Narrative   Not on file   Social Drivers of Health   Financial Resource Strain: Not on file  Food Insecurity: No Food Insecurity (06/16/2024)   Hunger Vital Sign    Worried About Running Out of Food in the Last Year: Never true    Ran Out of Food in the Last Year: Never true  Transportation Needs: No Transportation Needs (06/16/2024)   PRAPARE - Administrator, Civil Service  (Medical): No    Lack of Transportation (Non-Medical): No  Physical Activity: Not on file  Stress: Not on file  Social Connections: Moderately Integrated (06/16/2024)  Social Connection and Isolation Panel    Frequency of Communication with Friends and Family: More than three times a week    Frequency of Social Gatherings with Friends and Family: Once a week    Attends Religious Services: 1 to 4 times per year    Active Member of [employer] or Organizations: Yes    Attends Banker Meetings: 1 to 4 times per year    Marital Status: Divorced  Recent Concern: Social Connections - Socially Isolated (04/30/2024)   Social Connection and Isolation Panel    Frequency of Communication with Friends and Family: Never    Frequency of Social Gatherings with Friends and Family: Once a week    Attends Religious Services: Never    Database Administrator or Organizations: No    Attends Banker Meetings: Never    Marital Status: Divorced    Allergies:  Allergies  Allergen Reactions   Celebrex [Celecoxib] Swelling   Acarbose Other (See Comments)    Gas    Amoxicillin  Hives   Amoxicillin -Pot Clavulanate Diarrhea   Bisoprolol     Other Reaction(s): fatigue   Diltiazem Other (See Comments)    Headaches   Duloxetine Nausea And Vomiting   Fluoxetine Other (See Comments)    Gi upset    Gabapentin Other (See Comments)    Blurred vision    Lisinopril Cough   Meloxicam Other (See Comments)    Fatigue    Metoprolol Dermatitis   Pregabalin  Other (See Comments)    Fatigue    Sitagliptin Other (See Comments)    Crazy thoughts    Warfarin Diarrhea   Morphine Sulfate Nausea Only   Percocet [Oxycodone-Acetaminophen ] Itching   Robinul  [Glycopyrrolate ] Rash    Objective:    Vital Signs:   Temp:  [96.7 F (35.9 C)] 96.7 F (35.9 C) (12/04 1157) Pulse Rate:  [28-29] 29 (12/04 1215) Resp:  [13-17] 13 (12/04 1215) BP: (150)/(60) 150/60 (12/04 1157) SpO2:  [96 %-100 %] 96 %  (12/04 1215) Weight:  [55.8 kg] 55.8 kg (12/04 1156)   Weight change: Filed Weights   09/18/24 1156  Weight: 55.8 kg   Intake/Output:  No intake or output data in the 24 hours ending 09/18/24 1551   Physical Exam    General: Frail appearing. No distress  Cardiac: Bradycardic Extremities: Warm and dry. No edema.  Neuro: A&O x3. Affect pleasant.   Telemetry   3rd deg AVB 20s (personally reviewed)  Labs   Basic Metabolic Panel: Recent Labs  Lab 09/18/24 1206 09/18/24 1214  NA 141 144  K 3.2* 3.1*  CL 119* 116*  CO2 13*  --   GLUCOSE 178* 161*  BUN 42* 47*  CREATININE 1.29* 1.20*  CALCIUM  6.1*  --   MG 1.0*  --     Liver Function Tests: Recent Labs  Lab 09/18/24 1206  AST 26  ALT 19  ALKPHOS 57  BILITOT 0.9  PROT 3.9*  ALBUMIN 2.1*   Recent Labs  Lab 09/18/24 1206  LIPASE 18   No results for input(s): AMMONIA in the last 168 hours.  CBC: Recent Labs  Lab 09/18/24 1206 09/18/24 1214  WBC 12.5*  --   NEUTROABS 9.1*  --   HGB 10.0* 7.8*  HCT 31.8* 23.0*  MCV 93.5  --   PLT 181  --    Medications:    Current Medications:  Chlorhexidine Gluconate Cloth  6 each Topical Daily    Infusions:  calcium  gluconate  magnesium  sulfate bolus IVPB     potassium chloride      Assessment/Plan   Bradycardia, Third degree Heart Block - rate in the 20s - resolve electrolyte derangements - TVP and pacemaker discussed with family, the patient has clearly stated her wishes to them and she would not want either of those options. - focus on comfort measures. No pressors or inotropes  Hypertension - BP likely compensatory for bradycardia - would not attempt to lower   Failure to Thrive - she has not been eating or drinking for multiple days - unable to safety care for herself at home, refuses care facility  Elevated Troponin - hs-trop 110 - demand ischemia in the setting of heart block - no chest pain  Hypocalcemia, Hypokalemia,  Hypomagnesemia - ical 0.78, K 3.2, Mg 1 - supp for goals ical >1.14, K >4, Mg >2 - repeat BMET   Length of Stay: 0  Lindsea Olivar, NP  09/18/2024, 3:51 PM  Advanced Heart Failure Team Pager [phone] (M-F; 7a - 5p)  Please contact [facility] for night-coverage after hours (4p -7a ) and weekends on [email or web address]

## 2024-09-18 NOTE — H&P (Signed)
 NAME:  [name], MRN:  [id number], DOB:  August 23, 1928, LOS: 0 ADMISSION DATE:  09/18/2024, CONSULTATION DATE:  09/18/24 REFERRING MD:  EDP, CHIEF COMPLAINT:  bradycardia   History of Present Illness:  [name] is a 89 y.o. F with PMH significant for recently diagnosed dementia, HTN, Type 2 DM, osteoarthritis and remote colon adenoma who presented to the ED after three days of diarrhea and poor po intake and was found to have 3rd degree HB with rate 20-30 without hypotension.  She lives alone with a sister who checks in with her, but family believes that she has had very little po intake for likely longer than this.  Family denies any hematochezia or melena, pt has had some shortness of breath but no chest pain, no cardiac hx.   She has complained of significant upper and lower abdominal pain.   Work-up revealed significant hypomagnesemia, hypocalcemia and hypokalemia with AKI.   CXR clear.  Mag 1.0, ionized Ca 0.78, creatinine 1.2, K 3.1, WBC 12.5, bili and LFT's WNL.   Cardiology was consulted and PCCM asked to admit in this setting   Family states that patient has expressed that she is ready to die and has been refusing to eat and drink because of this.  They do not think she would want any major interventions including a temporary pacemaker or pressors    Pertinent  Medical History   has a past medical history of Cancer (HCC), Degenerative joint disease, Depression, Diabetes mellitus, type 2 (HCC), Fundic gland polyps of stomach, benign, GERD (gastroesophageal reflux disease), Gout, Helicobacter pylori gastritis, Hiatal hernia, Hypertension, Lumbar spinal stenosis, Osteoarthritis, Shingles, and Tubular adenoma of colon (07/17/2011).   Significant Hospital Events: Including procedures, antibiotic start and stop dates in addition to other pertinent events   12/4 admit to ICU with bradycardia   Interim History / Subjective:  Hypertensive and bradycardic on arrival to the  ICU  Objective    Blood pressure (!) 150/60, pulse (!) 29, temperature (!) 96.7 F (35.9 C), temperature source Oral, resp. rate 13, height 5' 1 (1.549 m), weight 55.8 kg, SpO2 96%.       No intake or output data in the 24 hours ending 09/18/24 1445 Filed Weights   09/18/24 1156  Weight: 55.8 kg   General:  thin, elderly F resting in bed awake and in NAD  HEENT: MM pink/dry, sclera anicteric  Neuro: oriented to person only, following commands, increasingly anxious  CV: s1s2 bradycardic, no m/r/g PULM:  clear bilaterally on RA  GI: soft, non-distended, diffusely TTP Extremities: warm/dry, no edema  Skin: no rashes or lesions   Resolved problem list   Assessment and Plan   New onset bradycardia in the setting of diarrhea and volume depletion with significant electrolyte derangement, hypomagnesemic, hypocalcemic and hypokalemic  Hypertension  AKI Failure to thrive  -resuscitate with IVF, replete Mag, K and Ca, given Mag 4g, Ca Gluc 3g and K 40meq -receiving 2nd L IVF -check lactic acid, BNP, Trop, repeat BMP and TSH -UA -cardiology consulting, family clear do not want interventions -CT abd/pelvis without acute findings  -if patient worsens plan to pursue comfort care    Dementia  -family states that patient has been adamant about living at home alone, she has a sister who checks in on her frequently, but they feel she should not return home -SW consult   Type 2 DM -SSI   Labs   CBC: Recent Labs  Lab 09/18/24 1206 09/18/24 1214  WBC 12.5*  --   NEUTROABS 9.1*  --   HGB 10.0* 7.8*  HCT 31.8* 23.0*  MCV 93.5  --   PLT 181  --     Basic Metabolic Panel: Recent Labs  Lab 09/18/24 1206 09/18/24 1214  NA 141 144  K 3.2* 3.1*  CL 119* 116*  CO2 13*  --   GLUCOSE 178* 161*  BUN 42* 47*  CREATININE 1.29* 1.20*  CALCIUM  6.1*  --   MG 1.0*  --    GFR: Estimated Creatinine Clearance: 21.6 mL/min (A) (by C-G formula based on SCr of 1.2 mg/dL (H)). Recent  Labs  Lab 09/18/24 1206  WBC 12.5*    Liver Function Tests: Recent Labs  Lab 09/18/24 1206  AST 26  ALT 19  ALKPHOS 57  BILITOT 0.9  PROT 3.9*  ALBUMIN 2.1*   No results for input(s): LIPASE, AMYLASE in the last 168 hours. No results for input(s): AMMONIA in the last 168 hours.  ABG    Component Value Date/Time   TCO2 12 (L) 09/18/2024 1214     Coagulation Profile: No results for input(s): INR, PROTIME in the last 168 hours.  Cardiac Enzymes: No results for input(s): CKTOTAL, CKMB, CKMBINDEX, TROPONINI in the last 168 hours.  HbA1C: Hgb A1c MFr Bld  Date/Time Value Ref Range Status  06/17/2024 05:00 AM 6.5 (H) 4.8 - 5.6 % Final    Comment:    (NOTE) Diagnosis of Diabetes The following HbA1c ranges recommended by the American Diabetes Association (ADA) may be used as an aid in the diagnosis of diabetes mellitus.  Hemoglobin             Suggested A1C NGSP%              Diagnosis  <5.7                   Non Diabetic  5.7-6.4                Pre-Diabetic  >6.4                   Diabetic  <7.0                   Glycemic control for                       adults with diabetes.    04/30/2024 06:50 PM 6.9 (H) 4.8 - 5.6 % Final    Comment:    (NOTE)         Prediabetes: 5.7 - 6.4         Diabetes: >6.4         Glycemic control for adults with diabetes: <7.0     CBG: No results for input(s): GLUCAP in the last 168 hours.  Review of Systems:   Please see the history of present illness. All other systems reviewed and are negative    Past Medical History:  She,  has a past medical history of Cancer (HCC), Degenerative joint disease, Depression, Diabetes mellitus, type 2 (HCC), Fundic gland polyps of stomach, benign, GERD (gastroesophageal reflux disease), Gout, Helicobacter pylori gastritis, Hiatal hernia, Hypertension, Lumbar spinal stenosis, Osteoarthritis, Shingles, and Tubular adenoma of colon (07/17/2011).   Surgical History:    Past Surgical History:  Procedure Laterality Date   AMPUTATION Left 03/04/2016   Procedure: FOOT FIRST RAY AMPUTATION;  Surgeon: Jerona Harden GAILS, MD;  Location: [facility] OR;  Service: Orthopedics;  Laterality: Left;   benign br. biopsy- many yrs. ago     BREAST EXCISIONAL BIOPSY Right    CARPAL TUNNEL RELEASE     bilateral   CATARACT EXTRACTION     bilateral, /w IOL   CHOLECYSTECTOMY  1998   HEMORRHOID SURGERY     x 2   LUMBAR LAMINECTOMY     6 prior back surgery    RETINAL LASER PROCEDURE     bilateral   ROTATOR CUFF REPAIR     bilateral   SPINAL FUSION  08/22/2011   Procedure: FUSION POSTERIOR SPINAL MULTILEVEL/SCOLIOSIS;  Surgeon: GORMAN Ozell Mans;  Location: [facility] OR;  Service: Orthopedics;  Laterality: N/A;  LUMBAR LAMINECTOMY, EXTENSION OF POSTERIOR LUMBAR FUSION TO T10 WITH K2M RODS, SCREWS, CONNECTORS, HOOKS, ILIAC CREST BONE GRAFT   TONSILLECTOMY     TOTAL KNEE ARTHROPLASTY     left   VAGINAL HYSTERECTOMY  1990's     Social History:   reports that she has never smoked. She has never used smokeless tobacco. She reports that she does not drink alcohol  and does not use drugs.   Family History:  Her family history includes Cancer in her father and mother; Colon polyps in her sister; Diabetes in her brother, mother, paternal aunt, paternal uncle, and another family member; Heart disease in her brother and father; Liver cancer in her father. There is no history of Colon cancer, Anesthesia problems, Hypotension, Malignant hyperthermia, or Pseudochol deficiency.   Allergies Allergies  Allergen Reactions   Celebrex [Celecoxib] Swelling   Acarbose Other (See Comments)    Gas    Amoxicillin  Hives   Amoxicillin -Pot Clavulanate Diarrhea   Bisoprolol     Other Reaction(s): fatigue   Diltiazem Other (See Comments)    Headaches   Duloxetine Nausea And Vomiting   Fluoxetine Other (See Comments)    Gi upset    Gabapentin Other (See Comments)    Blurred vision    Lisinopril Cough    Meloxicam Other (See Comments)    Fatigue    Metoprolol Dermatitis   Pregabalin  Other (See Comments)    Fatigue    Sitagliptin Other (See Comments)    Crazy thoughts    Warfarin Diarrhea   Morphine Sulfate Nausea Only   Percocet [Oxycodone-Acetaminophen ] Itching   Robinul  [Glycopyrrolate ] Rash     Home Medications  Prior to Admission medications   Medication Sig Start Date End Date Taking? Authorizing Provider  acetaminophen  (8 HR ARTHRITIS PAIN RELIEF) 650 MG CR tablet Take 650 mg by mouth as needed for pain.    [provider]  allopurinol  (ZYLOPRIM ) 300 MG tablet Take 300 mg by mouth daily.    [provider]  amLODipine  (NORVASC ) 5 MG tablet Take 5 mg by mouth daily.      [provider]  aspirin  EC 81 MG tablet Take 81 mg by mouth daily.    [provider]  B Complex Vitamins (VITAMIN-B COMPLEX PO) Take 1 tablet by mouth daily in the afternoon.    [provider]  Calcium  Carb-Cholecalciferol (CALCIUM  PLUS VITAMIN D3) 600-20 MG-MCG TABS Take 1 tablet by mouth daily in the afternoon.    [provider]  diclofenac  sodium (VOLTAREN ) 1 % GEL Apply 2 g topically 4 (four) times daily. Patient taking differently: Apply 2 g topically as needed. 07/02/18   Vernetta Lonni GRADE, MD  escitalopram  (LEXAPRO ) 5 MG tablet Take 5 mg by mouth daily. 08/31/22   [provider]  hyoscyamine  (LEVSIN  SL) 0.125  MG SL tablet Place 1 tablet (0.125 mg total) under the tongue every 6 (six) hours as needed. 04/20/23   Beather Delon Gibson, PA  LORazepam  (ATIVAN ) 0.5 MG tablet Take 1 tablet (0.5 mg total) by mouth daily as needed for anxiety. Wean off this medication over the next few weeks (Discuss with PCP) 06/18/24   Lue Elsie BROCKS, MD  methocarbamol  (ROBAXIN ) 500 MG tablet Take 1 tablet (500 mg total) by mouth 3 (three) times daily as needed (muscle spasm/pain). 03/07/22   Steinl, Kevin, MD  omeprazole  (PRILOSEC) 20 MG capsule Take 20 mg  by mouth daily.    [provider]  ramipril  (ALTACE ) 10 MG capsule Take 10 mg by mouth daily.      [provider]     Critical care time:    50 minutes       CRITICAL CARE Performed by: Leita SAUNDERS Courtland Reas   Total critical care time: 50 minutes  Critical care time was exclusive of separately billable procedures and treating other patients.  Critical care was necessary to treat or prevent imminent or life-threatening deterioration.  Critical care was time spent personally by me on the following activities: development of treatment plan with patient and/or surrogate as well as nursing, discussions with consultants, evaluation of patient's response to treatment, examination of patient, obtaining history from patient or surrogate, ordering and performing treatments and interventions, ordering and review of laboratory studies, ordering and review of radiographic studies, pulse oximetry and re-evaluation of patient's condition.   Leita SAUNDERS Lillieann Pavlich, PA-C [facility] See Amion for pager If no response to pager , please call [phone] until 7pm After 7:00 pm call Elink  [phone]

## 2024-09-18 NOTE — TOC CM/SW Note (Signed)
 TOC consult received as patient's family is concerned regarding patient safety at home. Possible need for SNF vs. HH. Therapy recs would be needed for placement. Per notes, patient is currently alert and oriented, therefore able to make her own decisions. No records noted regarding a lack of capacity. She has previously declined HH services. Unit CM to f/u with patient as appropriate.  Merilee Batty, MSN, RN Case Management [phone]

## 2024-09-18 NOTE — ED Provider Notes (Signed)
 [place] EMERGENCY DEPARTMENT AT [facility] Provider Note   CSN: [id number] Arrival date & time: 09/18/24  1148     Patient presents with: Bradycardia and Failure To Thrive   [name] is a 89 y.o. female.  With a history of type 2 diabetes, dementia, hypertension and PE not on anticoagulation who presents to the ED for bradycardia.  EMS was called to patient's house given concern for dehydration, diarrhea and poor p.o. intake over the last week.  Son states she is unlikely to have been taking her medication in the last week.  EMS noted to be bradycardic and gave 1 mg atropine which did not provide any significant change.  They also initiated IV fluids.  Patient here states she is thirsty.  She is oriented to person place and time has no systemic complaints other than being thirsty.  Denies chest pain shortness of breath nausea vomiting fevers chills recent illness.  No prior history of heart block or bradycardia documented   HPI     Prior to Admission medications   Medication Sig Start Date End Date Taking? Authorizing Provider  acetaminophen  (8 HR ARTHRITIS PAIN RELIEF) 650 MG CR tablet Take 650 mg by mouth as needed for pain.    [provider]  allopurinol  (ZYLOPRIM ) 300 MG tablet Take 300 mg by mouth daily.    [provider]  amLODipine  (NORVASC ) 5 MG tablet Take 5 mg by mouth daily.      [provider]  aspirin  EC 81 MG tablet Take 81 mg by mouth daily.    [provider]  B Complex Vitamins (VITAMIN-B COMPLEX PO) Take 1 tablet by mouth daily in the afternoon.    [provider]  Calcium  Carb-Cholecalciferol (CALCIUM  PLUS VITAMIN D3) 600-20 MG-MCG TABS Take 1 tablet by mouth daily in the afternoon.    [provider]  diclofenac  sodium (VOLTAREN ) 1 % GEL Apply 2 g topically 4 (four) times daily. Patient taking differently: Apply 2 g topically as needed. 07/02/18   Vernetta Lonni GRADE, MD  escitalopram   (LEXAPRO ) 5 MG tablet Take 5 mg by mouth daily. 08/31/22   [provider]  hyoscyamine  (LEVSIN  SL) 0.125 MG SL tablet Place 1 tablet (0.125 mg total) under the tongue every 6 (six) hours as needed. 04/20/23   Beather Delon Gibson, PA  LORazepam  (ATIVAN ) 0.5 MG tablet Take 1 tablet (0.5 mg total) by mouth daily as needed for anxiety. Wean off this medication over the next few weeks (Discuss with PCP) 06/18/24   Lue Elsie BROCKS, MD  methocarbamol  (ROBAXIN ) 500 MG tablet Take 1 tablet (500 mg total) by mouth 3 (three) times daily as needed (muscle spasm/pain). 03/07/22   Bernard Drivers, MD  omeprazole  (PRILOSEC) 20 MG capsule Take 20 mg by mouth daily.    [provider]  ramipril  (ALTACE ) 10 MG capsule Take 10 mg by mouth daily.      [provider]    Allergies: Celebrex [celecoxib], Acarbose, Amoxicillin , Amoxicillin -pot clavulanate, Bisoprolol, Diltiazem, Duloxetine, Fluoxetine, Gabapentin, Lisinopril, Meloxicam, Metoprolol, Pregabalin , Sitagliptin, Warfarin, Morphine sulfate, Percocet [oxycodone-acetaminophen ], and Robinul  [glycopyrrolate ]    Review of Systems  Updated Vital Signs BP (!) 150/60 (BP Location: Right Arm)   Pulse (!) 29   Temp (!) 96.7 F (35.9 C) (Oral)   Resp 13   Ht 5' 1 (1.549 m)   Wt 55.8 kg   SpO2 96%   BMI 23.24 kg/m   Physical Exam Vitals and nursing note reviewed.  HENT:     Head: Normocephalic and atraumatic.  Eyes:     Pupils: Pupils are equal, round, and reactive to light.  Cardiovascular:     Rate and Rhythm: Regular rhythm. Bradycardia present.  Pulmonary:     Effort: Pulmonary effort is normal.     Breath sounds: Normal breath sounds.  Abdominal:     Palpations: Abdomen is soft.     Tenderness: There is no abdominal tenderness.  Skin:    General: Skin is warm and dry.  Neurological:     General: No focal deficit present.     Mental Status: She is alert.     Sensory: No sensory deficit.     Motor: No weakness.   Psychiatric:        Mood and Affect: Mood normal.     (all labs ordered are listed, but only abnormal results are displayed) Labs Reviewed  COMPREHENSIVE METABOLIC PANEL WITH GFR - Abnormal; Notable for the following components:      Result Value   Potassium 3.2 (*)    Chloride 119 (*)    CO2 13 (*)    Glucose, Bld 178 (*)    BUN 42 (*)    Creatinine, Ser 1.29 (*)    Calcium  6.1 (*)    Total Protein 3.9 (*)    Albumin 2.1 (*)    GFR, Estimated 38 (*)    All other components within normal limits  CBC WITH DIFFERENTIAL/PLATELET - Abnormal; Notable for the following components:   WBC 12.5 (*)    RBC 3.40 (*)    Hemoglobin 10.0 (*)    HCT 31.8 (*)    Neutro Abs 9.1 (*)    All other components within normal limits  MAGNESIUM  - Abnormal; Notable for the following components:   Magnesium  1.0 (*)    All other components within normal limits  I-STAT CHEM 8, ED - Abnormal; Notable for the following components:   Potassium 3.1 (*)    Chloride 116 (*)    BUN 47 (*)    Creatinine, Ser 1.20 (*)    Glucose, Bld 161 (*)    Calcium , Ion 0.78 (*)    TCO2 12 (*)    Hemoglobin 7.8 (*)    HCT 23.0 (*)    All other components within normal limits    EKG: EKG Interpretation Date/Time:  Thursday September 18 2024 12:01:32 EST Ventricular Rate:  28 PR Interval:  88 QRS Duration:  150 QT Interval:  681 QTC Calculation: 465 R Axis:   102  Text Interpretation: Third degree heart block Prolonged PR interval Right bundle branch block Abnrm T, consider ischemia, anterolateral lds Confirmed by Pamella Sharper [phone]) on 09/18/2024 12:08:14 PM  Radiology: DG Chest Portable 1 View Result Date: 09/18/2024 CLINICAL DATA:  Poor p.o. intake.  Diarrhea. EXAM: PORTABLE CHEST 1 VIEW COMPARISON:  08/01/2024 FINDINGS: Lungs are hypoinflated without airspace consolidation or effusion. Cardiomediastinal silhouette and remainder of the exam is unchanged. IMPRESSION: Hypoinflation without acute  cardiopulmonary disease. Electronically Signed   By: Toribio Agreste M.D.   On: 09/18/2024 13:27     .Critical Care  Performed by: Pamella Sharper LABOR, DO Authorized by: Pamella Sharper LABOR, DO   Critical care provider statement:    Critical care time (minutes):  70   Critical care was necessary to treat or prevent imminent or life-threatening deterioration of the following conditions:  Cardiac failure and metabolic crisis   Critical care was time spent personally by me on the following  activities:  Development of treatment plan with patient or surrogate, discussions with consultants, evaluation of patient's response to treatment, examination of patient, ordering and review of laboratory studies, ordering and review of radiographic studies, ordering and performing treatments and interventions, pulse oximetry, re-evaluation of patient's condition, review of old charts and obtaining history from patient or surrogate   I assumed direction of critical care for this patient from another provider in my specialty: no     Care discussed with: admitting provider      Medications Ordered in the ED  magnesium  sulfate IVPB 2 g 50 mL (0 g Intravenous Stopped 09/18/24 1334)  sodium chloride  0.9 % bolus 1,000 mL (0 mLs Intravenous Stopped 09/18/24 1334)  calcium  gluconate 1 g/ 50 mL sodium chloride  IVPB (0 mg Intravenous Stopped 09/18/24 1334)    Clinical Course as of 09/18/24 1422  Thu Sep 18, 2024  1230 Discussed with cardiology team who will evaluate patient in the ED.  Recommend medical admission [MP]  1330 Critical hypocalcemia and hypomagnesemia both of which we we will replete in the ED [MP]  1400 Discussed with ICU team who will evaluate patient in the ED and plan for ICU admission.  She has remained stable to this point with blood pressure 140s 150s systolic.  Remains bradycardic. [MP]    Clinical Course User Index [MP] Pamella Ozell LABOR, DO                                 Medical Decision  Making 89 year old female with history as above presented to the ED for failure to thrive at home.  Noted to be bradycardic.  EMS rhythm strips look like third-degree heart block.  No hypotension.  Short run of nonsustained V. tach on EMS rhythm strips as well.  Here in the ED she is hemodynamically stable awake alert oriented but still in third-degree heart block.  We have placed her on defibrillator pads.  Concern for severe electrolyte imbalance given diarrhea poor p.o. intake.  Will provide IV magnesium  and IV fluids to start and await the rest of her labs.  Will reach out to cardiology.  Should her blood pressure dropped abruptly will initiate transcutaneous pacing  Amount and/or Complexity of Data Reviewed Labs: ordered. Radiology: ordered.  Risk Prescription drug management. Decision regarding hospitalization.        Final diagnoses:  Complete heart block (HCC)  Hypomagnesemia  Hypocalcemia  Failure to thrive in adult    ED Discharge Orders     None          Pamella Ozell LABOR, DO 09/18/24 1422

## 2024-09-19 LAB — GLUCOSE, CAPILLARY: Glucose-Capillary: 234 mg/dL — ABNORMAL HIGH (ref 70–99)

## 2024-09-19 MED ORDER — GLYCOPYRROLATE 0.2 MG/ML IJ SOLN: 0.2000 mg | INTRAMUSCULAR | Status: DC | PRN

## 2024-09-19 MED ORDER — MIDAZOLAM HCL (PF) 2 MG/2ML IJ SOLN
2.0000 mg | INTRAMUSCULAR | Status: DC | PRN
  Administered 2024-09-19 – 2024-09-20 (×3): 2 mg via INTRAVENOUS
  Filled 2024-09-19 (×3): qty 2

## 2024-09-19 MED ORDER — EPINEPHRINE HCL 5 MG/250ML IV SOLN IN NS: 0.5000 ug/min | INTRAVENOUS | Status: DC

## 2024-09-19 MED ORDER — SODIUM CHLORIDE 0.9 % IV SOLN: INTRAVENOUS | Status: AC

## 2024-09-19 MED ORDER — ACETAMINOPHEN 650 MG RE SUPP: 650.0000 mg | Freq: Four times a day (QID) | RECTAL | Status: DC | PRN

## 2024-09-19 MED ORDER — ACETAMINOPHEN 325 MG PO TABS: 650.0000 mg | ORAL_TABLET | Freq: Four times a day (QID) | ORAL | Status: DC | PRN

## 2024-09-19 MED ORDER — ONDANSETRON HCL 4 MG/2ML IJ SOLN: 4.0000 mg | Freq: Four times a day (QID) | INTRAMUSCULAR | Status: DC | PRN

## 2024-09-19 MED ORDER — GLYCOPYRROLATE 1 MG PO TABS: 1.0000 mg | ORAL_TABLET | ORAL | Status: DC | PRN

## 2024-09-19 MED ORDER — MORPHINE SULFATE (PF) 2 MG/ML IV SOLN
2.0000 mg | INTRAVENOUS | Status: DC | PRN
  Administered 2024-09-19 – 2024-09-20 (×2): 2 mg via INTRAVENOUS
  Filled 2024-09-19 (×2): qty 1

## 2024-09-19 MED ORDER — INFLUENZA VAC SPLIT HIGH-DOSE 0.5 ML IM SUSY
0.5000 mL | PREFILLED_SYRINGE | INTRAMUSCULAR | Status: DC
  Filled 2024-09-19: qty 0.5

## 2024-09-19 MED ORDER — POLYVINYL ALCOHOL 1.4 % OP SOLN: 1.0000 [drp] | Freq: Four times a day (QID) | OPHTHALMIC | Status: DC | PRN

## 2024-09-19 MED ORDER — ONDANSETRON 4 MG PO TBDP: 4.0000 mg | ORAL_TABLET | Freq: Four times a day (QID) | ORAL | Status: DC | PRN

## 2024-09-19 MED ORDER — DIPHENHYDRAMINE HCL 50 MG/ML IJ SOLN: 25.0000 mg | INTRAMUSCULAR | Status: DC | PRN

## 2024-09-19 NOTE — Progress Notes (Signed)
 Advanced Heart Failure Rounding Note  Cardiologist: None  Chief Complaint: CHB Subjective:    Denies pain.    Objective:   Weight Range: 55.8 kg Body mass index is 23.24 kg/m.   Vital Signs:   Temp:  [96.7 F (35.9 C)-98.5 F (36.9 C)] 98.5 F (36.9 C) (12/04 1700) Pulse Rate:  [24-31] 24 (12/05 0600) Resp:  [12-24] 17 (12/05 0600) BP: (150-230)/(54-60) 230/54 (12/04 1556) SpO2:  [89 %-100 %] 89 % (12/05 0600) Arterial Line BP: (65-229)/(32-94) 91/83 (12/05 0500) Weight:  [55.8 kg] 55.8 kg (12/04 1156)    Weight change: Filed Weights   09/18/24 1156  Weight: 55.8 kg    Intake/Output:   Intake/Output Summary (Last 24 hours) at 09/19/2024 9362 Last data filed at 09/18/2024 1736 Gross per 24 hour  Intake 1157.44 ml  Output --  Net 1157.44 ml      Physical Exam    General:   Elderly, frail. Thin Neck: no JVD.  Cor: Regular rate & rhythm.  Lungs: clear Extremities: no  edema Neuro: Opens eyes. Confused.    Telemetry  CHB 20s   EKG  N/A  Labs    CBC Recent Labs    09/18/24 1206 09/18/24 1214  WBC 12.5*  --   NEUTROABS 9.1*  --   HGB 10.0* 7.8*  HCT 31.8* 23.0*  MCV 93.5  --   PLT 181  --    Basic Metabolic Panel Recent Labs    87/95/74 1206 09/18/24 1214 09/18/24 1557  NA 141 144 137  K 3.2* 3.1* 4.2  CL 119* 116* 107  CO2 13*  --  15*  GLUCOSE 178* 161* 269*  BUN 42* 47* 54*  CREATININE 1.29* 1.20* 1.62*  CALCIUM  6.1*  --  8.8*  MG 1.0*  --  2.0  PHOS  --   --  3.9   Liver Function Tests Recent Labs    09/18/24 1206  AST 26  ALT 19  ALKPHOS 57  BILITOT 0.9  PROT 3.9*  ALBUMIN 2.1*   Recent Labs    09/18/24 1206  LIPASE 18   Cardiac Enzymes No results for input(s): CKTOTAL, CKMB, CKMBINDEX, TROPONINI in the last 72 hours.  BNP: BNP (last 3 results) Recent Labs    09/18/24 1702  BNP 3,023.8*    ProBNP (last 3 results) No results for input(s): PROBNP in the last 8760 hours.   D-Dimer No  results for input(s): DDIMER in the last 72 hours. Hemoglobin A1C No results for input(s): HGBA1C in the last 72 hours. Fasting Lipid Panel No results for input(s): CHOL, HDL, LDLCALC, TRIG, CHOLHDL, LDLDIRECT in the last 72 hours. Thyroid  Function Tests Recent Labs    09/18/24 1550  TSH 5.923*    Other results:   Imaging    CT ABDOMEN PELVIS WO CONTRAST Result Date: 09/18/2024 EXAM: CT ABDOMEN AND PELVIS WITHOUT CONTRAST 09/18/2024 03:34:00 PM TECHNIQUE: CT of the abdomen and pelvis was performed without the administration of intravenous contrast. Multiplanar reformatted images are provided for review. Automated exposure control, iterative reconstruction, and/or weight-based adjustment of the mA/kV was utilized to reduce the radiation dose to as low as reasonably achievable. COMPARISON: 08/01/2024 CLINICAL HISTORY: Abdominal pain, acute, nonlocalized. FINDINGS: LOWER CHEST: Small right pleural effusion and trace left pleural effusion. LIVER: The liver is unremarkable. GALLBLADDER AND BILE DUCTS: Prior cholecystectomy. No biliary ductal dilatation. SPLEEN: No acute abnormality. PANCREAS: No acute abnormality. ADRENAL GLANDS: No acute abnormality. KIDNEYS, URETERS AND BLADDER: Bilateral perinephric stranding has  increased since prior study and is nonspecific. No hydronephrosis or ureteral stones. Urinary bladder unremarkable. GI AND BOWEL: Stomach demonstrates no acute abnormality. Sigmoid diverticulosis. There is no bowel obstruction. PERITONEUM AND RETROPERITONEUM: Trace free fluid in the cul de sac. No free air. VASCULATURE: Aorta is normal in caliber. Diffuse aortic and scattered coronary artery calcifications. Densely calcified mitral valve annulus. LYMPH NODES: No lymphadenopathy. REPRODUCTIVE ORGANS: Prior hysterectomy. No adnexal mass. BONES AND SOFT TISSUES: Postoperative and degenerative changes in the lumbar spine. No acute osseous abnormality. No focal soft tissue  abnormality. IMPRESSION: 1. No acute findings in the abdomen or pelvis. 2. Coronary artery disease, aortic atherosclerosis. 3. Sigmoid diverticulosis. Electronically signed by: Franky Crease MD 09/18/2024 03:48 PM EST RP Workstation: [id number]   DG Chest Portable 1 View Result Date: 09/18/2024 CLINICAL DATA:  Poor p.o. intake.  Diarrhea. EXAM: PORTABLE CHEST 1 VIEW COMPARISON:  08/01/2024 FINDINGS: Lungs are hypoinflated without airspace consolidation or effusion. Cardiomediastinal silhouette and remainder of the exam is unchanged. IMPRESSION: Hypoinflation without acute cardiopulmonary disease. Electronically Signed   By: Toribio Agreste M.D.   On: 09/18/2024 13:27     Medications:     Scheduled Medications:  Chlorhexidine  Gluconate Cloth  6 each Topical Daily    Infusions:  epinephrine  Stopped (09/19/24 0100)    PRN Medications: docusate sodium , polyethylene glycol    Assessment/Plan   Bradycardia, Third degree Heart Block - Rate remains n the 20s.  - TVP and pacemaker discussed with family, the patient has clearly stated her wishes to them and she would not want either of those options. -No plan for escalation   Hypertension - BP likely compensatory for bradycardia - would not attempt to lower    Failure to Thrive - she has not been eating or drinking for multiple days - unable to safety care for herself at home, refuses care facility   Elevated Troponin - hs-trop 110 - demand ischemia in the setting of heart block - no chest pain   Hypocalcemia, Hypokalemia, Hypomagnesemia   Heart failure team will sign off as of 09/19/24  Follow up as an outpatient in the HF clinic ?  No   Off all medications.No plan for escalation. Remove arterial line.   Recommend Palliative Care. Transfer to 6N. ? D/C to [facility].     Length of Stay: 1  Deaglan Lile, NP  09/19/2024, 6:37 AM  Advanced Heart Failure Team Pager [phone] (M-F; 7a - 5p)  Please contact [facility]  for night-coverage after hours (5p -7a ) and weekends on [email or web address] \

## 2024-09-19 NOTE — Progress Notes (Signed)
 NAME:  [name], MRN:  [id number], DOB:  [birth year], LOS: 1 ADMISSION DATE:  09/18/2024, CONSULTATION DATE:  09/19/24 REFERRING MD:  EDP, CHIEF COMPLAINT:  bradycardia   History of Present Illness:  [name] is a 89 y.o. F with PMH significant for recently diagnosed dementia, HTN, Type 2 DM, osteoarthritis and remote colon adenoma who presented to the ED after three days of diarrhea and poor po intake and was found to have 3rd degree HB with rate 20-30 without hypotension.  She lives alone with a sister who checks in with her, but family believes that she has had very little po intake for likely longer than this.  Family denies any hematochezia or melena, pt has had some shortness of breath but no chest pain, no cardiac hx.   She has complained of significant upper and lower abdominal pain.   Work-up revealed significant hypomagnesemia, hypocalcemia and hypokalemia with AKI.   CXR clear.  Mag 1.0, ionized Ca 0.78, creatinine 1.2, K 3.1, WBC 12.5, bili and LFT's WNL.   Cardiology was consulted and PCCM asked to admit in this setting   Family states that patient has expressed that she is ready to die and has been refusing to eat and drink because of this.  They do not think she would want any major interventions including a temporary pacemaker or pressors    Pertinent  Medical History   has a past medical history of Cancer (HCC), Degenerative joint disease, Depression, Diabetes mellitus, type 2 (HCC), Fundic gland polyps of stomach, benign, GERD (gastroesophageal reflux disease), Gout, Helicobacter pylori gastritis, Hiatal hernia, Hypertension, Lumbar spinal stenosis, Osteoarthritis, Shingles, and Tubular adenoma of colon (07/17/2011).   Significant Hospital Events: Including procedures, antibiotic start and stop dates in addition to other pertinent events   12/4 admit to ICU with bradycardia   Interim History / Subjective:  Patient remains severely bradycardic into the 20s with  stable blood pressure No overnight issues  Objective    Blood pressure (!) 230/54, pulse (!) 24, temperature (!) 97 F (36.1 C), temperature source Axillary, resp. rate 17, height 5' 1 (1.549 m), weight 55.8 kg, SpO2 (!) 89%.        Intake/Output Summary (Last 24 hours) at 09/19/2024 9078 Last data filed at 09/18/2024 1736 Gross per 24 hour  Intake 1157.44 ml  Output --  Net 1157.44 ml   Filed Weights   09/18/24 1156  Weight: 55.8 kg   Physical exam: General: Chronically ill-appearing elderly female, lying on the bed HEENT: [place]/AT, eyes anicteric.  Eye mucus membranes Neuro: Opens eyes with vocal stimuli, confused, not following commands Chest: Coarse breath sounds, no wheezes or rhonchi Heart: Bradycardic into 20s Abdomen: Soft, nontender, nondistended, bowel sounds present   Resolved problem list   Assessment and Plan  Complete heart block with escape junctional rhythm, heart rate in 20s Acute kidney injury due to ischemic ATN Hypocalcemia/hypokalemia/hypomagnesemia Advanced dementia Diabetes type 2 Hypertension Lactic acidosis Anemia of chronic disease  Spoke with patient's son, he is in agreement that patient would not want any aggressive measures, they would like to focus on comfort Comfort care orders written, will transfer patient to palliative care floor She is DNR DNI with comfort care  Labs   CBC: Recent Labs  Lab 09/18/24 1206 09/18/24 1214  WBC 12.5*  --   NEUTROABS 9.1*  --   HGB 10.0* 7.8*  HCT 31.8* 23.0*  MCV 93.5  --   PLT 181  --  Basic Metabolic Panel: Recent Labs  Lab 09/18/24 1206 09/18/24 1214 09/18/24 1557  NA 141 144 137  K 3.2* 3.1* 4.2  CL 119* 116* 107  CO2 13*  --  15*  GLUCOSE 178* 161* 269*  BUN 42* 47* 54*  CREATININE 1.29* 1.20* 1.62*  CALCIUM  6.1*  --  8.8*  MG 1.0*  --  2.0  PHOS  --   --  3.9   GFR: Estimated Creatinine Clearance: 16 mL/min (A) (by C-G formula based on SCr of 1.62 mg/dL (H)). Recent  Labs  Lab 09/18/24 1206 09/18/24 1654  WBC 12.5*  --   LATICACIDVEN  --  2.5*    Liver Function Tests: Recent Labs  Lab 09/18/24 1206  AST 26  ALT 19  ALKPHOS 57  BILITOT 0.9  PROT 3.9*  ALBUMIN 2.1*   Recent Labs  Lab 09/18/24 1206  LIPASE 18   No results for input(s): AMMONIA in the last 168 hours.  ABG    Component Value Date/Time   TCO2 12 (L) 09/18/2024 1214     Coagulation Profile: No results for input(s): INR, PROTIME in the last 168 hours.  Cardiac Enzymes: No results for input(s): CKTOTAL, CKMB, CKMBINDEX, TROPONINI in the last 168 hours.  HbA1C: Hgb A1c MFr Bld  Date/Time Value Ref Range Status  06/17/2024 05:00 AM 6.5 (H) 4.8 - 5.6 % Final    Comment:    (NOTE) Diagnosis of Diabetes The following HbA1c ranges recommended by the American Diabetes Association (ADA) may be used as an aid in the diagnosis of diabetes mellitus.  Hemoglobin             Suggested A1C NGSP%              Diagnosis  <5.7                   Non Diabetic  5.7-6.4                Pre-Diabetic  >6.4                   Diabetic  <7.0                   Glycemic control for                       adults with diabetes.    04/30/2024 06:50 PM 6.9 (H) 4.8 - 5.6 % Final    Comment:    (NOTE)         Prediabetes: 5.7 - 6.4         Diabetes: >6.4         Glycemic control for adults with diabetes: <7.0     CBG: Recent Labs  Lab 09/18/24 1612 09/19/24 0806  GLUCAP 235* 234*      Valinda Novas, MD [facility] See Amion for pager If no response to pager, please call [phone] until 7pm After 7pm, Please call E-link [phone]

## 2024-09-19 NOTE — Progress Notes (Signed)
 [facility] Liaison Note   Received request from TOC Alesia for family interest in [facility]. Met with patient's sister Rock, then son [name] and his wife at the bedside to discuss hospice philosophy and inpatient hospice care. Patient is approved for [facility] inpatient hospice care by Dr. Curlee Fines [facility], hospice provider.   Patient is not ready for discharge today and family is aware that [facility] hospital liaison will follow up tomorrow. Will need to check for bed availability at [facility] as well.   Please do not hesitate to call with questions.   Greig Basket, BSN, RN [employer] [phone]

## 2024-09-19 NOTE — Consult Note (Addendum)
 Palliative Care Consult Note                                  Date: 09/19/2024   Patient Name: [name]  DOB: Nov 15, 1927  MRN: [id number]  Age / Sex: 89 y.o., female  PCP: Gerome Brunet, DO Referring Physician: Harold Scholz, MD  Reason for Consultation: Establishing goals of care  HPI/Patient Profile: 89 y.o. female  with past medical history of dementia (recently diagnosed), type 2 DM, osteoarthritis, remote colon adenoma, and HTN  who presented to the ED on 09/18/2024 after 3 days of diarrhea and poor p.o. intake.  She was found to have third-degree heart block with rate 20-30.  Workup also revealed significant hypomagnesemia, hypokalemia, hypocalcemia, as well as AKI.  Palliative Medicine has been consulted for goals of care discussions and complex medical decision making.   Clinical Assessment and Goals of Care:   Extensive chart review has been completed including labs, vital signs, imaging, progress/consult notes, orders, medications and available advance directive documents.    Reviewed progress note from Dr. Harold.  After he spoke with patient's son this morning, patient was transition to comfort care and those orders were placed.   Patient assessed while still on 2H. She appears comfortable.  She briefly opens her eyes to voice, but quickly falls back asleep. No non-verbal signs of pain or discomfort noted. Respirations are even and unlabored. No excessive respiratory secretions noted. HR in the 20's.   I later spoke with her son by phone and introduced myself and the role of Palliative Medicine.  Created space and opportunity for son to express thoughts and feelings regarding current medical situation.   Confirmed with son that goal of care is comfort measures only, with focus on symptom management and allowing the natural course to occur.  Son shares that patient has expressed that she is ready to die, and that she did not  want any interventions to prolong life.  Discussed natural trajectory at end-of-life. Discussed that prognosis is hours to possible days.  Son reports that family was initially interested in transfer to inpatient hospice facility [facility]), but then decided they would prefer patient to remain in the hospital for end-of-life.    Review of Systems  Unable to perform ROS   Objective:   Primary Diagnoses: Present on Admission:  Heart block   Physical Exam Vitals reviewed.  Constitutional:      General: She is not in acute distress.    Appearance: She is ill-appearing.  Cardiovascular:     Rate and Rhythm: Bradycardia present.  Pulmonary:     Effort: No respiratory distress.  Neurological:     Mental Status: She is lethargic and confused.    Palliative Assessment/Data: PPS 10-20%     Assessment & Plan:   SUMMARY OF RECOMMENDATIONS   Continue comfort measures Hold on transfer to [facility] - family prefers patient to remain in the hospital for end-of-life PRN medications are available for symptom management and end-of-life PMT will continue to follow and support  Symptom Management:  Morphine  prn for pain or dyspnea Midazolam  (Versed ) prn for anxiety Glycopyrrolate  (ROBINUL ) for excessive secretions Ondansetron  (ZOFRAN ) prn for nausea Polyvinyl alcohol  (LIQUIFILM TEARS) prn for dry eyes Antiseptic oral rinse (BIOTENE) prn for dry mouth   Primary Decision Maker: NEXT OF KIN - son/[name]  Code Status/Advance Care Planning: DNR - comfort  Prognosis:  Hours - Days  Discharge Planning:  Anticipated Hospital Death    Thank you for allowing us  to participate in the care of [name]   Billing based on MDM: High  Problems Addressed: One acute or chronic illness or injury that poses a threat to life or bodily function  Amount and/or Complexity of Data: Category 1:Review of prior external note(s) from each unique source, Review of the result(s) of  each unique test, and Assessment requiring an independent historian(s)  Risks: Parenteral controlled substances and Decision not to resuscitate or to de-escalate care because of poor prognosis  Signed by: Recardo Loll, NP Palliative Medicine Team  Team Phone # [phone]  For individual providers, please see AMION

## 2024-09-19 NOTE — TOC Initial Note (Addendum)
 Transition of Care [facility]) - Initial/Assessment Note    Patient Details  Name: [name] MRN: [id number] Date of Birth: [birth year]  Transition of Care [facility]) CM/SW Contact:    Darlene Delcia Czar, RN Phone Number: [phone] 09/19/2024, 9:45 AM  Clinical Narrative:                 Referral received to arrange [facility]. Choice offered to son, [name]. Medicare.gov list placed on chart with ratings. Son requested [facility]. States he does not want pt transferred today.   Referral sent to [facility] rep, [name] for [facility]. Pt scheduled for transfer to [facility] for continued comfort measure.   Expected Discharge Plan: Hospice Medical Facility Barriers to Discharge: Continued Medical Work up   Patient Goals and CMS Choice   CMS Medicare.gov Compare Post Acute Care list provided to:: Patient Represenative (must comment) Choice offered to / list presented to : Adult Children      Expected Discharge Plan and Services   Discharge Planning Services: CM Consult Post Acute Care Choice: Residential Hospice Bed                                        Prior Living Arrangements/Services   Lives with:: Self Patient language and need for interpreter reviewed:: Yes        Need for Family Participation in Patient Care: Yes (Comment) Care giver support system in place?: Yes (comment)   Criminal Activity/Legal Involvement Pertinent to Current Situation/Hospitalization: No - Comment as needed  Activities of Daily Living      Permission Sought/Granted Permission sought to share information with : Family Supports, PCP Permission granted to share information with : Yes, Verbal Permission Granted  Share Information with NAME: [name]  Permission granted to share info w AGENCY: [facility]  Permission granted to share info w Relationship: son  Permission granted to share info w Contact Information: [phone]  Emotional Assessment    Attitude/Demeanor/Rapport: Lethargic          Admission diagnosis:  Hypocalcemia [E83.51] Complete heart block (HCC) [I44.2] Hypomagnesemia [E83.42] Heart block [I45.9] Failure to thrive in adult [R62.7] Patient Active Problem List   Diagnosis Date Noted   Heart block 09/18/2024   Hypomagnesemia 06/17/2024   Anemia 06/17/2024   Hypoglycemia 06/16/2024   HTN (hypertension) 06/16/2024   Hypothyroidism 06/16/2024   Vitamin D  deficiency 06/16/2024   Disorder associated with type 2 diabetes mellitus (HCC) 06/16/2024   DM (diabetes mellitus) (HCC) 06/16/2024   Dehydration 06/16/2024   UTI (urinary tract infection) 05/01/2024   Dizziness 04/30/2024   History of total left knee replacement 12/16/2020   Unilateral primary osteoarthritis, right knee 07/02/2018   Diabetic foot infection (HCC) 04/02/2016   Cellulitis of left foot 04/02/2016   Hypokalemia 04/02/2016   Wound infection    Foot abscess, left 03/04/2016   Cellulitis of left lower extremity 03/01/2016   Cellulitis 03/01/2016   Altered mental status 08/20/2014   Slurred speech 08/20/2014   Pulmonary embolism (HCC) 08/25/2011   Spinal stenosis of lumbar region at multiple levels 08/23/2011   Diabetes mellitus (HCC) 08/23/2011   Shingles    GERD (gastroesophageal reflux disease) 07/25/2011   Constipation 07/25/2011   PCP:  Darlene Brunet, DO Pharmacy:   [facility] [phone] - [place], [place] - [address] [place] [address] Phone: [phone] Fax: [phone]  [facility] [address] [place] [address] Phone: [phone] Fax: [phone]  [facility] - [place], [place] - [address] [facility] [address] [name] [facility] [place] [address] Phone: [phone] Fax: [phone]     Social Drivers of Health (SDOH) Social History: SDOH Screenings   Food Insecurity:  No Food Insecurity (06/16/2024)  Housing: Low Risk  (06/16/2024)  Recent Concern: Housing - High Risk (04/30/2024)  Transportation Needs: No Transportation Needs (06/16/2024)  Utilities: Not At Risk (06/16/2024)  Social Connections: Moderately Integrated (06/16/2024)  Recent Concern: Social Connections - Socially Isolated (04/30/2024)  Tobacco Use: Low Risk  (09/18/2024)   SDOH Interventions:     Readmission Risk Interventions    05/02/2024   10:40 AM  Readmission Risk Prevention Plan  Post Dischage Appt Complete  Medication Screening Complete  Transportation Screening Complete

## 2024-09-20 DIAGNOSIS — I442 Atrioventricular block, complete: Secondary | ICD-10-CM

## 2024-09-20 DIAGNOSIS — N179 Acute kidney failure, unspecified: Secondary | ICD-10-CM

## 2024-09-20 DIAGNOSIS — F0390 Unspecified dementia without behavioral disturbance: Secondary | ICD-10-CM

## 2024-09-20 DIAGNOSIS — Z7189 Other specified counseling: Secondary | ICD-10-CM

## 2024-09-20 MED ORDER — LORAZEPAM 2 MG/ML PO CONC
2.0000 mg | ORAL | Status: DC | PRN
  Administered 2024-09-20: 2 mg via ORAL
  Filled 2024-09-20: qty 1

## 2024-09-20 MED ORDER — OXYCODONE HCL 20 MG/ML PO CONC: 5.0000 mg | ORAL | Status: DC | PRN

## 2024-09-20 NOTE — Hospital Course (Signed)
 [name] is a 89 yo female with PMH dementia, HTN, DMII, OA, remote colon adenoma who presented to the ER with diarrhea and poor intake.  On workup she was found to have 3rd degree HB, rate 20s-30s without hypotension.  She was reported to live alone with a sister who checks on her but family believed patient was having poor intake for longer amount of time before admission.  Work-up revealed significant hypomagnesemia, hypocalcemia and hypokalemia with AKI.   CXR clear.  Mag 1.0, ionized Ca 0.78, creatinine 1.2, K 3.1, WBC 12.5, bili and LFT's WNL.   Cardiology was consulted and PCCM asked to admit in this setting. Family states that patient has expressed that she is ready to die and has been refusing to eat and drink because of this. They did not think she would want any major interventions including a temporary pacemaker or pressors. Palliative care was also consulted to assist with symptom management as well.

## 2024-09-20 NOTE — Assessment & Plan Note (Signed)
-   EKG showed 3rd degree HB on admission; rate 20s to 30s, no hypotension - cardiology was also consulted  - after discussions with family and cardiology/PCCM, family elected to transition to comfort care/end of life care and that patient would not want pacing or advanced measures  - continue comfort care; palliative care following

## 2024-09-20 NOTE — Progress Notes (Signed)
 Progress Note    [name]   FMW:[id number]  DOB: 1928/01/28  DOA: 09/18/2024     2 PCP: Darlene Brunet, DO  Initial CC: bradycardia  Hospital Course: [name] is a 89 yo female with PMH dementia, HTN, DMII, OA, remote colon adenoma who presented to the ER with diarrhea and poor intake.  On workup she was found to have 3rd degree HB, rate 20s-30s without hypotension.  She was reported to live alone with a sister who checks on her but family believed patient was having poor intake for longer amount of time before admission.  Work-up revealed significant hypomagnesemia, hypocalcemia and hypokalemia with AKI.   CXR clear.  Mag 1.0, ionized Ca 0.78, creatinine 1.2, K 3.1, WBC 12.5, bili and LFT's WNL.   Cardiology was consulted and PCCM asked to admit in this setting. Family states that patient has expressed that she is ready to die and has been refusing to eat and drink because of this. They did not think she would want any major interventions including a temporary pacemaker or pressors. Palliative care was also consulted to assist with symptom management as well.   Interval History:  Resting in bed; asleep but awakens easily. Asked for some water. Held cup while she drank. Some coughing after but cleared easily. Still very bradycardic consistent with CHB on admission.  [facility] beds this [facility] at [facility].  Assessment and Plan: * Complete heart block (HCC) - EKG showed 3rd degree HB on admission; rate 20s to 30s, no hypotension - cardiology was also consulted  - after discussions with family and cardiology/PCCM, family elected to transition to comfort care/end of life care and that patient would not want pacing or advanced measures  - continue comfort care; palliative care following   Goals of care, counseling/discussion - Discussion held after admission with cardiology, critical care, family.  They have elected to focus on comfort care and end-of-life care without pursuing  aggressive workup or treatment -Palliative care assisting with symptom management -Tentative plan to discharge to [facility] if remains stable for transport  AKI (acute kidney injury) - baseline creatinine ~ 0.8 - patient presents with increase in creat >0.3 mg/dL above baseline or creat increase >1.5x baseline presumed to have occurred within past 7 days PTA - Creatinine 1.29 on admission and trending up; presumed prolonged prerenal possibly causing ATN - Regardless focus now is comfort care   Dementia without behavioral disturbance (HCC) - Continue comfort care focus   Antimicrobials:   DVT prophylaxis:  SCDs Start: 09/18/24 1434   Code Status:   Code Status: Do not attempt resuscitation (DNR) - Comfort care  Mobility Assessment (Last 72 Hours)     Mobility Assessment     Row Name 09/19/24 2256 09/19/24 1552 09/19/24 0800 09/18/24 1749     Does the patient have exclusion criteria? Yes- Hold (Level 0) - Assessment complete Yes- Hold (Level 0) - Assessment complete Yes- Hold (Level 0) - Assessment complete Yes- Bedfast (Level 1) - Select exclusion criteria in next row    Mobility Assessment Exclusion Criteria Acute decline in neurological status Acute decline in neurological status Order for bedrest (clairify order) MS/PC:  Medically unstable;Dysrhythmias requiring new antiarrhythmic medications within 12 hours    What is the highest level of mobility based on the mobility assessment? Level 0 (Hold) - At risk for rapid decline or arrest with movement or actively dying Level 0 (Hold) - At risk for rapid decline or arrest with movement or actively  dying Level 0 (Hold) - At risk for rapid decline or arrest with movement or actively dying --       Diet: Diet Orders (From admission, onward)     Start     Ordered   09/18/24 1435  Diet NPO time specified  Diet effective now        09/18/24 1436            Barriers to discharge: Awaiting [facility] beds Disposition Plan:  [facility] orders placed: N/A Status is: Inpatient  Objective: Blood pressure (!) 147/105, pulse (!) 48, temperature (!) 97.3 F (36.3 C), temperature source Axillary, resp. rate 16, height 5' 1 (1.549 m), weight 55 kg, SpO2 98%.  Examination:  Physical Exam Constitutional:      Comments: Pleasant, confused, no distress. Awake  HENT:     Head: Normocephalic and atraumatic.     Mouth/Throat:     Mouth: Mucous membranes are moist.  Eyes:     Extraocular Movements: Extraocular movements intact.  Cardiovascular:     Rate and Rhythm: Regular rhythm. Bradycardia present.  Pulmonary:     Effort: Pulmonary effort is normal. No respiratory distress.     Breath sounds: Normal breath sounds. No wheezing.  Abdominal:     General: Bowel sounds are normal. There is no distension.     Palpations: Abdomen is soft.     Tenderness: There is no abdominal tenderness.  Musculoskeletal:        General: Normal range of motion.     Cervical back: Normal range of motion and neck supple.  Skin:    General: Skin is warm and dry.  Neurological:     Mental Status: She is disoriented.      Consultants:  Palliative care Cardiology Critical care  Procedures:    Data Reviewed: No results found for this or any previous visit (from the past 24 hours).  I have reviewed pertinent nursing notes, vitals, labs, and images as necessary. I have ordered labwork to follow up on as indicated.  I have reviewed the last notes from staff over past 24 hours. I have discussed patient's care plan and test results with nursing staff, CM/SW, and other staff as appropriate.  Old records reviewed in assessment of this patient  Time spent: Greater than 50% of the 55 minute visit was spent in counseling/coordination of care for the patient as laid out in the A&P.   LOS: 2 days   Alm Apo, MD [facility] 09/20/2024, 12:08 PM

## 2024-09-20 NOTE — Assessment & Plan Note (Signed)
-   baseline creatinine ~ 0.8 - patient presents with increase in creat >0.3 mg/dL above baseline or creat increase >1.5x baseline presumed to have occurred within past 7 days PTA - Creatinine 1.29 on admission and trending up; presumed prolonged prerenal possibly causing ATN - Regardless focus now is comfort care

## 2024-09-20 NOTE — Plan of Care (Signed)
  Problem: Pain Managment: Goal: General experience of comfort will improve and/or be controlled Outcome: Progressing   Problem: Safety: Goal: Ability to remain free from injury will improve Outcome: Progressing

## 2024-09-20 NOTE — Progress Notes (Signed)
 Palliative Medicine Progress Note   Patient Name: [name]       Date: 09/20/2024 DOB: 1928-10-13  Age: 89 y.o. MRN#: [id number] Attending Physician: Patsy Lenis, MD Primary Care Physician: Gerome Brunet, DO Admit Date: 09/18/2024  Reason for Consultation/Follow-up: {Reason for Consult:23484}  HPI/Patient Profile: 89 y.o. female  with past medical history of dementia (recently diagnosed), type 2 DM, osteoarthritis, remote colon adenoma, and HTN  who presented to the ED on 09/18/2024 after 3 days of diarrhea and poor p.o. intake.  She was found to have third-degree heart block with rate 20-30.  Workup also revealed significant hypomagnesemia, hypokalemia, hypocalcemia, as well as AKI.   Palliative Medicine has been consulted for goals of care discussions and complex medical decision making.   Subjective: Chart reviewed. Per MAR, in the past 24 hours patient received 2 mg IV morphine  x 2 doses and 2 mg IV versed  x 3 doses.   Update received from RN. She states patient was asking for water earlier, but there is NPO order, so she gave her a swab with water and did note she had slight coughing.   Objective:  Physical Exam          Vital Signs: BP (!) 147/105 (BP Location: Left Arm)   Pulse (!) 48   Temp (!) 97.3 F (36.3 C) (Axillary)   Resp 16   Ht 5' 1 (1.549 m)   Wt 55 kg   SpO2 98%   BMI 22.91 kg/m  SpO2: SpO2: 98 % O2 Device: O2 Device: Room Air O2 Flow Rate:    Intake/output summary:  Intake/Output Summary (Last 24 hours) at 09/20/2024 1148 Last data filed at 09/20/2024 0300 Gross per 24 hour  Intake 378.99 ml  Output --  Net 378.99 ml    LBM:       Palliative Assessment/Data: ***     Palliative Medicine Assessment & Plan   Assessment: Principal  Problem:   Complete heart block (HCC) Active Problems:   Hypokalemia   HTN (hypertension)   DMII (diabetes mellitus, type 2) (HCC)   Hypomagnesemia   Anemia of chronic disease   Dementia without behavioral disturbance (HCC)   Hypocalcemia   AKI (acute kidney injury)    Recommendations/Plan: ***  Goals of Care and Additional Recommendations: Limitations on Scope of Treatment: {Recommended Scope  and Preferences:21019}  Code Status:   Prognosis:  {Palliative Care Prognosis:23504}  Discharge Planning: {Palliative dispostion:23505}  Care plan was discussed with ***  Thank you for allowing the Palliative Medicine Team to assist in the care of this patient.   ***   Recardo KATHEE Loll, NP   Please contact Palliative Medicine Team phone at [phone] for questions and concerns.  For individual providers, please see AMION.

## 2024-09-20 NOTE — Assessment & Plan Note (Signed)
-   Discussion held after admission with cardiology, critical care, family.  They have elected to focus on comfort care and end-of-life care without pursuing aggressive workup or treatment -Palliative care assisting with symptom management -Tentative plan to discharge to [facility] if remains stable for transport

## 2024-09-20 NOTE — Plan of Care (Signed)
  Problem: Pain Managment: Goal: General experience of comfort will improve and/or be controlled Outcome: Progressing

## 2024-09-20 NOTE — Assessment & Plan Note (Signed)
-   Continue comfort care focus

## 2024-09-20 NOTE — Progress Notes (Signed)
 Recardo Bowen, NP replied to secure chat, I can order oral solution once I get to my computer. Added oncoming nurse, Domnick Halon, RN to secure chat.

## 2024-09-20 NOTE — Progress Notes (Signed)
 RFA PIV discontinued d/t infiltration follow protocol and contacted pharmacy, spoke with Rocky and she advised no antidote is needed for normal saline infusion. Patient's affected arm is elevated and heat pack applied. LFA PIV discontinued d/t leaking. Per IV Team Arlon Lights, RN she attempted two times but was unsuccessful and advise to contact MD to change medication route. Notified Recardo Bowen, NP from with Palliative via secure chat and left vm at [phone] the phone number listed in Palliative Care Progress Note.

## 2024-09-21 DIAGNOSIS — Z66 Do not resuscitate: Secondary | ICD-10-CM

## 2024-09-21 MED ORDER — MORPHINE SULFATE (CONCENTRATE) 10 MG /0.5 ML PO SOLN: 10.0000 mg | ORAL | Status: DC | PRN

## 2024-09-21 MED ORDER — LORAZEPAM 2 MG/ML PO CONC: 2.0000 mg | ORAL | Status: DC | PRN

## 2024-09-21 MED ORDER — BIOTENE DRY MOUTH MT LIQD: 15.0000 mL | Freq: Two times a day (BID) | OROMUCOSAL | Status: DC

## 2024-10-16 NOTE — Plan of Care (Signed)
  Problem: Pain Managment: Goal: General experience of comfort will improve and/or be controlled Outcome: Progressing   Problem: Safety: Goal: Ability to remain free from injury will improve Outcome: Progressing

## 2024-10-16 NOTE — Progress Notes (Addendum)
 Daily Progress Note   Patient Name: [name]       Date: Oct 16, 2024 DOB: [birth year]  Age: 89 y.o. MRN#: [id number] Attending Physician: Patsy Lenis, MD Primary Care Physician: Gerome Brunet, DO Admit Date: 09/18/2024  Reason for Consultation/Follow-up: Non pain symptom management, Pain control, Psychosocial/spiritual support, and Terminal Care  Subjective: I have reviewed medical records including: EPIC notes: Nursing, hospitalist, PMT, hospice liaison, TOC, CCM, heart failure Vital signs stable for hospice transfer MAR: As needed medications administered over the last 24 hours-Ativan  x 1, Versed  x 1 Available advanced directives: In ACP - None. In shadow chart - DNR form Labs: Creatinine 1.6 to assessed for opioid prescribing and prognostication, WBC elevated 12.5, hemoglobin low 7.8, albumin 2.1 assessed for overall health, nutritional status, and disease severity, helping predict prognosis and guide care  Received report from primary RN -no acute concerns.  RN reports that patient was not as responsive as yesterday.  Per RN, patient's IV access was lost yesterday -will discontinue all IV orders and adjust for alternate routes.  It is okay to leave out IV.  Went to visit patient at bedside-no family/visitors present.  Patient was lying in bed unresponsive. No signs or non-verbal gestures of pain or discomfort noted. No respiratory distress, increased work of breathing, or secretions noted.  She appears as if her window for low risk transfers likely closing soon.  9:14 AM Called son/Mark-emotional support provided.  Discussed goal for patient's transfer to [facility].  Expressed concern that patient's window for low risk transfer is likely closing soon.  If patient's death appears  imminent [name] is okay with patient remaining in house for hospital death.  He understands we will provide ongoing assessments and continue to provide updates regarding stability for transfer.  Vital signs reviewed per his request, including heart rate.  10:14 AM Dr. Patsy provided updates that patient appears to be agonal breathing and likely not stable for transfer at this time.  [facility] transfer canceled.  He called to notify patient's family.  12:57 PM RN provided notification that patient passed away at 1254 under full comfort measures.  Son was able to visit with patient this morning prior to her passing.  Length of Stay: 3  Current Medications: Scheduled Meds:   Influenza vac split trivalent PF  0.5 mL Intramuscular Tomorrow-1000  Continuous Infusions:   PRN Meds: acetaminophen  **OR** acetaminophen , artificial tears, diphenhydrAMINE , glycopyrrolate  **OR** glycopyrrolate  **OR** glycopyrrolate , LORazepam , midazolam  PF, morphine  injection, ondansetron  **OR** ondansetron  (ZOFRAN ) IV, oxyCODONE   Physical Exam Vitals and nursing note reviewed.  Constitutional:      General: She is not in acute distress.    Appearance: She is ill-appearing.  Pulmonary:     Effort: No respiratory distress.  Skin:    General: Skin is warm and dry.  Neurological:     Mental Status: She is unresponsive.             Vital Signs: BP (!) 134/58 (BP Location: Left Arm)   Pulse (!) 52   Temp 98.2 F (36.8 C) (Axillary)   Resp 16   Ht 5' 1 (1.549 m)   Wt 55 kg   SpO2 98%   BMI 22.91 kg/m  SpO2: SpO2: 98 % O2 Device: O2 Device: Room Air O2 Flow Rate:    Intake/output summary:  Intake/Output Summary (Last 24 hours) at 09-30-24 9176 Last data filed at 09/20/2024 2135 Gross per 24 hour  Intake 5 ml  Output --  Net 5 ml   LBM:   Baseline Weight: Weight: 55.8 kg Most recent weight: Weight: 55 kg       Palliative Assessment/Data: PPS 10%      Patient Active Problem List    Diagnosis Date Noted   Dementia without behavioral disturbance (HCC) 09/20/2024   Hypocalcemia 09/20/2024   AKI (acute kidney injury) 09/20/2024   Goals of care, counseling/discussion 09/20/2024   Complete heart block (HCC) 09/18/2024   Hypomagnesemia 06/17/2024   Anemia of chronic disease 06/17/2024   Hypoglycemia 06/16/2024   HTN (hypertension) 06/16/2024   Hypothyroidism 06/16/2024   Vitamin D  deficiency 06/16/2024   Disorder associated with type 2 diabetes mellitus (HCC) 06/16/2024   DMII (diabetes mellitus, type 2) (HCC) 06/16/2024   Dehydration 06/16/2024   UTI (urinary tract infection) 05/01/2024   Dizziness 04/30/2024   History of total left knee replacement 12/16/2020   Unilateral primary osteoarthritis, right knee 07/02/2018   Diabetic foot infection (HCC) 04/02/2016   Cellulitis of left foot 04/02/2016   Hypokalemia 04/02/2016   Wound infection    Foot abscess, left 03/04/2016   Cellulitis of left lower extremity 03/01/2016   Cellulitis 03/01/2016   Altered mental status 08/20/2014   Slurred speech 08/20/2014   Pulmonary embolism (HCC) 08/25/2011   Spinal stenosis of lumbar region at multiple levels 08/23/2011   Diabetes mellitus (HCC) 08/23/2011   Shingles    GERD (gastroesophageal reflux disease) 07/25/2011   Constipation 07/25/2011    Palliative Care Assessment & Plan   Patient Profile: 89 y.o. female  with past medical history of dementia (recently diagnosed), type 2 DM, osteoarthritis, remote colon adenoma, and HTN  who presented to the ED on 09/18/2024 after 3 days of diarrhea and poor p.o. intake.  She was found to have third-degree heart block with rate 20-30.  Workup also revealed significant hypomagnesemia, hypokalemia, hypocalcemia, as well as AKI.   Palliative Medicine has been consulted for goals of care discussions and complex medical decision making.   Assessment: Principal Problem:   Complete heart block (HCC) Active Problems:   Hypokalemia    HTN (hypertension)   DMII (diabetes mellitus, type 2) (HCC)   Hypomagnesemia   Anemia of chronic disease   Dementia without behavioral disturbance (HCC)   Hypocalcemia   AKI (acute kidney injury)   Goals of care, counseling/discussion   Terminal care  Recommendations/Plan:  Patient passed away today at 1254 under full comfort measures. Comfort focused medication regimen adjusted as noted below prior to her passing  Symptom Management Discontinued IV morphine  due to lack of IV access; started morphine  concentrate PRN pain/dyspnea/increased work of breathing/RR>25 Tylenol  PRN pain/fever Started biotin twice daily Discontinued IV Benadryl  PRN itching due to lack of IV access Robinul  PRN secretions Adjusted ativan  concentrate to dose range 2-4mg  PRN anxiety/seizure/sleep/distress Discontinued IV versed  due to lack of IV access Zofran  PRN nausea/vomiting Liquifilm Tears PRN dry eye   Goals of Care and Additional Recommendations: Limitations on Scope of Treatment: Full Comfort Care  Code Status:    Code Status Orders  (From admission, onward)           Start     Ordered   09/19/24 0921  Do not attempt resuscitation (DNR) - Comfort care  Continuous       Question Answer Comment  If patient has no pulse and is not breathing Do Not Attempt Resuscitation   In Pre-Arrest Conditions (Patient Is Breathing and Has a Pulse) Provide comfort measures. Relieve any mechanical airway obstruction. Avoid transfer unless required for comfort.   Consent: Discussion documented in EHR or advanced directives reviewed      09/19/24 0921           Code Status History     Date Active Date Inactive Code Status Order ID Comments User Context   09/18/2024 1436 09/19/2024 0921 Do not attempt resuscitation (DNR) PRE-ARREST INTERVENTIONS DESIRED [id number]  Gleason, Leita [facility]ON[facility] ED   06/16/2024 1943 06/18/2024 1816 Full Code [id number]  Arthea Child, MD Inpatient   04/30/2024 1528 05/02/2024 1546  Full Code [id number]  Zella Katha HERO, MD ED   04/02/2016 2022 04/06/2016 1736 Full Code [id number]  Hilma Rankins, MD ED   03/01/2016 2027 03/07/2016 1646 Full Code [id number]  Drusilla Sabas RAMAN, MD Inpatient   08/20/2014 1935 08/22/2014 1726 Full Code [id number]  Jeannett Liming, DO Inpatient      Advance Directive Documentation    Flowsheet Row Most Recent Value  Type of Advance Directive Out of facility DNR (pink MOST or yellow form)  Pre-existing out of facility DNR order (yellow form or pink MOST form) --  MOST Form in Place? --    Prognosis: Deceased   Discharge Planning: Hospital death  Care plan was discussed with patient's son, primary RN, attending, TOC, hospice liaison  Thank you for allowing the Palliative Medicine Team to assist in the care of this patient. Billing based on MDM: High  Problems Addressed: One acute or chronic illness or injury that poses a threat to life or bodily function  Amount and/or Complexity of Data: Category 1:Review of prior external note(s) from each unique source, Review of the result(s) of each unique test, and Assessment requiring an independent historian(s) and Category 3:Discussion of management or test interpretation with external physician/other qualified health care professional/appropriate source (not separately reported)  Risks: Parenteral controlled substances     Elaijah Munoz HERO Sharps, NP  Please contact Palliative Medicine Team phone at [phone] for questions and concerns.   *Portions of this note are a verbal dictation therefore any spelling and/or grammatical errors are due to the Dragon [employer] One system interpretation.

## 2024-10-16 NOTE — Progress Notes (Signed)
 Contacted patient's son, Guadalupe Kerekes, about patient's personal belongings left at the nursing station for pick-up. [name] stated, I will come by to pick up the belongings tomorrow if the weather permits.

## 2024-10-16 NOTE — Progress Notes (Signed)
 Progress Note    [name]   FMW:[id number]  DOB: [birth year]  DOA: 09/18/2024     3 PCP: Gerome Brunet, DO  Initial CC: bradycardia  Hospital Course: [name] is a 89 yo female with PMH dementia, HTN, DMII, OA, remote colon adenoma who presented to the ER with diarrhea and poor intake.  On workup she was found to have 3rd degree HB, rate 20s-30s without hypotension.  She was reported to live alone with a sister who checks on her but family believed patient was having poor intake for longer amount of time before admission.  Work-up revealed significant hypomagnesemia, hypocalcemia and hypokalemia with AKI.   CXR clear.  Mag 1.0, ionized Ca 0.78, creatinine 1.2, K 3.1, WBC 12.5, bili and LFT's WNL.   Cardiology was consulted and PCCM asked to admit in this setting. Family states that patient has expressed that she is ready to die and has been refusing to eat and drink because of this. They did not think she would want any major interventions including a temporary pacemaker or pressors. Palliative care was also consulted to assist with symptom management as well.   Interval History:  Now becoming nonresponsive and noted to have agonal breathing during exam this morning.  Was unable to awaken even with sternal rub. Updated son on the phone regarding no longer stable for discharge and anticipating inpatient death, likely within the next 24 hours.  Assessment and Plan: * Complete heart block (HCC) - EKG showed 3rd degree HB on admission; rate 20s to 30s, no hypotension - cardiology was also consulted  - after discussions with family and cardiology/PCCM, family elected to transition to comfort care/end of life care and that patient would not want pacing or advanced measures  - continue comfort care; palliative care following   Goals of care, counseling/discussion - Discussion held after admission with cardiology, critical care, family.  They have elected to focus on comfort care and  end-of-life care without pursuing aggressive workup or treatment -Palliative care assisting with symptom management - Plan was for discharge to [facility], but she is now having agonal breathing and unresponsive this morning.  Anticipate in-hospital death.  No longer stable for transfer  AKI (acute kidney injury) - baseline creatinine ~ 0.8 - patient presents with increase in creat >0.3 mg/dL above baseline or creat increase >1.5x baseline presumed to have occurred within past 7 days PTA - Creatinine 1.29 on admission and trending up; presumed prolonged prerenal possibly causing ATN - Regardless focus now is comfort care   Dementia without behavioral disturbance (HCC) - Continue comfort care focus   Antimicrobials:   DVT prophylaxis:     Code Status:   Code Status: Do not attempt resuscitation (DNR) - Comfort care  Mobility Assessment (Last 72 Hours)     Mobility Assessment     Row Name Oct 19, 2024 0930 09/20/24 2135 09/20/24 1030 09/19/24 2256 09/19/24 1552   Does the patient have exclusion criteria? Yes- Hold (Level 0) - Assessment complete Yes- Hold (Level 0) - Assessment complete Yes- Hold (Level 0) - Assessment complete Yes- Hold (Level 0) - Assessment complete Yes- Hold (Level 0) - Assessment complete   Mobility Assessment Exclusion Criteria Acute decline in neurological status Acute decline in neurological status Acute decline in neurological status Acute decline in neurological status Acute decline in neurological status   What is the highest level of mobility based on the mobility assessment? Level 0 (Hold) - At risk for rapid decline or arrest with  movement or actively dying Level 0 (Hold) - At risk for rapid decline or arrest with movement or actively dying Level 0 (Hold) - At risk for rapid decline or arrest with movement or actively dying Level 0 (Hold) - At risk for rapid decline or arrest with movement or actively dying Level 0 (Hold) - At risk for rapid decline or arrest  with movement or actively dying    Row Name 09/19/24 0800 09/18/24 1749         Does the patient have exclusion criteria? Yes- Hold (Level 0) - Assessment complete Yes- Bedfast (Level 1) - Select exclusion criteria in next row      Mobility Assessment Exclusion Criteria Order for bedrest (clairify order) MS/PC:  Medically unstable;Dysrhythmias requiring new antiarrhythmic medications within 12 hours      What is the highest level of mobility based on the mobility assessment? Level 0 (Hold) - At risk for rapid decline or arrest with movement or actively dying --         Diet: Diet Orders (From admission, onward)     Start     Ordered   October 08, 2024 0911  Diet regular Room service appropriate? No; Fluid consistency: Thin  Diet effective now       Comments: May eat/drink as desires for EOL  Question Answer Comment  Room service appropriate? No   Fluid consistency: Thin      08-Oct-2024 0912            Barriers to discharge: Awaiting [facility] beds Disposition Plan: Now inpatient death anticipated  HH orders placed: N/A Status is: Inpatient  Objective: Blood pressure (!) 134/58, pulse (!) 52, temperature 98.2 F (36.8 C), temperature source Axillary, resp. rate 16, height 5' 1 (1.549 m), weight 55 kg, SpO2 98%.  Examination:  Physical Exam Constitutional:      Comments: Pleasant, confused, no distress. Awake  HENT:     Head: Normocephalic and atraumatic.     Mouth/Throat:     Mouth: Mucous membranes are moist.  Eyes:     Extraocular Movements: Extraocular movements intact.  Cardiovascular:     Rate and Rhythm: Regular rhythm. Bradycardia present.  Pulmonary:     Effort: Pulmonary effort is normal. No respiratory distress.     Breath sounds: Normal breath sounds. No wheezing.  Abdominal:     General: Bowel sounds are normal. There is no distension.     Palpations: Abdomen is soft.     Tenderness: There is no abdominal tenderness.  Musculoskeletal:        General:  Normal range of motion.     Cervical back: Normal range of motion and neck supple.  Skin:    General: Skin is warm and dry.  Neurological:     Mental Status: She is disoriented.      Consultants:  Palliative care Cardiology Critical care  Procedures:    Data Reviewed: No results found for this or any previous visit (from the past 24 hours).  I have reviewed pertinent nursing notes, vitals, labs, and images as necessary. I have ordered labwork to follow up on as indicated.  I have reviewed the last notes from staff over past 24 hours. I have discussed patient's care plan and test results with nursing staff, CM/SW, and other staff as appropriate.  Old records reviewed in assessment of this patient  Time spent: Greater than 50% of the 55 minute visit was spent in counseling/coordination of care for the patient as laid out in  the A&P.   LOS: 3 days   Alm Apo, MD [facility] 2024/09/26, 12:25 PM

## 2024-10-16 NOTE — Plan of Care (Signed)
   Problem: Coping: Goal: Ability to adjust to condition or change in health will improve Outcome: Progressing   Problem: Fluid Volume: Goal: Ability to maintain a balanced intake and output will improve Outcome: Progressing

## 2024-10-16 NOTE — Discharge Summary (Signed)
 Death Summary  [name] FMW:[id number] DOB: [birth year] DOA: Oct 18, 2024  PCP: Gerome Brunet, DO  Admit date: 2024-10-18 Date of Death: 10/21/24 Time of Death: 1254 Notification: Family notified of death   History of present illness:  [name] is a 89 yo female with PMH dementia, HTN, DMII, OA, remote colon adenoma who presented to the ER with diarrhea and poor intake.  On workup she was found to have 3rd degree HB, rate 20s-30s without hypotension.  She was reported to live alone with a sister who checks on her but family believed patient was having poor intake for longer amount of time before admission.  Work-up revealed significant hypomagnesemia, hypocalcemia and hypokalemia with AKI.    Cardiology was consulted and PCCM asked to admit in this setting. Family states that patient has expressed that she is ready to die and has been refusing to eat and drink because of this. They did not think she would want any major interventions including a temporary pacemaker or pressors. Palliative care was also consulted to assist with symptom management as well.  She was transitioned to comfort care in setting of expectant end-of-life care.  Comfort meds were initiated and comfort was achieved.  She continued to have expected progressive decline and passed naturally on 10-21-2024.  Final Diagnoses:  Complete heart block Dementia  Active Hospital Problems   Diagnosis Date Noted   Complete heart block (HCC) 2024-10-18    Priority: 1.   Goals of care, counseling/discussion 09/20/2024    Priority: 2.   Dementia without behavioral disturbance (HCC) 09/20/2024    Priority: 3.   AKI (acute kidney injury) 09/20/2024    Priority: 3.   Hypocalcemia 09/20/2024   Hypomagnesemia 06/17/2024   Anemia of chronic disease 06/17/2024   DMII (diabetes mellitus, type 2) (HCC) 06/16/2024   HTN (hypertension) 06/16/2024   Hypokalemia 04/02/2016    Resolved Hospital Problems  No resolved problems to  display.    The results of significant diagnostics from this hospitalization (including imaging, microbiology, ancillary and laboratory) are listed below for reference.    Significant Diagnostic Studies: CT ABDOMEN PELVIS WO CONTRAST Result Date: 2024/10/18 EXAM: CT ABDOMEN AND PELVIS WITHOUT CONTRAST 10-18-24 03:34:00 PM TECHNIQUE: CT of the abdomen and pelvis was performed without the administration of intravenous contrast. Multiplanar reformatted images are provided for review. Automated exposure control, iterative reconstruction, and/or weight-based adjustment of the mA/kV was utilized to reduce the radiation dose to as low as reasonably achievable. COMPARISON: 08/01/2024 CLINICAL HISTORY: Abdominal pain, acute, nonlocalized. FINDINGS: LOWER CHEST: Small right pleural effusion and trace left pleural effusion. LIVER: The liver is unremarkable. GALLBLADDER AND BILE DUCTS: Prior cholecystectomy. No biliary ductal dilatation. SPLEEN: No acute abnormality. PANCREAS: No acute abnormality. ADRENAL GLANDS: No acute abnormality. KIDNEYS, URETERS AND BLADDER: Bilateral perinephric stranding has increased since prior study and is nonspecific. No hydronephrosis or ureteral stones. Urinary bladder unremarkable. GI AND BOWEL: Stomach demonstrates no acute abnormality. Sigmoid diverticulosis. There is no bowel obstruction. PERITONEUM AND RETROPERITONEUM: Trace free fluid in the cul de sac. No free air. VASCULATURE: Aorta is normal in caliber. Diffuse aortic and scattered coronary artery calcifications. Densely calcified mitral valve annulus. LYMPH NODES: No lymphadenopathy. REPRODUCTIVE ORGANS: Prior hysterectomy. No adnexal mass. BONES AND SOFT TISSUES: Postoperative and degenerative changes in the lumbar spine. No acute osseous abnormality. No focal soft tissue abnormality. IMPRESSION: 1. No acute findings in the abdomen or pelvis. 2. Coronary artery disease, aortic atherosclerosis. 3. Sigmoid diverticulosis.  Electronically signed by: Franky Crease MD  09/18/2024 03:48 PM EST RP Workstation: [id number]   DG Chest Portable 1 View Result Date: 09/18/2024 CLINICAL DATA:  Poor p.o. intake.  Diarrhea. EXAM: PORTABLE CHEST 1 VIEW COMPARISON:  08/01/2024 FINDINGS: Lungs are hypoinflated without airspace consolidation or effusion. Cardiomediastinal silhouette and remainder of the exam is unchanged. IMPRESSION: Hypoinflation without acute cardiopulmonary disease. Electronically Signed   By: Toribio Agreste M.D.   On: 09/18/2024 13:27    Microbiology: Recent Results (from the past 240 hours)  MRSA Next Gen by PCR, Nasal     Status: None   Collection Time: 09/18/24  2:34 PM   Specimen: Nasal Mucosa; Nasal Swab  Result Value Ref Range Status   MRSA by PCR Next Gen NOT DETECTED NOT DETECTED Final    Comment: (NOTE) The GeneXpert MRSA Assay (FDA approved for NASAL specimens only), is one component of a comprehensive MRSA colonization surveillance program. It is not intended to diagnose MRSA infection nor to guide or monitor treatment for MRSA infections. Test performance is not FDA approved in patients less than 33 years old. Performed at [facility], [address], [place], [place] [address]      Labs: Basic Metabolic Panel: Recent Labs  Lab 09/18/24 1206 09/18/24 1214 09/18/24 1557  NA 141 144 137  K 3.2* 3.1* 4.2  CL 119* 116* 107  CO2 13*  --  15*  GLUCOSE 178* 161* 269*  BUN 42* 47* 54*  CREATININE 1.29* 1.20* 1.62*  CALCIUM  6.1*  --  8.8*  MG 1.0*  --  2.0  PHOS  --   --  3.9   Liver Function Tests: Recent Labs  Lab 09/18/24 1206  AST 26  ALT 19  ALKPHOS 57  BILITOT 0.9  PROT 3.9*  ALBUMIN 2.1*   Recent Labs  Lab 09/18/24 1206  LIPASE 18   No results for input(s): AMMONIA in the last 168 hours. CBC: Recent Labs  Lab 09/18/24 1206 09/18/24 1214  WBC 12.5*  --   NEUTROABS 9.1*  --   HGB 10.0* 7.8*  HCT 31.8* 23.0*  MCV 93.5  --   PLT 181  --    Cardiac  Enzymes: No results for input(s): CKTOTAL, CKMB, CKMBINDEX, TROPONINI in the last 168 hours. D-Dimer No results for input(s): DDIMER in the last 72 hours. BNP: Invalid input(s): POCBNP CBG: Recent Labs  Lab 09/18/24 1612 09/19/24 0806  GLUCAP 235* 234*   Anemia work up No results for input(s): VITAMINB12, FOLATE, FERRITIN, TIBC, IRON, RETICCTPCT in the last 72 hours. Urinalysis    Component Value Date/Time   COLORURINE COLORLESS (A) 06/16/2024 1046   APPEARANCEUR CLEAR 06/16/2024 1046   LABSPEC <1.005 (L) 06/16/2024 1046   PHURINE 5.5 06/16/2024 1046   GLUCOSEU NEGATIVE 06/16/2024 1046   HGBUR NEGATIVE 06/16/2024 1046   BILIRUBINUR NEGATIVE 06/16/2024 1046   KETONESUR NEGATIVE 06/16/2024 1046   PROTEINUR NEGATIVE 06/16/2024 1046   UROBILINOGEN 0.2 10/20/2009 0813   NITRITE NEGATIVE 06/16/2024 1046   LEUKOCYTESUR NEGATIVE 06/16/2024 1046   Sepsis Labs Recent Labs  Lab 09/18/24 1206  WBC 12.5*     SIGNED:  Alm Apo, MD  [facility] 09/22/2024, 1:17 PM

## 2024-10-16 NOTE — Progress Notes (Incomplete)
 Palliative Medicine Progress Note   Patient Name: [name]       Date: 09/20/2024 DOB: 08/10/1928  Age: 89 y.o. MRN#: [id number] Attending Physician: Patsy Lenis, MD Primary Care Physician: Gerome Brunet, DO Admit Date: 09/18/2024  Reason for Consultation/Follow-up: {Reason for Consult:23484}  HPI/Patient Profile: 89 y.o. female  with past medical history of dementia (recently diagnosed), type 2 DM, osteoarthritis, remote colon adenoma, and HTN  who presented to the ED on 09/18/2024 after 3 days of diarrhea and poor p.o. intake.  She was found to have third-degree heart block with rate 20-30.  Workup also revealed significant hypomagnesemia, hypokalemia, hypocalcemia, as well as AKI.   Palliative Medicine has been consulted for goals of care discussions and complex medical decision making.   Subjective: Chart reviewed. Per MAR, in the past 24 hours patient received 2 mg IV morphine  x 2 doses and 2 mg IV versed  x 3 doses. Note pulse 48 this morning (improved from 20's yesterday).  Update received from RN. She states patient was asking for water earlier, but there is NPO order, so she gave her a swab with water and did note she had slight coughing. Will order comfort feeds and ice chips.   Patient assessed. She is sleeping and appears comfortable; I did not attempt to wake her. No family currently at bedside.   18:39 - I was notified by RN that patient's IV has infiltrated and IV team just attempted to place a new IV but was unsuccessful x 2 attempts. I have ordered oral concentrate solutions of oxycodone  and lorazepam .    Objective:  Physical Exam Vitals reviewed.  Constitutional:      General: She is sleeping. She is not in acute distress.    Appearance: She is ill-appearing.   Pulmonary:     Effort: No respiratory distress.  Skin:    General: Skin is warm and dry.             Vital Signs: BP (!) 147/105 (BP Location: Left Arm)   Pulse (!) 48   Temp (!) 97.3 F (36.3 C) (Axillary)   Resp 16   Ht 5' 1 (1.549 m)   Wt 55 kg   SpO2 98%   BMI 22.91 kg/m  SpO2: SpO2: 98 % O2 Device: O2 Device: Room Air O2 Flow Rate:  Intake/output summary:  Intake/Output Summary (Last 24 hours) at 09/20/2024 1148 Last data filed at 09/20/2024 0300 Gross per 24 hour  Intake 378.99 ml  Output --  Net 378.99 ml    LBM:       Palliative Assessment/Data: ***     Palliative Medicine Assessment & Plan   Assessment: Principal Problem:   Complete heart block (HCC) Active Problems:   Hypokalemia   HTN (hypertension)   DMII (diabetes mellitus, type 2) (HCC)   Hypomagnesemia   Anemia of chronic disease   Dementia without behavioral disturbance (HCC)   Hypocalcemia   AKI (acute kidney injury)    Recommendations/Plan: ***  Goals of Care and Additional Recommendations: Limitations on Scope of Treatment: {Recommended Scope and Preferences:21019}  Code Status:   Prognosis:  {Palliative Care Prognosis:23504}  Discharge Planning: {Palliative dispostion:23505}  Care plan was discussed with ***  Thank you for allowing the Palliative Medicine Team to assist in the care of this patient.   ***   Recardo KATHEE Loll, NP   Please contact Palliative Medicine Team phone at [phone] for questions and concerns.  For individual providers, please see [facility].

## 2024-10-16 NOTE — Plan of Care (Signed)
  Problem: Pain Managment: Goal: General experience of comfort will improve and/or be controlled Oct 01, 2024 1201 by Bernardo Boast, LPN Outcome: Progressing October 01, 2024 1159 by Bernardo Boast, LPN Outcome: Progressing   Problem: Safety: Goal: Ability to remain free from injury will improve 10/01/24 1201 by Bernardo Boast, LPN Outcome: Progressing 10-01-24 1159 by Bernardo Boast, LPN Outcome: Progressing

## 2024-10-16 DEATH — deceased
